# Patient Record
Sex: Female | Born: 1935 | Race: White | Hispanic: No | Smoking: Never smoker
Health system: Southern US, Community
[De-identification: ages and names within clinical notes are randomized; demographics above are authoritative.]

## PROBLEM LIST (undated history)

## (undated) DIAGNOSIS — I10 Essential (primary) hypertension: Secondary | ICD-10-CM

## (undated) DIAGNOSIS — M1711 Unilateral primary osteoarthritis, right knee: Secondary | ICD-10-CM

## (undated) DIAGNOSIS — M810 Age-related osteoporosis without current pathological fracture: Secondary | ICD-10-CM

## (undated) DIAGNOSIS — K219 Gastro-esophageal reflux disease without esophagitis: Secondary | ICD-10-CM

## (undated) DIAGNOSIS — M48061 Spinal stenosis, lumbar region without neurogenic claudication: Secondary | ICD-10-CM

## (undated) DIAGNOSIS — H409 Unspecified glaucoma: Secondary | ICD-10-CM

## (undated) DIAGNOSIS — F32A Depression, unspecified: Secondary | ICD-10-CM

## (undated) DIAGNOSIS — R42 Dizziness and giddiness: Secondary | ICD-10-CM

## (undated) DIAGNOSIS — F329 Major depressive disorder, single episode, unspecified: Secondary | ICD-10-CM

## (undated) HISTORY — PX: NECK SURGERY: SHX720

## (undated) HISTORY — PX: FRACTURE SURGERY: SHX138

## (undated) HISTORY — PX: EYE SURGERY: SHX253

## (undated) HISTORY — PX: HIP FRACTURE SURGERY: SHX118

## (undated) HISTORY — PX: ABDOMINAL HYSTERECTOMY: SHX81

---

## 1998-11-11 ENCOUNTER — Encounter: Payer: Self-pay | Admitting: Specialist

## 1998-11-14 ENCOUNTER — Ambulatory Visit (HOSPITAL_COMMUNITY): Admission: RE | Admit: 1998-11-14 | Discharge: 1998-11-14 | Payer: Self-pay | Admitting: Specialist

## 1999-05-15 ENCOUNTER — Other Ambulatory Visit: Admission: RE | Admit: 1999-05-15 | Discharge: 1999-05-15 | Payer: Self-pay | Admitting: Internal Medicine

## 2000-04-06 ENCOUNTER — Emergency Department (HOSPITAL_COMMUNITY): Admission: EM | Admit: 2000-04-06 | Discharge: 2000-04-06 | Payer: Self-pay | Admitting: Emergency Medicine

## 2000-04-06 ENCOUNTER — Encounter: Payer: Self-pay | Admitting: Emergency Medicine

## 2000-06-20 ENCOUNTER — Encounter: Admission: RE | Admit: 2000-06-20 | Discharge: 2000-09-18 | Payer: Self-pay | Admitting: Anesthesiology

## 2001-05-05 ENCOUNTER — Encounter: Payer: Self-pay | Admitting: Emergency Medicine

## 2001-05-05 ENCOUNTER — Encounter: Payer: Self-pay | Admitting: Internal Medicine

## 2001-05-05 ENCOUNTER — Inpatient Hospital Stay (HOSPITAL_COMMUNITY): Admission: EM | Admit: 2001-05-05 | Discharge: 2001-05-06 | Payer: Self-pay | Admitting: Emergency Medicine

## 2002-12-01 ENCOUNTER — Encounter: Payer: Self-pay | Admitting: Internal Medicine

## 2002-12-01 ENCOUNTER — Encounter: Admission: RE | Admit: 2002-12-01 | Discharge: 2002-12-01 | Payer: Self-pay | Admitting: Internal Medicine

## 2002-12-04 ENCOUNTER — Encounter: Payer: Self-pay | Admitting: Internal Medicine

## 2002-12-04 ENCOUNTER — Encounter: Admission: RE | Admit: 2002-12-04 | Discharge: 2002-12-04 | Payer: Self-pay | Admitting: Internal Medicine

## 2003-01-13 ENCOUNTER — Encounter: Payer: Self-pay | Admitting: General Surgery

## 2003-01-13 ENCOUNTER — Ambulatory Visit (HOSPITAL_COMMUNITY): Admission: RE | Admit: 2003-01-13 | Discharge: 2003-01-13 | Payer: Self-pay | Admitting: General Surgery

## 2003-01-27 ENCOUNTER — Encounter: Payer: Self-pay | Admitting: *Deleted

## 2003-01-27 ENCOUNTER — Encounter: Admission: RE | Admit: 2003-01-27 | Discharge: 2003-01-27 | Payer: Self-pay | Admitting: *Deleted

## 2003-09-04 ENCOUNTER — Inpatient Hospital Stay (HOSPITAL_COMMUNITY): Admission: EM | Admit: 2003-09-04 | Discharge: 2003-09-05 | Payer: Self-pay | Admitting: Emergency Medicine

## 2003-09-10 ENCOUNTER — Emergency Department (HOSPITAL_COMMUNITY): Admission: EM | Admit: 2003-09-10 | Discharge: 2003-09-11 | Payer: Self-pay | Admitting: *Deleted

## 2005-08-07 ENCOUNTER — Encounter: Admission: RE | Admit: 2005-08-07 | Discharge: 2005-08-07 | Payer: Self-pay | Admitting: Internal Medicine

## 2006-11-19 ENCOUNTER — Inpatient Hospital Stay (HOSPITAL_COMMUNITY): Admission: RE | Admit: 2006-11-19 | Discharge: 2006-11-23 | Payer: Self-pay | Admitting: Obstetrics and Gynecology

## 2006-11-19 ENCOUNTER — Encounter (INDEPENDENT_AMBULATORY_CARE_PROVIDER_SITE_OTHER): Payer: Self-pay | Admitting: Specialist

## 2006-11-25 ENCOUNTER — Observation Stay (HOSPITAL_COMMUNITY): Admission: AD | Admit: 2006-11-25 | Discharge: 2006-11-26 | Payer: Self-pay | Admitting: Obstetrics and Gynecology

## 2007-01-16 ENCOUNTER — Emergency Department (HOSPITAL_COMMUNITY): Admission: EM | Admit: 2007-01-16 | Discharge: 2007-01-16 | Payer: Self-pay | Admitting: Emergency Medicine

## 2008-01-27 ENCOUNTER — Ambulatory Visit: Payer: Self-pay | Admitting: Vascular Surgery

## 2008-03-31 ENCOUNTER — Ambulatory Visit: Payer: Self-pay | Admitting: Vascular Surgery

## 2008-09-29 ENCOUNTER — Ambulatory Visit: Payer: Self-pay | Admitting: Vascular Surgery

## 2009-01-06 ENCOUNTER — Encounter: Admission: RE | Admit: 2009-01-06 | Discharge: 2009-01-06 | Payer: Self-pay | Admitting: Internal Medicine

## 2009-02-01 ENCOUNTER — Ambulatory Visit (HOSPITAL_COMMUNITY): Admission: RE | Admit: 2009-02-01 | Discharge: 2009-02-01 | Payer: Self-pay | Admitting: Internal Medicine

## 2009-12-27 ENCOUNTER — Emergency Department (HOSPITAL_COMMUNITY)
Admission: EM | Admit: 2009-12-27 | Discharge: 2009-12-27 | Payer: Self-pay | Source: Home / Self Care | Admitting: Emergency Medicine

## 2010-07-12 ENCOUNTER — Inpatient Hospital Stay (HOSPITAL_COMMUNITY)
Admission: EM | Admit: 2010-07-12 | Discharge: 2010-07-17 | Payer: Self-pay | Source: Home / Self Care | Attending: Internal Medicine | Admitting: Internal Medicine

## 2010-07-12 LAB — BASIC METABOLIC PANEL
BUN: 13 mg/dL (ref 6–23)
CO2: 27 mEq/L (ref 19–32)
Calcium: 8.8 mg/dL (ref 8.4–10.5)
Chloride: 100 mEq/L (ref 96–112)
Creatinine, Ser: 0.81 mg/dL (ref 0.4–1.2)
GFR calc Af Amer: >60 mL/min (ref >60)
GFR calc non Af Amer: >60 mL/min (ref >60)
Glucose, Bld: 106 mg/dL — ABNORMAL HIGH (ref 70–99)
Potassium: 4.3 mEq/L (ref 3.5–5.1)
Sodium: 135 mEq/L (ref 135–145)

## 2010-07-12 LAB — CBC
HCT: 35 % — ABNORMAL LOW (ref 36.0–46.0)
Hemoglobin: 11.4 g/dL — ABNORMAL LOW (ref 12.0–15.0)
MCH: 30 pg (ref 26.0–34.0)
MCHC: 32.6 g/dL (ref 30.0–36.0)
MCV: 92.1 fL (ref 78.0–100.0)
Platelets: 273 10*3/uL (ref 150–400)
RBC: 3.8 MIL/uL — ABNORMAL LOW (ref 3.87–5.11)
RDW: 13.8 % (ref 11.5–15.5)
WBC: 6 10*3/uL (ref 4.0–10.5)

## 2010-07-12 LAB — URINALYSIS, ROUTINE W REFLEX MICROSCOPIC
Bilirubin Urine: NEGATIVE
Hemoglobin, Urine: NEGATIVE
Ketones, ur: NEGATIVE mg/dL
Nitrite: NEGATIVE
Protein, ur: NEGATIVE mg/dL
Specific Gravity, Urine: 1.007 (ref 1.005–1.030)
Urine Glucose, Fasting: NEGATIVE mg/dL
Urobilinogen, UA: 0.2 mg/dL (ref 0.0–1.0)
pH: 7.5 (ref 5.0–8.0)

## 2010-07-13 LAB — BASIC METABOLIC PANEL
BUN: 11 mg/dL (ref 6–23)
CO2: 26 mEq/L (ref 19–32)
Calcium: 8.8 mg/dL (ref 8.4–10.5)
Chloride: 104 mEq/L (ref 96–112)
Creatinine, Ser: 0.79 mg/dL (ref 0.4–1.2)
GFR calc Af Amer: >60 mL/min (ref >60)
GFR calc non Af Amer: >60 mL/min (ref >60)
Glucose, Bld: 101 mg/dL — ABNORMAL HIGH (ref 70–99)
Potassium: 4.1 mEq/L (ref 3.5–5.1)
Sodium: 137 mEq/L (ref 135–145)

## 2010-07-13 LAB — IRON AND TIBC
Iron: 30 ug/dL — ABNORMAL LOW (ref 42–135)
Saturation Ratios: 9 % — ABNORMAL LOW (ref 20–55)
TIBC: 333 ug/dL (ref 250–470)
UIBC: 303 ug/dL

## 2010-07-13 LAB — FOLATE: Folate: >20 ng/mL

## 2010-07-13 LAB — CBC
HCT: 34 % — ABNORMAL LOW (ref 36.0–46.0)
Hemoglobin: 10.8 g/dL — ABNORMAL LOW (ref 12.0–15.0)
MCH: 29.2 pg (ref 26.0–34.0)
MCHC: 31.8 g/dL (ref 30.0–36.0)
MCV: 91.9 fL (ref 78.0–100.0)
Platelets: 258 10*3/uL (ref 150–400)
RBC: 3.7 MIL/uL — ABNORMAL LOW (ref 3.87–5.11)
RDW: 13.6 % (ref 11.5–15.5)
WBC: 5.1 10*3/uL (ref 4.0–10.5)

## 2010-07-13 LAB — FERRITIN: Ferritin: 13 ng/mL (ref 10–291)

## 2010-07-13 LAB — VITAMIN B12: Vitamin B-12: 253 pg/mL (ref 211–911)

## 2010-07-30 ENCOUNTER — Encounter: Payer: Self-pay | Admitting: Internal Medicine

## 2010-09-24 LAB — CBC
HCT: 35.1 % — ABNORMAL LOW (ref 36.0–46.0)
Hemoglobin: 11.4 g/dL — ABNORMAL LOW (ref 12.0–15.0)
MCHC: 32.4 g/dL (ref 30.0–36.0)
MCV: 92.1 fL (ref 78.0–100.0)
RBC: 3.81 MIL/uL — ABNORMAL LOW (ref 3.87–5.11)
WBC: 7.4 10*3/uL (ref 4.0–10.5)

## 2010-09-24 LAB — DIFFERENTIAL
Eosinophils Absolute: 0.2 10*3/uL (ref 0.0–0.7)
Lymphs Abs: 1 10*3/uL (ref 0.7–4.0)
Monocytes Absolute: 0.6 10*3/uL (ref 0.1–1.0)
Monocytes Relative: 8 % (ref 3–12)
Neutrophils Relative %: 76 % (ref 43–77)

## 2010-09-24 LAB — BASIC METABOLIC PANEL
CO2: 28 mEq/L (ref 19–32)
Chloride: 104 mEq/L (ref 96–112)
GFR calc Af Amer: >60 mL/min (ref >60)
Potassium: 4.6 mEq/L (ref 3.5–5.1)
Sodium: 138 mEq/L (ref 135–145)

## 2010-11-21 NOTE — Assessment & Plan Note (Signed)
OFFICE VISIT   Yvette Carroll, Yvette Carroll  DOB:  1919/10/14                                       03/31/2008  ZOXWR#:[id number]   The patient is an 77 year old female sent by Dr. Leeanne Deed for evaluation  of lower extremity occlusive disease.  She states that recently her  right foot has been getting numb when she walks.  She also states that  her left calf hurts all the time.  This occurs whether it is at rest or  with ambulation.  She recently had her left heel injected by Dr. Leeanne Deed  for plantar fasciitis.  He has told her not to ambulate for a while as  this heals, and she can ride a bike instead.  Her atherosclerotic risk  factors include age, hypertension.  She denies any history of tobacco  abuse.   PAST SURGICAL HISTORY:  She had a hysterectomy, bladder tacking, colon  repair, and back surgeries.   She states she also has residual spinal stenosis.   MEDICATIONS:  Include Azopt, Lumigan, tramadol, Norvasc, Betimol,  Prilosec, Ultracet and Antivert.   She is allergic to penicillin which causes swelling.  Mycin causes  diarrhea and Betadine, which causes a rash.   Past medical history is as listed above.   FAMILY HISTORY:  Unremarkable.   SOCIAL HISTORY:  She is widowed has two children.  She is a nonsmoker  not consume alcohol.   REVIEW OF SYSTEMS:  She is 5 feet, 136 pounds.  CARDIAC:  She cannot climb 2 flights of stairs without becoming short of  breath.  PULMONARY:  She has some seasonal allergies.  GI:  She has irritable bowel syndrome and chronic abdominal pain.  GU:  Unremarkable.  VASCULAR:  She denies history stroke or TIA.  NEUROLOGIC:  She has occasional dizziness.  ORTHOPEDIC:  She has multiple joint arthritis.  Psychiatric, hematologic are negative.  ENT:  She has never had a glaucoma.   Of note, she is also [facility] for her  spinal stenosis.   PHYSICAL EXAM:  Blood pressure 139/77 in the left arm,  heart rate 76 and  regular.  HEENT:  Unremarkable.  She has 2+ carotid pulses without  bruit.  Chest:  Clear to auscultation.  Cardiac exam is regular rate  rhythm without murmur.  Abdomen is soft, nontender, nondistended with no  masses.  Extremities:  She has 2+ carotid, radial, femoral, popliteal  pulses bilaterally.  In the left leg, she has a 2+ left posterior tibial  pulse with absent dorsalis pedis pulse.  In the right foot, she has  absent dorsalis pedis and posterior tibial pulse.   On July 21, this showed an ABI on the right side of 0.93 and on the left  of 0.96.   In summary, the patient does have some mild evidence of arterial  occlusive disease of the right leg with absent pedal pulses.  However,  her ABIs are 0.9 on this side and this should be adequate perfusion to  not put her at any risk of limb loss.  She has no open wounds on the  foot currently.  She currently is limited more by her degenerative joint  disease than by her vascular occlusive disease as far as walking and  lifestyle is concerned.  I believe the best option for  her is continued  risk factor management and observation.  She will follow up for repeat  ABIs in 6 months' time.   Janetta Hora. Fields, MD  Electronically Signed   CEF/MEDQ  D:  04/01/2008  T:  04/01/2008  Job:  [id number]   cc:   Fanny Bien. Tuchman, D.P.M.

## 2010-11-21 NOTE — Op Note (Signed)
NAME:  Yvette Carroll, Yvette Carroll NO.:  [id number]   MEDICAL RECORD NO.:  [id number]          PATIENT TYPE:  INP   LOCATION:  9307                          FACILITY:  [facility]   PHYSICIAN:  Michelle L. Grewal, M.D.DATE OF BIRTH:  04/10/1920   DATE OF PROCEDURE:  11/19/2006  DATE OF DISCHARGE:  11/23/2006                               OPERATIVE REPORT   PREOPERATIVE DIAGNOSES:  1. Pelvic relaxation.  2. Stress urinary incontinence.   POSTOPERATIVE DIAGNOSES:  1. Pelvic relaxation.  2. Stress urinary incontinence.   PROCEDURES:  1. Total vaginal hysterectomy.  2. Anterior repair.  3. Replacement of graft.  4. Cystoscopy.  5. Posterior repair.  6. Sacrospinous ligament fixation of vaginal vault.   SURGEONS:  Dr. Marcelle Overlie and Dr. Alfredo Martinez.   ESTIMATED BLOOD LOSS:  300 mL.   PATHOLOGY:  Uterus and cervix.   COMPLICATIONS:  None.   PACKS AND DRAINS:  Foley and vaginal packing.   PROCEDURE:  The patient was taken to the operating room after informed  consent was obtained.  She was then prepped and draped in the usual  sterile fashion.  A Foley catheter was inserted.  Exam under anesthesia  revealed a grade III uterine prolapse with significant cystocele and  rectocele. Uterus was small.  A weighted speculum was placed in the  vagina.  A circumferential incision was made around the cervix and the  posterior cul-de-sac was entered sharply using Mayo scissors.  The  anterior cul-de-sac was entered using Metzenbaum scissors as well.  We  then placed curved Heaney clamps across the uterosacral cardinal  ligaments on either side.  Each pedicle was cut and suture ligated using  0 Vicryl suture.  Once we reached the level of the triple pedicle.  The  uterus was retroflexed and the broad ligament was clamped on either side  using curved Heaney clamps.  The specimen was removed.  The pedicles  were secured using a free tie of 0 Vicryl suture and a suture ligature  of 0 Vicryl suture.  There was no bleeding whatsoever.  I then placed a  McCall cul-de-sac stitch in the usual fashion using 0 Vicryl suture.  We  then closed the posterior two thirds of the cuff in a running stitch  using 2-0 Vicryl suture.  At this point, Dr. Sherron Monday became the  primary surgeon and I assisted him and he performed briefly an anterior  repair, replacement of the graft and cystoscopy.  This portion of the  surgery will be dictated by him.  At the end of that, he then closed  with the vaginal cuff completely.  At this point, we then switched  placed it became the primary surgeon for the posterior repair and  sacrospinous ligament fixation where a V-shaped incision was made in the  perineum, a midline incision was made all the way up the posterior wall  of the vagina and the rectocele was reduced using sharp and blunt  dissection.  The rectocele was reduced using 0 Vicryl interrupted.  I  also used the Capio device with Ethibond  and then placed a stitch  through the right sacrospinous ligament and fixated it to the vaginal  vault with excellent placement.  We then trimmed off the excessive  vaginal epithelium and then I closed the posterior vaginal wall.  After  closing the distal third, I then tied down the sacrospinous stitch with  excellent fixation of the vaginal vault and excellent suspension.  I  then closed the remainder of the vaginal wall using 0 Vicryl suture and  closed the perineum was well.  At the end of the procedures, packing  with Estrace cream was inserted into the vagina.  All sponge, lap and  instrument counts were correct x2.  The patient went to recovery room in  stable condition.   ANESTHESIA:  Spinal.      Michelle L. Vincente Poli, M.D.  Electronically Signed     MLG/MEDQ  D:  12/03/2006  T:  12/03/2006  Job:  [id number]   cc:   Martina Sinner, MD  Fax: [phone]

## 2010-11-21 NOTE — Op Note (Signed)
NAME:  Yvette Carroll, Yvette Carroll NO.:  [id number]   MEDICAL RECORD NO.:  [id number]          PATIENT TYPE:  AMB   LOCATION:  SDC                           FACILITY:  [facility]   PHYSICIAN:  Martina Sinner, MD DATE OF BIRTH:  1920/01/06   DATE OF PROCEDURE:  11/19/2006  DATE OF DISCHARGE:                               OPERATIVE REPORT   PREOPERATIVE DIAGNOSIS:  Ureteral vaginal prolapse; cystocele;  rectocele.   SURGERY:  Cystocele repair plus four-corner graft plus cystoscopy (Dr.  Lorin Picket MacDiarmid; assistant Dr. Vincente Poli); transvaginal hysterectomy plus  posterior repair plus sacrospinous vault suspension (Dr. Marcelle Overlie; assistant Dr. Sherron Monday).    [name] has vault prolapse to a mild to moderate degree with a  symptomatic cystocele and a mild posterior defect.   She was prepped and draped in the usual fashion under spinal anesthetic.  Preoperative laboratory work was within normal limits.   The patient was given preoperative antibiotics.   Transvaginal hysterectomy was performed by Dr. Marcelle Overlie, and this  will be dictated by her.  The following the hysterectomy, there was no  bleeding.  I placed two Allis clamps at the cuff just lateral to the  midline anteriorly.  I instilled approximately 18 mL of a lidocaine  epinephrine mixture.  I made a long midline incision between Allis  clamps and dissected the pubocervical fascia from the vaginal wall to  the white line bilaterally.  I was careful not to hypermobilize the dome  of the bladder.  I did a two-layer anterior repair with running 2-0  Vicryl on a SH needle.  I then cystoscoped the patient.  There was  efflux of indigo carmine dye from both ureteral orifices, and there was  no change in distortion of the anatomy.   I then did a four-corner repair by placing a 2-0 Vicryl on UR6 near the  urethrovesical angle and the pelvic sidewall.  I tried several times  with a Capio device to place it on  the ischial spine which was easily  identified with blunt and sharp dissection, but I was not able to make  it fire.  For this reason, I used UR6 on a 2-0 Vicryl and placed it into  the ileal coccygeus muscle approximately 1 cm below and 1 to 1.5 cm  caudal to the ischial spine.  I was happy with the placement of the 4  sutures.   I then cut a 10 x 6 dermal graft to approximately 8 x 8 cm x 4-cm  trapezoid.  This was sewn into the 4 sutures placed above.  It fit  nicely and supported the graft.  I did not perform a sling.  I trimmed a  few millimeters of vaginal mucosa bilaterally and closed the vaginal  wall anteriorly with running 2-0 Vicryl on a CT-1 needle.  Estimated  blood loss was less than 30 mL.   Dr. Marcelle Overlie then did a cul-de-plasty plus a sacrospinous  fixation and posterior repair.   Vaginal pack was inserted.  Estimated blood loss was less than 100  mL.  Leg position was good at the end of the case.   Catheter was draining blue urine at the end of the case.   At the of the case, Yvette Carroll vaginal length was good.  She did not  have a lot of narrowing.  She does have a narrow pubic bone.  Hopefully,  these procedures will reach her treatment goal, and overall, we were  both very pleased with the operation.           ______________________________  Martina Sinner, MD  Electronically Signed     SAM/MEDQ  D:  11/19/2006  T:  11/19/2006  Job:  [id number]

## 2010-11-21 NOTE — Procedures (Signed)
EEG NUMBER:  [id number]   HISTORY:  This is an 77 year old patient with history of dizziness,  blacking out episodes, being forgetful, balance issues, and vertigo.  The patient is being evaluated for the above.  This is a routine EEG.  No skull defects noted.   MEDICATIONS:  Plavix and eye drops of some sort.   EEG CLASSIFICATION:  Dysrhythmia grade 1, generalized.   DESCRIPTION OF RECORDING:  Background rhythm of this recording consists  of a somewhat poorly modulated low-amplitude 7-Hz background activity.  As the record progresses, significant degree of muscle artifact that  seems to overlie most of the recording.  Photic stimulation is performed  and results in a symmetric photic drive response.  Hyperventilation is  also performed resulting in a minimal buildup background activity  without significant slowing seen.  At no time during the recording,  there appeared to be evidence of spike, spike wave discharges, or  evidence of focal slowing.  EKG monitor shows no evidence of cardiac  rhythm abnormalities with a heart rate of 90.   IMPRESSION:  This is a minimally abnormal EEG recording due to mild  symmetric background slowing.  This is a nonspecific recording and can  be seen with any process of results in a mild toxic or metabolic  encephalopathy or any dementing-type illness.  No epileptiform  discharges were seen at any time.      Marlan Palau, M.D.  Electronically Signed     NFA:OZHY  D:  02/01/2009 14:17:44  T:  02/02/2009 03:28:41  Job #:  [id number]

## 2010-11-24 NOTE — Op Note (Signed)
[facility]  Patient:    Yvette Carroll, Yvette Carroll                           MRN: [id number] Proc. Date: 06/20/00 Attending:  Thyra Carroll, M.D.                           Operative Report  DATE OF BIRTH:  [birth year]  DIAGNOSIS:  Lumbar spinal stenosis with underlying lumbar spondylosis and degenerative disk disease with spinal stenosis and encroachment on the right L4 nerve root on MRI and left L5.  PROCEDURE:  Lumbar epidural steroid injection.  ANESTHESIOLOGIST:  Yvette Carroll, M.D.  HISTORY:  Yvette Carroll is a very pleasant 77 year old who was sent to [place] by Dr. Onalee Carroll for a series of lumbar epidural steroid injections.  The patient states that she was in her usual state of health which was characterized by recurrent back pain for several years but over the past two to three years she has had progressively increasing discomfort radiating out into the right lower extremity.  Approximately two to three weeks ago it became severe in intensity.  She came under Dr. Brunetta Carroll care after an injury to her left foot five weeks ago which required surgical intervention four weeks ago.  She is recovering nicely from this.  Since then, she has had sharp intermittent pain radiating from the lower back, at approximately the right L5-S1 facet joint region out into the lateral aspect of her right calf and thigh to her foot.  It is associated with numbness and tingling but no focal weakness and no bowel or bladder incontinence.  It is made worse by standing, bending and vacuuming and improved by walking.  Heat improves it also.  She has been treated with Mobic.  An MRI was obtained at [facility] on May 20, 2000 which showed lumbar spinal stenosis moderate at L4-5 with multilevel facet joint arthropathy, disk bulges and to a lesser extent a herniated disk at L5-S1.  She is sent for a series of epidural steroid injections at this time.  CURRENT MEDICATIONS:   Mobic, Norvasc, ______, Timoptic and Pepcid.  ALLERGIES:  BETADINE, PENICILLIN, and MYCINS.  FAMILY HISTORY:  Positive for cancer.  Graves disease.  Huntingtons chorea.  ACTIVE MEDICAL PROBLEMS:  Hypertension, glaucoma, osteoarthritis, recurrent vertigo with tinnitus, gastroesophageal reflux disease and allergic rhinitis.  PAST SURGICAL HISTORY:  Cervical disk surgery with fusion by Dr. Barnett Carroll and repair of foot.  SOCIAL HISTORY:  The patient is a nonsmoker, nondrinker.  She was employed in [employer] and retired four years ago.  REVIEW OF SYSTEMS:  General: Significant for cold intolerance, otherwise negative.  Head:  Negative.  Eyes significant for glaucoma and cataract surgery.  She currently is having some difficulties with her right eye.  She is to seen an eye physician next week.  ENT: Significant for dry mouth.  Ears significant for vertigo and ringing in the ears.  Pulmonary negative. Cardiovascular:  Significant for a heart murmur and chronic hypertension. Gastrointestinal:  Significant for gastroesophageal reflux disease when she takes the Pepcid or Zantac.  Genitourinary significant for frequency but no burning, fevers, kidney stones or hematuria.   Musculoskeletal/neurological: See HPI.  No history of seizures or strokes.  Cutaneous:  Significant for dry skin.  Hematologic negative.  Endocrine significant for previous history of taking thyroid replacement, currently with some cold intolerance.  Psychiatric significant for some  mild seasonal blues.  Allergies:  The patient used to take allergy shots for 15 years but has recently gone off those.  She has allergies to pollens.  PHYSICAL EXAMINATION:  VITAL SIGNS:  Blood pressure 143/65, heart rate 82, respirations 18, O2 saturation 98%.  Her pain level is 4/10.  GENERAL:  This is a very pleasant female who looks her stated age.  HEENT:  Normocephalic, atraumatic.  Eyes: Extraocular movements intact with conjunctivae  and sclerae clear.  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  Nose patent.  Ears patent.  Oropharynx demonstrated good dentition.  NECK:  The neck demonstrated fairly marked restriction in range of motion of her neck with carotids 2+ and symmetric without bruits.  There is no lymphadenopathy.  LUNGS:  Clear.  HEART:  Regular rate and rhythm with a grade 2/6 systolic/decrescendo murmur.  BREASTS/PELVIC/RECTAL/ABDOMEN:  Exams not performed.  BACK:  Exam revealed tenderness over the right S1 joint region with mildly positive straight leg raise sign on the right side.  EXTREMITIES:  The patient demonstrated a healing surgical scar of the left foot.  She had some bony enlargement of the DIPs, PIPs, and first carpal/metacarpal joints as well as the first MTPs of her feet.  Radial pulses and dorsalis pedis pulses are 1-2+ and symmetrical.  NEUROLOGICAL:  The patient was oriented x 4.  Cranial nerves II-XII were grossly intact.  Deep tendon reflexes were symmetric in the upper and lower extremity with downgoing toes.  There is no evidence of clonus.  Motor was 5/5 with symmetric bulk and tone.  Coordination was grossly intact.  Sensation to vibratory sense was attenuated bilaterally in the lower extremities.  Pin prick was globally decreased in a nondermatomal distribution in the right lower extremity.  IMPRESSION: 1. Low back pain which is intermittent and progressive, chronically on the    basis of her lumbar spondylosis with more recent symptom probably    reflective of her right L4 radiculopathy.  She has minimal    pseudoclaudication. 2. Other medical problems per Dr. Lendell Carroll which include hypertension,    osteoarthritis, vertigo, tinnitus, gastroesophageal reflux disease,    and allergic rhinitis. 3. Glaucoma/cataracts per eye physician.  DISPOSITION:  I discussed the potential risks, benefits and limitations of a lumbar epidural steroid injection in detail with the  patient.  I reviewed the side effects of corticosteroids with her in detail.  She wishes to proceed.   DESCRIPTION OF PROCEDURE:  After informed consent was obtained, the patient was placed in the sitting position and monitored.  Her back was prepped with Betadine x 3.  A skin wheal was raised at the L4-5 interspace with 1% lidocaine.  A 20 gauge Tuohy needle was introduced through the lumbar epidural space to a loss of resistance to preservative free normal saline. There was no CSF nor blood.  Then 40 mg of Medrol and 5 ml of preservative free normal saline was gently injected.  The needle was flushed with preservative free normal saline and removed intact.  Postprocedure condition:  Stable.  DISCHARGE INSTRUCTIONS: 1. Resume previous diet. 2. Limitation of activities per instruction sheet. 3. Continue on current medications. 4. Follow up with me next week for repeat injection. DD:  06/20/00 TD:  06/20/00 Job: [id number]

## 2010-11-24 NOTE — Consult Note (Signed)
NAME:  Yvette Carroll, Yvette Carroll                            ACCOUNT NO.:  [id number]   MEDICAL RECORD NO.:  [id number]                   PATIENT TYPE:  EMS   LOCATION:  ED                                   [facility]   PHYSICIAN:  Renato Battles, M.D.                  DATE OF BIRTH:  1920-04-20   DATE OF CONSULTATION:  09/11/2003  DATE OF DISCHARGE:  09/11/2003                                   CONSULTATION   HISTORY OF PRESENT ILLNESS:  The patient is an 77 year old white female with  generalized abdominal pain.  No nausea or vomiting.  The patient reports  having diarrhea for the last week or so.  She took Imodium two days ago and  had no bowel movement since.  The patient was admitted for similar  complaints, and had negative abdominal CT scan except for diverticulosis.  Discharged five days ago.   REVIEW OF SYSTEMS:  CONSTITUTIONAL:  No fever, chills, or night sweats.  Positive for vertigo.  CARDIAC:  No chest pain, shortness of breath, or  cough.  GASTROINTESTINAL:  Positive for constipation.  No nausea or  vomiting.  GENITOURINARY:  No hematuria, dysuria, or urinary retention.   PAST MEDICAL HISTORY:  1. Hypertension.  2. Glaucoma.  3. Mild gastroparesis.  4. __________.   FAMILY HISTORY:  Positive for breast cancer and diabetes.   SOCIAL HISTORY:  Negative.   ALLERGIES:  1. MYCINS.  2. PENICILLIN.   MEDICATIONS:  1. Amlodipine.  2. __________.  3. Antivert.   PHYSICAL EXAMINATION:  GENERAL:  Alert and oriented x3.  In no acute  distress.  VITAL SIGNS:  Temperature 98, heart rate 91, respiratory rate 18, blood  pressure 113/60.  HEENT:  Head is normocephalic, atraumatic.  Pupils equal, round, reactive to  light and accommodation.  NECK:  No lymphadenopathy, no thyromegaly, no JVD.  CHEST:  Clear to auscultation bilaterally.  No wheezes, rhonchi, or rales.  HEART:  Regular rate and rhythm.  No murmurs, rubs, or gallops.  ABDOMEN:  Soft, mild generalized tenderness.  No  distention, decreased bowel  sounds.  EXTREMITIES:  No cyanosis, clubbing, or edema.  NEUROLOGIC:  Negative.   LABORATORY DATA:  Glucose 125, otherwise electrolytes were normal.  Liver  function tests were normal.  CBC showed no abnormalities.  Abdominal acute  series was negative with increased stools.   ASSESSMENT AND PLAN:  This is most likely abdominal pain secondary to  constipation.  No impaction.  Plan to discharge the patient to home with a  few days of Colace.  Additionally, she was recommended to start on Metamucil  on a daily basis, otherwise, we are not going to change her medications.   PRIMARY CARE PHYSICIAN:  Janae Bridgeman. Lendell Caprice, M.D.  Renato Battles, M.D.    SA/MEDQ  D:  09/11/2003  T:  09/11/2003  Job:  [id number]

## 2010-11-24 NOTE — H&P (Signed)
NAME:  Yvette Carroll, Yvette Carroll                            ACCOUNT NO.:  [id number]   MEDICAL RECORD NO.:  [id number]                   PATIENT TYPE:  EMS   LOCATION:  ED                                   FACILITY:  [facility]   PHYSICIAN:  Luis A. Delaney Meigs, M.D.                DATE OF BIRTH:  01-May-1920   DATE OF ADMISSION:  09/04/2003  DATE OF DISCHARGE:                                HISTORY & PHYSICAL   CHIEF COMPLAINT:  Vomiting, abdominal cramps.   HISTORY OF PRESENT ILLNESS:  This is a delightful 77 year old white female  with a past medical history of __________ glaucoma and hypertension who  presents with about 24 hours of abdominal cramping, vomiting and more  recently diarrhea. The patient states that approximately one week prior to  admission, she developed symptoms of an upper respiratory tract infection  consisting of productive cough, sore throat. For this she was prescribed  levofloxacin by and outside physician. Symptoms of upper respiratory tract  infection have since resolved.   REVIEW OF SYMPTOMS:  As in HPI.  PULMONARY:  Some shortness of breath and  wheezing, upper respiratory tract infection otherwise negative.  CARDIAC:  No chest pain, no palpitations, no dyspnea on exertion.  ENDOCRINE:  No  polyuria, polydipsia, no heat or cold intolerance.  MUSCULOSKELETAL:  Complains to chronic back and neck pain.  GASTROINTESTINAL:  As in history  of present illness.  Denies hematochezia, melena or hematemesis.  PSYCHIATRIC:  Denies hallucinations, suicidal or homicidal ideation.  NEUROLOGIC:  Denies syncope or presyncope but admits to some chronic  vertigo.   PAST MEDICAL HISTORY:  As in history of present illness.  Osteoarthritis of  the back and neck.  Probable delayed gastric emptying based on gastric  emptying study of January 13, 2003.   FAMILY HISTORY:  Positive for breast cancer and diabetes mellitus.   ALLERGIES:  The patient states she is allergic to [facility] and PENICILLIN.   SOCIAL HISTORY:  The patient lives alone, denies tobacco, alcohol or drug  use.   CURRENT MEDICATIONS:  Three types of eye drops of which she is uncertain of  the names for glaucoma, __________ levofloxacin, amlodipine and Ultracet  p.r.n. for back pain.   PHYSICAL EXAMINATION:  GENERAL:  Reveals an elderly white female in no acute  distress.  VITAL SIGNS:  Blood pressure 116/66 lying, systolic blood pressure drops to  104 on sitting.  Heart rate is 70 beats per minute lying and increases to 95  upon sitting. Temperature is 97.  The patient is sating 100% room air.  HEENT:  Reveals no scleral icterus.  Oral examination is unremarkable.  Pharynx is without erythema or exudate. Oral mucosa appears dry.  Neck  reveals no JVD, no lymphadenopathy, no thyroid masses and no tracheal  deviation.  BREASTS:  Reveals no lymphadenopathy, no nipple discharge, no masses.  No  pau d'orange.  CARDIOVASCULAR:  Reveals a soft systolic ejection murmur best heard at the  right upper sternal border.  No gallops or rubs are auscultated.  LUNGS:  Clear to auscultation bilaterally.  No signs of consolidation noted.  ABDOMEN:  Soft with normal active bowel sounds.  RECTAL:  Reveals an external hemorrhoid but stool is guaiac negative.  EXTREMITIES:  Reveal no cyanosis, clubbing or edema.  NEUROLOGIC:  Without sensory or motor deficit.  Cranial nerves were grossly  intact.   DIAGNOSTIC STUDIES:  CT of the abdomen reveals diverticulosis without the  presence of diverticulitis.  CBC with DIF reveals a white count of 16.6 with  an H&H of 14 and 40, MCV of 90, platelets 360.  There is reported greater  than 20% bands.  Complete metabolic panel reveals a sodium of 136, potassium  3.8, chloride 106, bicarbonate of 26, glucose of 152, BUN 15, creatinine  0.8, calcium 8.7, total protein 6.8, albumin 4, AST 24, ALT 15.  Alkaline  phosphatase 102, total bilirubin 0.6.  Urinalysis is negative.   ASSESSMENT/PLAN:  1.  An 77 year old white female with above medical history and presenting     complaints. The patient's symptoms raise concern for clostridium     difficile given the history of recent antibiotic or antimicrobial     exposure and abdominal complaints, diarrhea and leukocytosis.  Will send     stool for C Dif and fecal leukocytes and treat appropriately if positive.     Will also obtain blood cultures although patient is not clinically     showing signs of infection.  2. Volume depletion.  The patient is mildly orthostatic. Will replete volume     at 100 mL an hour normal saline.  Will also use metoclopramide low doses     as an antiemetic.  3. Increase random glucose.  The patient probably has impaired glucose     tolerance if not incipient over diabetes given the patient's family     history and random glucose of 152 mg per deciliter. Will check hemoglobin     A1C, fasting lipid panel and check blood glucose a.c. and h.s.  4. Glaucoma.  I have spoken to the patient's daughter on the phone and have     requested for her to bring her glaucoma eye drops as she is unable to     recall which ones she is currently on.  She will continue home regimen of     these.  5. Hypertension.  The patient currently volume depleted and orthostatic.     Will hold amlodipine for the time being.  6. Health maintenance.  The patient admits to not being compliant with     routine screening mammogram given the patient's family history of breast     cancer.  Clinical exam appears negative but I have strongly encouraged     her to followup with her primary care physician for routine mammogram     screening.                                               Luis A. Delaney Meigs, M.D.    LAT/MEDQ  D:  09/04/2003  T:  09/04/2003  Job:  [id number]

## 2010-11-24 NOTE — H&P (Signed)
[facility]  Patient:    Yvette Carroll, Yvette Carroll Visit Number: [id number] MRN: [id number]          Service Type: MED Location: 3W [phone] Attending Physician:  Vashti Hey Dictated by:   Janae Bridgeman Eloise Harman., M.D. Admit Date:  05/05/2001 Discharge Date: 05/06/2001                           History and Physical  INCOMPLETE  CHIEF COMPLAINT:  "Im real dizzy."  HISTORY OF PRESENT ILLNESS:  This 77 year old white female presented to the emergency room with acute onset of vertigo with nausea.  She had no significant headache.  No head injury.  Because of her unsteady situation, her daughter had become quite alarmed and has insisted on hospitalization as she has no one at home to care for her.  The daughter works in the labor and delivery department at [employer].  PAST MEDICAL HISTORY:  The patient Dictated by:   Janae Bridgeman. Eloise Harman., M.D. Attending Physician:  Vashti Hey DD:  05/19/01 TD:  05/19/01 Job: [id number]

## 2010-11-24 NOTE — Procedures (Signed)
[facility]  Patient:    Yvette Carroll, Yvette Carroll                         MRN: [id number] Proc. Date: 06/28/00 Adm. Date:  [id number] Attending:  Thyra Breed CC:         Elisha Ponder, M.D.   Procedure Report  PROCEDURE:  Lumbar epidural steroid injection.  DIAGNOSES:  Lumbar spondylosis with underlying spondylosis and degenerative disk disease.  INTERVAL HISTORY:  The patient has noted pretty marked improvement.  She is interested in another injection today.   injection.  PHYSICAL EXAMINATION:  Blood pressure 147/638, heart rate 87, respiratory rate 17, O2 saturations 96%.  She shows good healing from previous injection site.  DESCRIPTION OF PROCEDURE:  After informed consent was obtained, the patient was placed in a sitting position and monitored.  Her back was initially started out to prep with  Betadine, but she quickly alerted [place] to the fact that she does have a contact reaction to Betadine, and this was quickly cleansed off with copious amounts of alcohol.  There was no evidence of irritation of the skin.  We went ahead and prepped her out with alcohol.  The L5-S1 interspace was anesthetized with 1% lidocaine.  A 20 gauge Tuohy needle was introduced to the lumbar epidural space to loss of resistance to preservative-free normal saline.  There was no CSF nor blood.  Medrol 80 mg in 8 mL of preservative-free normal saline was gently injected.  The needle was flushed with preservative-free normal saline and removed intact.  Her back was cleansed with soap and water, and a Band-Aid placed.  Postprocedure condition - stable.  DISCHARGE INSTRUCTIONS: 1. Resume previous diet. 2. Limitations on activities per instruction sheet. 3. Continue on current medications. 4. Follow up with me in one week for a repeat injection. DD:  06/28/00 TD:  06/30/00 Job: [id number]

## 2010-11-24 NOTE — Discharge Summary (Signed)
NAME:  Yvette Carroll, Yvette Carroll NO.:  [id number]   MEDICAL RECORD NO.:  [id number]          PATIENT TYPE:  INP   LOCATION:  9307                          FACILITY:  [facility]   PHYSICIAN:  Michelle L. Grewal, M.D.DATE OF BIRTH:  [birth year]   DATE OF ADMISSION:  11/19/2006  DATE OF DISCHARGE:  11/23/2006                               DISCHARGE SUMMARY   ADMISSION DIAGNOSIS:  Pelvic prolapse.   DISCHARGE DIAGNOSIS:  Pelvic prolapse.  Status post TVH, AP repair,  sacrospinous ligament fixation and placement of the graft by Dr.  Sherron Monday.   HOSPITAL COURSE:  The patient is an 77 year old female who underwent the  above procedure on the day of admission. She did well after surgery and  by postop day #1 she was doing well.  She was ambulating and she was  using sitz baths.  She had minimal pain. She did have some leakage of  urine after removal of Foley catheter but this was felt due to some  swelling of the bladder and bladder spasm.  She did have a significant  anterior repair performed by Dr. Sherron Monday. Because of her age and  because of limited mobility, we kept her until postop day #4. She was  discharged home in good condition at that time. She will follow up in  the office in 1 week. Her vital signs were stable.  She was given  ibuprofen to take as needed for pain.  Her pain was minimal.  She was  advised to call is she has any nausea, vomiting, temperature greater  than 100.5 or severe abdominal pain.  The patient will follow up in the  office in 1 week.  She is advised no driving for 2 weeks and no heavy  lifting for 4-6-weeks.      Michelle L. Vincente Poli, M.D.  Electronically Signed     MLG/MEDQ  D:  01/27/2007  T:  01/27/2007  Job:  [id number]

## 2010-11-24 NOTE — Op Note (Signed)
[facility]  Patient:    Yvette Carroll, Yvette Carroll                           MRN: [id number] Attending:  Elisha Ponder, M.D. CC:         Smitty Cords. Beverely Pace, M.D.  Lowella Fairy, M.D.   Operative Report  Yvette Carroll is a very pleasant 77 year old female who was referred kindly by Dr. Carren Rang in regards to an injury to her left foot.  This patient fell today while putting some objects up.  She sustained a gouging injury to her left foot and also landed on her bottom.  The patient denies any numbness or tingling in the lower extremities.  She denies any upper extremity pain.  She states her neck and back have been slightly sore but denies any focal abnormality.  The patient was seen by Dr. Lora Havens and referred secondary to the gouging injury to her left foot with exposed tendon.  I have discussed her care and past medical history with her at length.  She denies any loss of consciousness or abdominal pain or chest pain.  ALLERGIES:  Betadine and penicillin.  MEDICATIONS:  Norvasc, Ultram, glaucoma medicines and Mobic.  PAST SURGICAL HISTORY:  C-spine surgery two to three years ago.  PAST MEDICAL HISTORY:  Glaucoma.  Hypertension.  History of low back pain.  SOCIAL HISTORY:  She does not smoke.  She lives alone.  Her regular physician is Dr. Lyman Bishop.  PHYSICAL EXAMINATION:  Pleasant white female, alert and oriented in no acute distress.  Patient has normal cervical range of motion which is nontender and there is no palpable deformity over her C, T or L spine.  Her upper extremities are atraumatic and neurovascularly intact.  I should note she was given a tetanus shot in the left upper extremity.  Her pelvis is stable, abdomen is soft.  Her right lower extremity is atraumatic.  Left lower extremity has a gouge injury to the left foot.  She is noted to have normal quadriceps function.  There are no signs of hip or knee fracture.  The patients injury about  the left lateral foot does show exposed extensor tendon and a very jagged and stellate laceration.  X-rays show degenerative changes in her lower lumbar spine with rotatory scoliosis and no subluxation or dislocation.  Her pelvis is negative.  Her foot shows no acute findings and positive degenerative changes.  IMPRESSION:  Gouging injury to the left foot with exposed EDC tendon that has been lacerated with a dropped small foot posture.  PLAN:  I verbally consented her for I&D and repair of structure if necessary.  DESCRIPTION OF PROCEDURE:  Patient was given a field block in the form of lidocaine without epinephrine.  She then underwent a sterile scrub with Hibiclens.  Following this she was draped sterilely and underwent I&D of the wound.  This was an excisional debridement cleaning skin, subcutaneous and muscle.  Following this the extensor tendon ends were dissected under 4.7 Loupe magnification and repaired with 4-0 Mersilene.  This was a six stranded repair.  The patient had excellent apposition.  There was no bunching of the tendon or other abnormality.  Once the repair was accomplished the patient then underwent sterile placement of bandage after the wound was approximated with 4-0 nylon.  There was a stellate laceration but did close nicely and the skin edges did have good refill, I should  note.  I asked her not to  move her small toe.  I then placed her in a posterior plaster splint with the ankle in slight dorsiflexion and toes in extension.  She tolerated this well without difficulty.  She was given Robaxin, Vicodin, and Keflex for five days to be taken as directed.  She will return to see me in 10 days for a wound check and follow-up.  All questions have been encouraged and answered.  It was a pleasure to see her today. DD:  04/06/00 TD:  04/06/00 Job: [id number]

## 2010-11-24 NOTE — Procedures (Signed)
[facility]  Patient:    Yvette Carroll, Yvette Carroll                         MRN: [id number] Proc. Date: 07/04/00 Adm. Date:  [id number] Attending:  Thyra Breed CC:         Dominica Severin III, M.D.   Procedure Report  PROCEDURE:  Lumbar epidural steroid injection.  DIAGNOSIS:  Lumbar spondylosis with spinal stenosis.  INTERVAL HISTORY:  The patients noted mild improvement after her second injection. She did feel as though the first injection had a much better impact with regard to her pain control. Nevertheless, she wishes to proceed with this injection today.  PHYSICAL EXAMINATION:  Blood pressure 137/6, heart rate 78, respiratory rate 20, O2 saturations 98%, pain level 4/10, temperature is 97.2. Her back shows good healing from a previous injection site.  DESCRIPTION OF PROCEDURE:  After informed consent was obtained, the patient was placed in the sitting position and monitored. The patients back was prepped with alcohol x 3 which was allowed to dry. A skin wheal was raised at the L4-5 interspace with 1 percent lidocaine. A 20 gauge Tuohy needle was introduced to the lumbar epidural space to loss of resistance to preservative free normal saline. There was no cerebrospinal fluid nor blood. 80 mg of Medrol and 5 ml of preservative free normal saline was gently injected. The needle was flushed with preservative free normal saline and removed intact.  CONDITION POST PROCEDURE:  Stable.  DISCHARGE INSTRUCTIONS:  Resume previous diet. Limitations in activities per instruction sheet. Continue on current medications. The patient plans to follow-up with Dr. Amanda Pea. DD:  07/04/00 TD:  07/04/00 Job: [id number]

## 2010-11-24 NOTE — Discharge Summary (Signed)
[facility]  Patient:    Yvette Carroll, Yvette Carroll Visit Number: [id number] MRN: [id number]          Service Type: MED Location: 3W [phone] Attending Physician:  Vashti Hey Dictated by:   Janae Bridgeman Eloise Harman., M.D. Admit Date:  05/05/2001 Discharge Date: 05/06/2001                             Discharge Summary  DATE OF BIRTH:  [birth year].  FINAL DIAGNOSES: 1. Acute vertigo. 2. History of hypertension. 3. Glaucoma.  HISTORY OF PRESENT ILLNESS:  This 77 year old white female resides alone.  She presented with acute vertigo very early in the morning of admission and was also nauseated.  She has had some mild headaches recently.  Initially, she denied any insighting events such as respiratory illnesses, head injury, but on the morning of discharge she finally said that she thinks that driving around the mountains and all the curves may have triggered all of that and this, in fact, occurred the day prior to this presentation.  Because the patient lived alone and because of the daughters concern that she might fall and injure herself, it was felt that we had little choice except to admit her for observation and subsequent evaluation.  HOSPITAL COURSE:  The patient was admitted to the regular floor and monitored expectantly.  She did not require telemetry monitoring.  MRI of the brain, including cerebellar and brain stem, was ordered.  The was accomplished the afternoon of admission, but the report was not available at the time of this dictation the following morning.  She had a very uncomplicated hospital course and the morning of discharge, she was feeling perfectly fine without any further dizziness or nausea.  It was felt that she could be safely discharged home with followup in the office in a few weeks.  LABORATORY DATA:  Her urinalysis was unremarkable.  Her basic metabolic panel was normal with potassium of 3.8, sodium of 140.   Her CBC was normal with a white count of 5300, hemoglobin 12.7 grams, hematocrit 37%.  A CT scan of the head without contrast was unremarkable.  CONSULTATIONS:  None.  OPERATIONS:  None.  TRANSFUSIONS:  None.  RECOMMENDATIONS ON DISCHARGE:  The patient was advised to use Meclizine for any mild vertiginous symptoms.  She was to avoid any excess salt.  Remain on her same medications as prescribed prior to hospitalization, including:  1. Norvasc 10 mg daily. 2. Glaucoma eye drops, as prescribed by Dr. Sol Blazing.  She will see me in the office in three to four weeks; call with any further symptoms.  She will get a flu shot on return to the office.  CONDITION ON DISCHARGE:  Improved.  PROGNOSIS:  Good.   Dictated by:   Janae Bridgeman Eloise Harman., M.D. Attending Physician:  Vashti Hey DD:  05/06/01 TD:  05/06/01 Job: [id number]

## 2010-11-24 NOTE — Discharge Summary (Signed)
NAME:  Yvette Carroll, Yvette Carroll                            ACCOUNT NO.:  [id number]   MEDICAL RECORD NO.:  [id number]                   PATIENT TYPE:  INP   LOCATION:  0349                                 FACILITY:  [facility]   PHYSICIAN:  Luis A. Delaney Meigs, M.D.                DATE OF BIRTH:  05/14/1920   DATE OF ADMISSION:  09/04/2003  DATE OF DISCHARGE:  09/05/2003                                 DISCHARGE SUMMARY   ADMISSION DIAGNOSES:  1. Nausea, vomiting, mild diarrhea.  2. Volume depletion.  3. Hypertension.  4. Narrow angle glaucoma.   DISCHARGE DIAGNOSES:  1. Nausea, vomiting, diarrhea, resolved.  2. Volume depletion, resolved.  3. Hypertension.  4. Narrow angle glaucoma.   HISTORY OF PRESENT ILLNESS:  The patient is a delightful 77 year old white  female with the above-mentioned past medical history, who presented to the  emergency room on the morning of September 04, 2003, with complaints of  abdominal cramping, vomiting and a brief episode of diarrhea.  The patient  developed these symptoms some time after having been placed on levofloxacin  for a presumed upper respiratory infection by an outside physician.   HOSPITAL COURSE:  She was found to be on admission moderately volume  depleted with some demonstrable orthostatic changes.  Upon admission there  was some concern about the possibility of Clostridium difficile, as her  initial white count was 16 with neutrophilia of 92% and greater than 20%  bands.  A CT of the abdomen demonstrated diverticulosis without  diverticulitis.  There was no growth in the blood cultures, and the CBC  normalized completely with a white count of 6.2, and no bandemia on September 05, 2003.  The patient's symptoms of nausea and vomiting and diarrhea  abated.  She was tolerating p.o. well, and requesting to be discharged.  Upon admission also, a random blood sugar of 152 was noted, and because of a  family history of diabetes, glucose intolerance was  suspected; however, the  hemoglobin A1c was within normal limits at 5.7, and finger stick blood  glucoses were within normal limits during the patient's hospitalization.  A  fasting lipid panel was also within normal limits with a total cholesterol  of 154, triglycerides of 59, and HDL of 49.  The patient's antihypertensive  was held while she was being volume repleted, and the blood pressure  remained within normal limits.  Also an eye drop regimen for her narrow  angle glaucoma was continued, as per prescribed by an ophthalmologist.   DISPOSITION:  She was deemed to be in good condition to be discharged on the  morning of September 05, 2003.   CONDITION ON DISCHARGE:  Good, tolerating p.o. with stable vital signs.   DISCHARGE MEDICATIONS:  1. Eye drops as her home regimen (Lumigan, prednisolone and Timolol as per     her prescribing ophthalmologist).  2. Ultracet p.r.n. back  pain as per her home regimen.  3. Norvasc 10 mg p.o. daily.  4. Reglan 5 mg p.o. q.8h. p.r.n. nausea, as the patient has a history of a     borderline abnormal gastric emptying study.   FOLLOWUP:  The patient is encouraged to follow up in a timely fashion with  Dr. Marcy Salvo C. Lendell Caprice, her primary care physician, especially if symptoms  of diarrhea recur, as the patient was not able to provide a stool sample  during the hospitalization to definitively rule out C. difficile, although  it is less likely that this was the case, as the diarrhea abated without any  specific treatment, and the leukocytosis resolved as well.   PROBLEM:  1. Nausea, vomiting, diarrhea, resolved, likely secondary to adverse effect     from levofloxacin.  2. Mildly delayed gastric emptying.  3. Narrow angle glaucoma.  4. Hypertension.  5. A family history of breast cancer.                                               Luis A. Delaney Meigs, M.D.    LAT/MEDQ  D:  09/05/2003  T:  09/05/2003  Job:  [id number]

## 2010-11-24 NOTE — H&P (Signed)
[facility]  Patient:    Yvette Carroll, Yvette Carroll Visit Number: [id number] MRN: [id number]          Service Type: MED Location: 3W [phone] Attending Physician:  Vashti Hey Dictated by:   Janae Bridgeman Eloise Harman., M.D. Admit Date:  05/05/2001 Discharge Date: 05/06/2001                           History and Physical  DATE OF BIRTH:  1920/04/18.  CHIEF COMPLAINT:  "Im real dizzy."  HISTORY OF PRESENT ILLNESS:  This patient presents to the [facility] Emergency Room on May 05, 2001, complaining of vertigo when she got up to go to the bathroom on the morning of admission.  Symptoms were rather violent, and she was staggery and nauseated.  She presents for further assessment and evaluation.  She lives alone.  Her daughter works at the [employer] in the labor and delivery department and insists that the patient cannot go home in this state.  The patient has had no previous major events with vertigo, although she has had some minor events in the past.  The patient does have a history of hypertension and is on medication.  FAMILY HISTORY:  Father dead at 12 of old age.  Mother died at 20 of a stroke, and she did have high blood pressure.  The patient had three brothers and nine sisters.  One brother deceased of cancer of the pancreas, another of emphysema and heart disease.  The patients sisters had a variety of diseases, including Huntingtons chorea, ALS, uterine cancer, breast cancer, and two of them had goiters.  No one with any central nervous system disorders other than the degenerative processes as described.  A maternal aunt had Huntingtons chorea. Another maternal aunt was possibly diabetic.  She has a son and a daughter in good health.  ALLERGIES:  She is allergic to a host of products and has intolerances to as many.  These include PENICILLIN, swelling and a rash; ASPIRIN, palpitations; MYCINS, especially ACHROMYCIN,  diarrhea; CODEINE, increases her heart rate; FLEXERIL, makes her shaky; BENADRYL, makes her nervous; CECLOR, diarrhea; VOLTAREN, burning stomach; RELAFEN, GI upset and burning stomach; DARVOCET, nausea; DAYPRO, GI upset; VIOXX, reflux; CELEBREX, reflux; ARTHROTEC, GI symptoms; HYDROCODONE, nausea.  PAST MEDICAL/SURGICAL HISTORY:  She has been hospitalized with vertigo back in 1986.  She has had cataract surgery in 1979 and has been on glaucoma drops ever since.  REVIEW OF SYSTEMS:  HEENT:  Has had current episode of vertigo and then one previous one in 1986.  She has had no significant headache.  She does have glaucoma.  HEENT otherwise unremarkable.   CARDIORESPIRATORY:  She denies any chest pain or shortness of breath of any consequence.  No history of cardiac disorder.  She does have hypertension and is on Norvasc 10 mg daily.  No history of TB, no asthma.  GASTROINTESTINAL:  She does have all kinds of gastric upsets and intermittent colitis symptoms.  No hematemesis or melena. No history of gallbladder disease.  She has had episodes of diarrhea and cramping in the past and possibly an irritable bowel-type syndrome. GENITOURINARY:  Denies any recent problems with urinary frequency, urgency, or dysuria.  No history of kidney stones.  MUSCULOSKELETAL:  She has had some bursitis symptoms in her shoulders but no loss of function.  HEMATOLOGIC:  No bleeding or bruising tendencies.  ENDOCRINOLOGIC:  No diabetes or thyroid  disorders.  NEUROPSYCHIATRIC:  She is a somewhat anxious person and has not been on any antidepressant or mood-altering agents.  PHYSICAL EXAMINATION:  VITAL SIGNS:  On presentation in the emergency room, blood pressure recorded at 143/70, pulse of 75, respirations 24, temperature 97.2 degrees.  GENERAL:  This is a well-developed, well-nourished white female who appears in no acute distress.  SKIN:  Somewhat pale.  HEENT:  Pupils equal, round and reactive to light  and accommodation, _____ consensually.  Extraocular motions are intact.  No signs of scleral icterus. The oral cavity is unremarkable.  Tongue protrudes in the midline.  Membranes are pink and moist.  NECK:  Supple.  No thyromegaly or adenopathy.  There are no carotid bruits.  CHEST:  Lungs are clear.  CARDIAC:  Regular rhythm without murmurs, gallops, or rubs.  ABDOMEN:  Soft and nontender, no masses.  PELVIC, RECTAL:  Deferred.  EXTREMITIES:  No edema, cyanosis, or clubbing.  NEUROLOGIC:  No focal findings.  She is not fit for gait testing at this time. There do not appear to be any neurologic processes occurring.  LABORATORY DATA:  CT scan of the head ordered by the EDP is, of course, unremarkable.  IMPRESSION: 1. Acute vertigo. 2. History of glaucoma.  PLAN:  Due to factors beyond my control, the patient will be admitted for further assessment and evaluation.  She will be given some antiemetics and Antivert for symptom control.  We will do an MRI of her brainstem and cerebellar area to exclude any cerebrovascular insult in these areas.  If her symptoms subside to any degree in the next 24 hours, we will discharge her back home to the care of her family. Dictated by:   Janae Bridgeman Eloise Harman., M.D. Attending Physician:  Vashti Hey DD:  05/19/01 TD:  05/19/01 Job: [id number]

## 2011-04-20 ENCOUNTER — Emergency Department (HOSPITAL_COMMUNITY)
Admission: EM | Admit: 2011-04-20 | Discharge: 2011-04-20 | Disposition: A | Discharge status: Home / Self Care | Payer: Medicare Other | Source: Home / Self Care | Attending: Emergency Medicine | Admitting: Emergency Medicine

## 2011-04-20 ENCOUNTER — Emergency Department (HOSPITAL_COMMUNITY): Payer: Medicare Other

## 2011-04-20 DIAGNOSIS — W010XXA Fall on same level from slipping, tripping and stumbling without subsequent striking against object, initial encounter: Secondary | ICD-10-CM | POA: Insufficient documentation

## 2011-04-20 DIAGNOSIS — Y92009 Unspecified place in unspecified non-institutional (private) residence as the place of occurrence of the external cause: Secondary | ICD-10-CM | POA: Insufficient documentation

## 2011-04-20 DIAGNOSIS — M25559 Pain in unspecified hip: Secondary | ICD-10-CM | POA: Insufficient documentation

## 2011-04-20 DIAGNOSIS — I10 Essential (primary) hypertension: Secondary | ICD-10-CM | POA: Insufficient documentation

## 2011-04-20 DIAGNOSIS — H409 Unspecified glaucoma: Secondary | ICD-10-CM | POA: Insufficient documentation

## 2011-04-22 ENCOUNTER — Emergency Department (HOSPITAL_COMMUNITY): Payer: Medicare Other

## 2011-04-22 ENCOUNTER — Inpatient Hospital Stay (HOSPITAL_COMMUNITY)
Admission: EM | Admit: 2011-04-22 | Discharge: 2011-04-30 | DRG: 481 | Disposition: A | Discharge status: Skilled Nursing Facility | Payer: Medicare Other | Attending: Family Medicine | Admitting: Family Medicine

## 2011-04-22 DIAGNOSIS — I2510 Atherosclerotic heart disease of native coronary artery without angina pectoris: Secondary | ICD-10-CM | POA: Diagnosis present

## 2011-04-22 DIAGNOSIS — I10 Essential (primary) hypertension: Secondary | ICD-10-CM | POA: Diagnosis present

## 2011-04-22 DIAGNOSIS — K5900 Constipation, unspecified: Secondary | ICD-10-CM | POA: Diagnosis not present

## 2011-04-22 DIAGNOSIS — Z66 Do not resuscitate: Secondary | ICD-10-CM | POA: Diagnosis present

## 2011-04-22 DIAGNOSIS — M479 Spondylosis, unspecified: Secondary | ICD-10-CM | POA: Diagnosis present

## 2011-04-22 DIAGNOSIS — Z79899 Other long term (current) drug therapy: Secondary | ICD-10-CM

## 2011-04-22 DIAGNOSIS — S72143A Displaced intertrochanteric fracture of unspecified femur, initial encounter for closed fracture: Principal | ICD-10-CM | POA: Diagnosis present

## 2011-04-22 DIAGNOSIS — Z7982 Long term (current) use of aspirin: Secondary | ICD-10-CM

## 2011-04-22 DIAGNOSIS — G8929 Other chronic pain: Secondary | ICD-10-CM | POA: Diagnosis present

## 2011-04-22 DIAGNOSIS — D62 Acute posthemorrhagic anemia: Secondary | ICD-10-CM | POA: Diagnosis not present

## 2011-04-22 DIAGNOSIS — M4800 Spinal stenosis, site unspecified: Secondary | ICD-10-CM | POA: Diagnosis present

## 2011-04-22 DIAGNOSIS — E871 Hypo-osmolality and hyponatremia: Secondary | ICD-10-CM | POA: Diagnosis not present

## 2011-04-22 DIAGNOSIS — H409 Unspecified glaucoma: Secondary | ICD-10-CM | POA: Diagnosis present

## 2011-04-22 DIAGNOSIS — Y92009 Unspecified place in unspecified non-institutional (private) residence as the place of occurrence of the external cause: Secondary | ICD-10-CM

## 2011-04-22 DIAGNOSIS — Z880 Allergy status to penicillin: Secondary | ICD-10-CM

## 2011-04-22 DIAGNOSIS — Z7902 Long term (current) use of antithrombotics/antiplatelets: Secondary | ICD-10-CM

## 2011-04-22 DIAGNOSIS — R5381 Other malaise: Secondary | ICD-10-CM | POA: Diagnosis present

## 2011-04-22 DIAGNOSIS — W19XXXA Unspecified fall, initial encounter: Secondary | ICD-10-CM | POA: Diagnosis present

## 2011-04-22 DIAGNOSIS — Y998 Other external cause status: Secondary | ICD-10-CM

## 2011-04-23 ENCOUNTER — Inpatient Hospital Stay (HOSPITAL_COMMUNITY): Payer: Medicare Other

## 2011-04-23 LAB — CBC
HCT: 32.5 % — ABNORMAL LOW (ref 36.0–46.0)
Hemoglobin: 11 g/dL — ABNORMAL LOW (ref 12.0–15.0)
MCH: 31.2 pg (ref 26.0–34.0)
MCHC: 33.8 g/dL (ref 30.0–36.0)
RDW: 13.2 % (ref 11.5–15.5)

## 2011-04-23 LAB — BASIC METABOLIC PANEL
BUN: 10 mg/dL (ref 6–23)
Creatinine, Ser: 0.56 mg/dL (ref 0.50–1.10)
GFR calc non Af Amer: 81 mL/min — ABNORMAL LOW (ref >90)
Glucose, Bld: 107 mg/dL — ABNORMAL HIGH (ref 70–99)
Potassium: 3.8 mEq/L (ref 3.5–5.1)

## 2011-04-23 NOTE — H&P (Signed)
NAMEMarland Carroll  MONSERRATT, Yvette Carroll NO.:  [id number]  MEDICAL RECORD NO.:  [id number]  LOCATION:  1605                         FACILITY:  [facility]  PHYSICIAN:  Gery Pray, MD      DATE OF BIRTH:  March 11, 1920  DATE OF ADMISSION:  04/22/2011 DATE OF DISCHARGE:                             HISTORY & PHYSICAL   PRIMARY CARE PHYSICIAN:  Massie Maroon, MD  CODE STATUS:  DNR.  The patient goes to team 3.  CHIEF COMPLAINT:  Pain.  HISTORY OF PRESENT ILLNESS:  This is a rather pleasant, 77 year old female who states that on Friday, she fell.  She was seen at [facility], x- rays were done which showed she had no acute fracture.  She was discharged home.  She states she was at home on Friday and Saturday. She felt as her hip was giving away.  She states that she stayed in bed most of the time.  She got up yesterday and went to the Carroll, she could hardly get around secondary to pain and weakness and exertion in the left lower extremity and hip.  Today, she felt she had the same situation; however, she felt as though her pain and her instability were getting worse.  She walks with a rolling walker.  She lives alone.  In addition to thei new pain,  she usually has chronic back pain from stenosis and arthritis in the back. today she felt as though she was going to fall several times, therefore she came back to the ER.  She states she does not feel as though she is safe at home. She also wonders if she could have broken something.  She was recently admitted in January with a fracture. She states that she fell from early December and the fracture was not in evidence and did not reveal itself until January.  History obtained from the patient who appears reliable.  REVIEW OF SYSTEMS:  Ten point systems reviewed and negative except as noted in HPI.  PAST MEDICAL HISTORY: 1. Glaucoma. 2. Hypertension. 3. Coronary artery disease. 4. Chronic low back pain from stenosis.  PAST SURGICAL  HISTORY:  Includes oophorectomy, hysterectomy, bladder lift, and ORIF left leg.  MEDICATIONS: 1. Iron sulfate 325 mg twice daily. 2. Protonix daily. 3. Norvasc 10 mg daily. 4. Aspirin daily. 5. Lumigan eyedrops, both eyes, before sleep. 6. Toprol-XL 25 mg daily. 7. Multivitamin tablets daily. 8. Plavix daily. 9. Zocor 20 mg p.o. sleep. 10.Timolol eyedrops daily.  ALLERGIES:  Patient is allergic to, 1. IODINE, specifically BETADINE. 2. PENICILLIN. 3. CODEINE. 4. ERYTHROMYCIN.  SOCIAL HISTORY:  Negative tobacco, alcohol, or illicit drugs.  Patient lives alone.  She does not have home oxygen.  She does have help 3 times a week.  She uses a rolling walker.  FAMILY HISTORY:  Significant for coronary artery disease and hypertension.  PHYSICAL EXAMINATION:  VITAL SIGNS:  Blood pressure in the ER was 115/77, pulse 61, respirations 16, temperature 97.8, saturating 97% on room air. GENERAL:  Alert and oriented female.  Currently , does she does appear comfortable. EYES:  Pink conjunctivae.  PERRLA. ENT:  Moist oral mucosa.  Trachea midline.NECK:  Supple.  No thyromegaly. LUNGS:  Clear to auscultation bilaterally.  No wheeze.  No use of accessory muscles. CARDIOVASCULAR:  Regular rate and rhythm without murmurs, rigors, or gallops.  No JVD.  No carotid bruits. ABDOMEN:  Soft, positive bowel sounds, nontender, and nondistended.  No organomegaly. NEUROLOGIC:  Cranial nerves II through XII grossly intact.  Sensation intact. MUSCULOSKELETAL:  Strength is 5/5 in all extremities.  No edema appreciated. SKIN:  Patient has bruising on the left calf as well as bruising on the right thigh.  Patient does have some tenderness to palpation within the left groin region.  LABORATORY DATA:  CT of the left hip shows no evidence of left femoral neck fracture.  It did show evidence of a healing right inferior and superior pubic rami fracture.  No other labs.  ASSESSMENT AND PLAN: 1.  Intractable pain leading to the instability and debility.     The patient will be brought in for pain control.  Physical therapy     will also be consulted. 2. Hypertension. 3. Coronary artery disease. 4. Chronic low back pain.  Resume home medications.          ______________________________ Gery Pray, MD     DC/MEDQ  D:  04/23/2011  T:  04/23/2011  Job:  [id number]  Electronically Signed by Gery Pray MD on 04/23/2011 05:27:36 AM

## 2011-04-24 ENCOUNTER — Observation Stay (HOSPITAL_COMMUNITY): Payer: Medicare Other

## 2011-04-24 ENCOUNTER — Inpatient Hospital Stay (HOSPITAL_COMMUNITY): Payer: Medicare Other

## 2011-04-24 LAB — URINALYSIS, ROUTINE W REFLEX MICROSCOPIC
Glucose, UA: NEGATIVE
Hgb urine dipstick: NEGATIVE
Ketones, ur: NEGATIVE
Protein, ur: NEGATIVE

## 2011-04-24 LAB — DIFFERENTIAL
Basophils Relative: 0
Eosinophils Absolute: 0
Eosinophils Relative: 0
Lymphocytes Relative: 7 — ABNORMAL LOW
Neutrophils Relative %: 88 — ABNORMAL HIGH

## 2011-04-24 LAB — TYPE AND SCREEN: Antibody Screen: NEGATIVE

## 2011-04-24 LAB — COMPREHENSIVE METABOLIC PANEL
Albumin: 3.7
BUN: 11
Calcium: 9.1
Creatinine, Ser: 0.78
Total Protein: 6.8

## 2011-04-24 LAB — LIPASE, BLOOD: Lipase: 16

## 2011-04-24 LAB — MRSA PCR SCREENING: MRSA by PCR: NEGATIVE

## 2011-04-24 LAB — CBC
HCT: 38.1
MCV: 89.7
Platelets: ADEQUATE
RDW: 13.8

## 2011-04-25 LAB — BASIC METABOLIC PANEL
BUN: 15 mg/dL (ref 6–23)
Calcium: 8.3 mg/dL — ABNORMAL LOW (ref 8.4–10.5)
Creatinine, Ser: 0.66 mg/dL (ref 0.50–1.10)
GFR calc non Af Amer: 77 mL/min — ABNORMAL LOW (ref >90)
Glucose, Bld: 148 mg/dL — ABNORMAL HIGH (ref 70–99)
Sodium: 132 mEq/L — ABNORMAL LOW (ref 135–145)

## 2011-04-25 LAB — CBC
Hemoglobin: 9.7 g/dL — ABNORMAL LOW (ref 12.0–15.0)
MCH: 31.3 pg (ref 26.0–34.0)
MCHC: 34.2 g/dL (ref 30.0–36.0)

## 2011-04-26 LAB — CBC
MCHC: 33.3 g/dL (ref 30.0–36.0)
MCV: 91.6 fL (ref 78.0–100.0)
Platelets: 208 10*3/uL (ref 150–400)
Platelets: 195 10*3/uL (ref 150–400)
RDW: 13.2 % (ref 11.5–15.5)
RDW: 13.4 % (ref 11.5–15.5)
WBC: 5.7 10*3/uL (ref 4.0–10.5)

## 2011-04-26 LAB — BASIC METABOLIC PANEL
BUN: 17 mg/dL (ref 6–23)
Calcium: 8.3 mg/dL — ABNORMAL LOW (ref 8.4–10.5)
Creatinine, Ser: 0.73 mg/dL (ref 0.50–1.10)
GFR calc Af Amer: 86 mL/min — ABNORMAL LOW (ref >90)
GFR calc non Af Amer: 74 mL/min — ABNORMAL LOW (ref >90)
Potassium: 3.7 mEq/L (ref 3.5–5.1)

## 2011-04-27 LAB — BASIC METABOLIC PANEL
GFR calc Af Amer: 88 mL/min — ABNORMAL LOW (ref >90)
GFR calc non Af Amer: 76 mL/min — ABNORMAL LOW (ref >90)
Glucose, Bld: 108 mg/dL — ABNORMAL HIGH (ref 70–99)
Potassium: 3.7 mEq/L (ref 3.5–5.1)
Sodium: 135 mEq/L (ref 135–145)

## 2011-04-27 LAB — CBC
Hemoglobin: 7.4 g/dL — ABNORMAL LOW (ref 12.0–15.0)
MCHC: 33.8 g/dL (ref 30.0–36.0)
RDW: 13.6 % (ref 11.5–15.5)
WBC: 6 10*3/uL (ref 4.0–10.5)

## 2011-04-28 LAB — CBC
Hemoglobin: 7.5 g/dL — ABNORMAL LOW (ref 12.0–15.0)
RBC: 2.42 MIL/uL — ABNORMAL LOW (ref 3.87–5.11)

## 2011-04-30 NOTE — Op Note (Signed)
  NAMENORRIS, BODLEY NO.:  [id number]  MEDICAL RECORD NO.:  [id number]  LOCATION:  1605                         FACILITY:  [facility]  PHYSICIAN:  Madlyn Frankel. Charlann Boxer, M.D.  DATE OF BIRTH:  [birth year]  DATE OF PROCEDURE: DATE OF DISCHARGE:                              OPERATIVE REPORT   ADDENDUM:  Physician assistant, Lanney Gins, was present for the entirety of the case and involved preoperative positioning, perioperative retractor management, and general facilitation of the case.  He was involved with primary wound closure.     Madlyn Frankel Charlann Boxer, M.D.     MDO/MEDQ  D:  04/24/2011  T:  04/25/2011  Job:  [id number]  Electronically Signed by Durene Romans M.D. on 04/30/2011 12:40:29 PM

## 2011-04-30 NOTE — Op Note (Signed)
NAME:  Yvette Carroll, Yvette Carroll NO.:  [id number]  MEDICAL RECORD NO.:  [id number]  LOCATION:                               FACILITY:  [facility]  PHYSICIAN:  Madlyn Frankel. Charlann Boxer, M.D.  DATE OF BIRTH:  01-24-1920  DATE OF PROCEDURE:  04/24/2011 DATE OF DISCHARGE:                              OPERATIVE REPORT   PREOPERATIVE DIAGNOSIS:  Nondisplaced left intertrochanteric femur fracture.  POSTOPERATIVE DIAGNOSES:  Nondisplaced left intertrochanteric femur fracture.  Open reduction and internal fixation of the left intertrochanteric femur fracture utilizing the Biomet/DePuy TK2 lag screw with 130-degree/three-hole side plate.  SURGEON:  Madlyn Frankel. Charlann Boxer, M.D.  ASSISTANT:  Lanney Gins, PA  ANESTHESIA:  General.  SPECIMENS:  None.  COMPLICATION:  None.  DRAINS:  None.  BLOOD LOSS:  About 150 cc.  INDICATION FOR PROCEDURE:  Ms. Overall is an 77 year old female, who presented to the hospital after initially being seen in the ER on Friday.  She had a fall, had progressive pain, radiographs were inconclusive, so an MRI was ordered.  The MRI revealed edematous changes with an obvious crack within the inner trochanteric segment.  There was no significant displacement.  After revealing the reasoning behind proceeding with hip replacement her hip fixation, risks, and benefits were discussed with the family via the phone as well.  The patient consent was obtained for the above.  PROCEDURE IN DETAIL:  The patient was brought to operative theater. Once adequate anesthesia, preop antibiotics, Ancef administered, the patient was positioned supine on the fracture table.  Bony prominence was padded.  Fluoroscopy was then brought in the field to evaluate the fracture, remained stable.  At this point, the left hip was prepped and draped in sterile fashion with the shower curtain technique.  Time-out was performed identifying the patient, planned procedure, and extremity.  Landmarks  identified fluoroscopically and then incision made laterally along the side of the femur.  Sharp dissection was carried to iliotibial band, which was then incised.  The vastus lateralis was then elevated anteriorly with cauterization of the circumflex vessels.  Using the 130-degree guide, the guide pin was inserted into the lateral cortex and then positioned in the center of the head in AP and lateral planes.  With the guidewires appropriate depth, I  measured and chosen a 90 mm lag screw.  We then drilled the proximal femur, tapped and placed the 90 mm lag screw.  A 3-hole 130-degree plate was placed over top of this and taped down the lateral cortex, and confirmed both by direct visualization as well as fluoroscopy.  Three bicortical screws were then placed.  I then did place the screw into the lag screw, applied some compression to further compress and reduced the fracture.  At this point, the final radiographs were obtained in AP and lateral planes.  The wound was irrigated.  I reapproximated the iliotibial band, lying the vastus lateralis over top the lateral plate.  At this point, following closure of the iliotibial band using 1 Vicryl, the remainder of wound was closed with 2-0 Vicryl, running 4-0 Monocryl. The hip was cleaned, dried, and dressed sterilely using a Dermabond and Aquacel dressing drain.  The patient was then extubated and brought to the recovery room in stable condition, tolerating the procedure well.     Madlyn Frankel Charlann Boxer, M.D.     MDO/MEDQ  D:  04/24/2011  T:  04/25/2011  Job:  [id number]  Electronically Signed by Durene Romans M.D. on 04/30/2011 12:40:33 PM

## 2011-04-30 NOTE — Consult Note (Signed)
NAMEJACY, BROCKER NO.:  [id number]  MEDICAL RECORD NO.:  [id number]  LOCATION:  1605                         FACILITY:  [facility]  PHYSICIAN:  Madlyn Frankel. Charlann Boxer, M.D.  DATE OF BIRTH:  13-Aug-1919  DATE OF CONSULTATION:  04/22/2010 DATE OF DISCHARGE:                                CONSULTATION   REASON FOR CONSULTATION:  Left hip pain.  DIAGNOSIS:  Left intertrochanteric femur fracture. nondisplaced.  ADMITTING HISTORY:  Ms. Hanratty is an 77 year old female, who currently lives alone.  She had a fall on Friday and was seen at [facility] with and read plain radiographs were not diagnostic for hip fracture. She was sent home.  She had increasing pain in her hip and subsequently came to [facility] on Monday, the 15th.  She was admitted by the medical service on Sunday night for intractable pain and MRI was ordered to rule out an occult fracture of left hip based on her persistent pain and inability to bear weight.  Ms. Hilaire reports to [place] the same history rather sudden onset after this fall on Friday with inability to bear weight, difficult time with active movement of her left hip joint.  No fevers, chills, night sweats.  She had problems in the right hip in the past with documentation of inferior and superior rami fractures that had been healed on studies, but this was also reported from her history.  PAST MEDICAL HISTORY:  Includes: 1. Glaucoma. 2. Hypertension. 3. Coronary disease. 4. Chronic low back pain. 5. Spinal stenosis.  PAST SURGICAL HISTORY:  Includes: 1. Oophorectomy. 2. Hysterectomy. 3. Bladder tacking. 4. ORIF of her left leg in the past.  MEDICATIONS:  Iron, Protonix, Norvasc, aspirin, Lumigan, Toprol, multivitamins, Plavix, Zocor, and timolol.  DRUG ALLERGIES: 1. IODINE, specifically BETADINE. 2. PENICILLIN. 3. CODEINE. 4. ERYTHROMYCIN.  SOCIAL HISTORY:  She lives alone.  Denies any tobacco or alcohol use. She does not use home  O2.  She has help 3 times a week.  She has rolling walker.  PHYSICAL EXAMINATION: Ms. Cajas is seen and evaluated in her hospital bed, 1605 at [facility]. VITAL SIGNS:  She is afebrile and stable vital signs per the computer. GENERAL:  She is in no acute distress.  She is sitting at bedside. EXTREMITIES:  Active hip flexion is possible, but does produce pain in the groin.  Any hip passive internal or external rotation reproduces groin pain as well.  Her right hip range of motion is normal without pain.  She has no evidence any significant venous lymphatic changes distally with intact sensibility in lower extremities.  Her general medical exam at the time of admission was reviewed through the office notes, but not repeated for this consultation.  RADIOGRAPHS:  Review of her plain ordered on October 12 for the left hip were indeed nondiagnostic for a hip fracture even retrospectively.  This was later even supported by x-rays done on October 14 again which did not reveal obvious displaced femoral neck or intertrochanteric femur fracture.  Subsequently, an MRI was ordered on the 15th.  This reveals a nondisplaced fracture involving the intertrochanteric segment with active edematous change in visible  fracture.  ASSESSMENT:  Nondisplaced left intertrochanteric femur fracture.  PLAN:  After reviewing with Ms. Smestad, her current situation, I had a chance to speak to her granddaughter on her behalf.  I reviewed the fracture with her.  After reviewing this with her, it is my recommendation that she consider open reduction and internal fixation for pain control as well as for fracture healing as well as to prevent displacement as nonoperative management would require bedrest activity for 6 to 8 weeks.  Her granddaughter at this point, will review this with her parents and they are currently on vacation in [place], and will not be available until Wednesday night.  We will go ahead  and make her n.p.o. tonight and will plan surgical procedure for Tuesday.  I have made myself available for questions with family members and on an as-needed basis.  Consent will be obtained for an open reduction and internal fixation.  N.p.o. after midnight.     Madlyn Frankel Charlann Boxer, M.D.     MDO/MEDQ  D:  04/23/2011  T:  04/24/2011  Job:  [id number]  Electronically Signed by Durene Romans M.D. on 04/30/2011 12:40:23 PM

## 2011-05-04 NOTE — Discharge Summary (Signed)
NAME:  Yvette Carroll, Yvette Carroll NO.:  [id number]  MEDICAL RECORD NO.:  [id number]  LOCATION:  1605                         FACILITY:  [facility]  PHYSICIAN:  Brendia Sacks, MD    DATE OF BIRTH:  [birth year]  DATE OF ADMISSION:  04/22/2011 DATE OF DISCHARGE:  04/30/2011                        DISCHARGE SUMMARY - REFERRING   PRIMARY CARE PHYSICIAN:  Massie Maroon, MD.  PRIMARY ORTHOPEDIC SURGEON:  Madlyn Frankel. Charlann Boxer, MD  CONDITION ON DISCHARGE:  Improved.  DISPOSITION:  Transferred to skilled nursing facility for acute rehab.  DISCHARGE DIAGNOSES: 1. Left hip fracture, status post surgical repair. 2. Stable postop anemia. 3. Resolved hyponatremia. 4. Constipation, resolved.  HISTORY OF PRESENT ILLNESS:  This is an 77 year old woman who presented with hip pain.  She was found to have a hip fracture.  HOSPITAL COURSE: 1. Yvette Carroll was admitted to the medical floor.  She was seen in     consultation with Orthopedic Surgery.  She underwent surgery for     her left hip fracture.  She has been cleared by Orthopedics for     discharge at this time.  Weightbearing as tolerated.  She will     continue at skilled nursing facility for acute rehab needs. 2. Postoperative anemia.  This is stabilized without the need for     blood products.  She will continue iron in the outpatient setting. 3. Constipation.  This has resolved with inpatient treatment.  CONSULTATIONS:  Orthopedics as above.  Recommendations as above.  PROCEDURES:  Open reduction and internal fixation of the left intertrochanteric femur fracture, October 16th by Dr. Charlann Boxer.  IMAGING: 1. Left hip film, October 14th:  No acute bony abnormality.  Healing     right superior and inferior pubic rami fractures. 2. CT of the hip, October 14th:  No evidence of left femoral neck     fracture. 3. MRI of the hip, October 15th:  Acute incomplete intertrochanteric     fracture of the proximal left femur with secondary greater  trochanteric bursitis.  Possible subtle contusion to the left side     of the sacrum. 4. Chest x-ray, October 16th:  No acute disease. 5. Left hip film, October 16th:  Open reduction and internal fixation     of the left intertrochanteric hip fracture.  MICROBIOLOGY:  None.  LABORATORY STUDIES: 1. CBC notable for a hemoglobin of 11.0, which appears to be near the     patient's baseline.  On discharge, hemoglobin 7.5 and stable. 2. Basic metabolic panel, unremarkable. 3. Anemia panel notable for low iron. 4. Urinalysis was negative.  DISCHARGE INSTRUCTIONS:  The patient will be transferred to a skilled facility today for inpatient rehab.  ACTIVITY:  Weightbearing as tolerated, increase slowly, walk with assistance.  DIET:  Low-sodium heart healthy diet.  Follow up Dr. Pearson Grippe 2 weeks after release from rehab and follow up with Dr. Charlann Boxer within in 2 weeks.  DISCHARGE MEDICATIONS NEW: 1. Colace 100 mg p.o. b.i.d. 2. Hydrocodone/acetaminophen 10/325 mg 1 tablet every 4 hours by mouth     while awake, hold for sedation while sleeping, #20.  No refills. 3. Robaxin 500 mg p.o.  q.6 h. as needed for muscle spasm, #10, no     refills. 4. Nystatin 5 mL by mouth 4 times a day for thrush.  Continue 48     hours after thrush resolution. 5. Polyethylene glycol 17 g p.o. t.i.d.  Resume the following medications: 1. Amlodipine 10 mg p.o. daily. 2. Aspirin 81 mg p.o. daily. 3. Citalopram 20 mg p.o. daily. 4. Claritin 10 mg p.o. daily. 5. Iron 325 mg p.o. daily. 6. Lotemax 1 drop both eyes b.i.d. 7. Lumigan 0.03% ophthalmic 1 drop both eyes q.h.s. 8. Metoprolol 25 mg p.o. daily. 9. Plavix 75 mg p.o. daily. 10.Protonix 40 mg p.o. daily as needed for heartburn. 11.Menthol 30% and methyl salicylate 10% patch transdermally daily as     needed for back pain. 12.Senna 2 tablets p.o. daily as needed for constipation. 13.Simvastatin 20 mg p.o. q.h.s. 14.Timolol ophthalmic solution 0.5% 1  drop both eyes b.i.d.  Discontinue the following medications: 1. Ultracet.  Time coordinating discharge is 25 minutes.     Brendia Sacks, MD     DG/MEDQ  D:  04/30/2011  T:  04/30/2011  Job:  [id number]  cc:   Massie Maroon, MD Fax: [phone]  Madlyn Frankel. Charlann Boxer, M.D. Fax: [phone]  Electronically Signed by Brendia Sacks  on 05/04/2011 02:53:09 PM

## 2011-12-27 ENCOUNTER — Ambulatory Visit (HOSPITAL_COMMUNITY)
Admission: RE | Admit: 2011-12-27 | Discharge: 2011-12-27 | Disposition: A | Discharge status: Home / Self Care | Payer: Medicare Other | Source: Ambulatory Visit | Attending: Internal Medicine | Admitting: Internal Medicine

## 2011-12-27 DIAGNOSIS — R1312 Dysphagia, oropharyngeal phase: Secondary | ICD-10-CM | POA: Insufficient documentation

## 2011-12-27 DIAGNOSIS — K219 Gastro-esophageal reflux disease without esophagitis: Secondary | ICD-10-CM | POA: Insufficient documentation

## 2011-12-27 DIAGNOSIS — K117 Disturbances of salivary secretion: Secondary | ICD-10-CM | POA: Insufficient documentation

## 2011-12-27 NOTE — Procedures (Signed)
Objective Swallowing Evaluation: Modified Barium Swallowing Study  Patient Details  Name: Yvette Carroll MRN: [id number] Date of Birth: [birth year]  Today's Date: 12/27/2011 Time: 0865-7846 SLP Time Calculation (min): 30 min  Past Medical History: No past medical history on file. Past Surgical History: No past surgical history on file. HPI:  Pt is an 78 year old female arrivig for an outpatient MBS due to complaints of choking with liquids and solids, especially with mixed consistencies. The pt also complians of GERD, dry mouth, globus, expectoration of POs and foamy phlegm. She had a cervical fusion approximately 10 years ago that never affected her swallowing per the pt. She also reports a questionable remote CVA.      Assessment / Plan / Recommendation Clinical Impression  Dysphagia Diagnosis: Suspected primary esophageal dysphagia;Mild pharyngeal phase dysphagia Clinical impression: Ms. Goins presents with a mild pharyngeal dysphagia primarily due to anatomical curly shape of epiglottis resulting in incomplete epiglottic deflection during the swallow with consistent flash penetration of thin liquids. When pt challenged with mixed consistency one instance of flash penetration to the cords occurred. Aspiration risk due to pharyngeal dysphagia is minimal and pt may continue a regular diet with thin liquids. Aspiration risk would increase with any acute debilitating illness with loss of functional reserve. Also of concern are the pts complaints consistent with a significant esophageal dysphagia. Esophageal sweep only revealed slow transit of solids with pill becoming lodged at GE junction. SLP offered esophageal precautions to pt and caregiver.     Treatment Recommendation       Diet Recommendation Regular;Thin liquid   Liquid Administration via: Cup;Straw Medication Administration: Whole meds with liquid Supervision: Patient able to self feed Compensations: Slow rate;Small sips/bites;Follow  solids with liquid Postural Changes and/or Swallow Maneuvers: Seated upright 90 degrees;Upright 30-60 min after meal    Other  Recommendations Recommended Consults: Consider GI evaluation   Follow Up Recommendations  None    Frequency and Duration        Pertinent Vitals/Pain NA    SLP Swallow Goals     General HPI: Pt is an 78 year old female arrivig for an outpatient MBS due to complaints of choking with liquids and solids, especially with mixed consistencies. The pt also complians of GERD, dry mouth, globus, expectoration of POs and foamy phlegm. She had a cervical fusion approximately 10 years ago that never affected her swallowing per the pt. She also reports a questionable remote CVA.  Type of Study: Modified Barium Swallowing Study Reason for Referral: Objectively evaluate swallowing function Diet Prior to this Study: Regular;Thin liquids Respiratory Status: Room air History of Recent Intubation: No Behavior/Cognition: Alert;Cooperative;Pleasant mood Oral Cavity - Dentition: Dentures, bottom;Adequate natural dentition Oral Motor / Sensory Function: Within functional limits Self-Feeding Abilities: Able to feed self Patient Positioning: Upright in chair Baseline Vocal Quality: Clear Volitional Cough: Strong Volitional Swallow: Able to elicit Anatomy: Within functional limits    Reason for Referral Objectively evaluate swallowing function   Oral Phase     Pharyngeal Phase Pharyngeal Phase: Impaired   Cervical Esophageal Phase Cervical Esophageal Phase: [facility]    Huntleigh Doolen, Riley Nearing 12/27/2011, 12:19 PM

## 2012-03-26 ENCOUNTER — Other Ambulatory Visit: Payer: Self-pay | Admitting: Orthopaedic Surgery

## 2012-03-26 DIAGNOSIS — M25559 Pain in unspecified hip: Secondary | ICD-10-CM

## 2012-04-08 ENCOUNTER — Ambulatory Visit
Admission: RE | Admit: 2012-04-08 | Discharge: 2012-04-08 | Disposition: A | Discharge status: Home / Self Care | Payer: Medicare Other | Source: Ambulatory Visit | Attending: Orthopaedic Surgery | Admitting: Orthopaedic Surgery

## 2012-04-08 DIAGNOSIS — M25559 Pain in unspecified hip: Secondary | ICD-10-CM

## 2012-05-13 ENCOUNTER — Other Ambulatory Visit (HOSPITAL_COMMUNITY): Payer: Self-pay | Admitting: Orthopaedic Surgery

## 2012-05-13 DIAGNOSIS — IMO0002 Reserved for concepts with insufficient information to code with codable children: Secondary | ICD-10-CM

## 2012-05-20 ENCOUNTER — Ambulatory Visit (HOSPITAL_COMMUNITY)
Admission: RE | Admit: 2012-05-20 | Discharge: 2012-05-20 | Disposition: A | Discharge status: Home / Self Care | Payer: Medicare Other | Source: Ambulatory Visit | Attending: Orthopaedic Surgery | Admitting: Orthopaedic Surgery

## 2012-05-20 DIAGNOSIS — IMO0002 Reserved for concepts with insufficient information to code with codable children: Secondary | ICD-10-CM

## 2012-08-04 ENCOUNTER — Emergency Department (HOSPITAL_COMMUNITY)
Admission: EM | Admit: 2012-08-04 | Discharge: 2012-08-04 | Disposition: A | Discharge status: Home / Self Care | Payer: Medicare Other | Attending: Emergency Medicine | Admitting: Emergency Medicine

## 2012-08-04 ENCOUNTER — Emergency Department (HOSPITAL_COMMUNITY): Payer: Medicare Other

## 2012-08-04 ENCOUNTER — Encounter (HOSPITAL_COMMUNITY): Payer: Self-pay | Admitting: Emergency Medicine

## 2012-08-04 DIAGNOSIS — I10 Essential (primary) hypertension: Secondary | ICD-10-CM | POA: Insufficient documentation

## 2012-08-04 DIAGNOSIS — Z7982 Long term (current) use of aspirin: Secondary | ICD-10-CM | POA: Insufficient documentation

## 2012-08-04 DIAGNOSIS — Z79899 Other long term (current) drug therapy: Secondary | ICD-10-CM | POA: Insufficient documentation

## 2012-08-04 DIAGNOSIS — F329 Major depressive disorder, single episode, unspecified: Secondary | ICD-10-CM | POA: Insufficient documentation

## 2012-08-04 DIAGNOSIS — F3289 Other specified depressive episodes: Secondary | ICD-10-CM | POA: Insufficient documentation

## 2012-08-04 DIAGNOSIS — H409 Unspecified glaucoma: Secondary | ICD-10-CM | POA: Insufficient documentation

## 2012-08-04 DIAGNOSIS — IMO0002 Reserved for concepts with insufficient information to code with codable children: Secondary | ICD-10-CM | POA: Insufficient documentation

## 2012-08-04 DIAGNOSIS — K219 Gastro-esophageal reflux disease without esophagitis: Secondary | ICD-10-CM | POA: Insufficient documentation

## 2012-08-04 DIAGNOSIS — M48061 Spinal stenosis, lumbar region without neurogenic claudication: Secondary | ICD-10-CM | POA: Insufficient documentation

## 2012-08-04 DIAGNOSIS — Z8781 Personal history of (healed) traumatic fracture: Secondary | ICD-10-CM | POA: Insufficient documentation

## 2012-08-04 DIAGNOSIS — M1710 Unilateral primary osteoarthritis, unspecified knee: Secondary | ICD-10-CM | POA: Insufficient documentation

## 2012-08-04 DIAGNOSIS — M25559 Pain in unspecified hip: Secondary | ICD-10-CM | POA: Insufficient documentation

## 2012-08-04 DIAGNOSIS — M810 Age-related osteoporosis without current pathological fracture: Secondary | ICD-10-CM | POA: Insufficient documentation

## 2012-08-04 HISTORY — DX: Major depressive disorder, single episode, unspecified: F32.9

## 2012-08-04 HISTORY — DX: Spinal stenosis, lumbar region without neurogenic claudication: M48.061

## 2012-08-04 HISTORY — DX: Unspecified glaucoma: H40.9

## 2012-08-04 HISTORY — DX: Gastro-esophageal reflux disease without esophagitis: K21.9

## 2012-08-04 HISTORY — DX: Unilateral primary osteoarthritis, right knee: M17.11

## 2012-08-04 HISTORY — DX: Essential (primary) hypertension: I10

## 2012-08-04 HISTORY — DX: Depression, unspecified: F32.A

## 2012-08-04 HISTORY — DX: Age-related osteoporosis without current pathological fracture: M81.0

## 2012-08-04 MED ORDER — OXYCODONE-ACETAMINOPHEN 5-325 MG PO TABS: 1.0000 | ORAL_TABLET | Freq: Four times a day (QID) | ORAL | Status: DC | PRN

## 2012-08-04 MED ORDER — OXYCODONE-ACETAMINOPHEN 5-325 MG PO TABS
1.0000 | ORAL_TABLET | Freq: Once | ORAL | Status: AC
  Administered 2012-08-04: 1 via ORAL
  Filled 2012-08-04: qty 1

## 2012-08-04 NOTE — ED Notes (Signed)
Pt came [facility]. Per EMS pt fell three weeks ago on right side and didn't get seen by MD, the past 3days pain has gradual gotten worse in right hip.  Degenerative joint dx, osteoporosis, left hip fracture repair, spinal stenosis, right DJD knee.

## 2012-08-04 NOTE — ED Notes (Signed)
OZH:[id number]<HQ> Expected date:08/04/12<BR> Expected time:12:55 PM<BR> Means of arrival:Ambulance<BR> Comments:<BR> Leg pain

## 2012-08-04 NOTE — ED Provider Notes (Signed)
History     CSN: [id number]  Arrival date & time 08/04/12  1308   First MD Initiated Contact with Patient 08/04/12 1332      Chief Complaint  Patient presents with  . Hip Pain    right    (Consider location/radiation/quality/duration/timing/severity/associated sxs/prior treatment) HPI Comments: Patient with history of osteoporosis, left hip and pelvic fractures in the past.  Presents today from assisted living for evaluation of pain in the right hip.  She fell about 4-5 weeks ago and her knees were evaluated by Dr. Magnus Ivan, but was not having pain in the right hip until about a week ago.  Per the patient and the daughter, there have been no falls or trauma in the interim.  She is able to ambulate with a walker but with discomfort.  Patient is a 79 y.o. female presenting with hip pain. The history is provided by the patient.  Hip Pain This is a new problem. Episode onset: one week ago. The problem occurs constantly. The problem has been gradually worsening. The symptoms are aggravated by walking. Nothing relieves the symptoms. She has tried nothing for the symptoms.    Past Medical History  Diagnosis Date  . GERD (gastroesophageal reflux disease)   . Hypertension   . Glaucoma   . Depression   . Lumbar spinal stenosis   . Right knee DJD   . Osteoporosis     Past Surgical History  Procedure Date  . Fracture surgery   . Neck surgery   . Eye surgery   . Abdominal hysterectomy     No family history on file.  History  Substance Use Topics  . Smoking status: Never Smoker   . Smokeless tobacco: Never Used  . Alcohol Use: No    OB History    Grav Para Term Preterm Abortions TAB SAB Ect Mult Living                  Review of Systems  All other systems reviewed and are negative.    Allergies  Penicillins and Betadine  Home Medications  No current outpatient prescriptions on file.  BP 136/65  Pulse 81  Temp 98.4 F (36.9 C)  Resp 18  SpO2 97%  Physical  Exam  Nursing note and vitals reviewed. Constitutional: She is oriented to person, place, and time. She appears well-developed and well-nourished. No distress.  HENT:  Head: Normocephalic and atraumatic.  Mouth/Throat: Oropharynx is clear and moist.  Neck: Normal range of motion. Neck supple.  Cardiovascular: Normal rate and regular rhythm.   Pulmonary/Chest: Effort normal and breath sounds normal.  Abdominal: Soft. Bowel sounds are normal. She exhibits no distension. There is no tenderness.  Musculoskeletal: Normal range of motion. She exhibits no edema.       The right hip appears grossly normal.  There is no deformity and no shortening or external rotation.  There is no pain with range of motion.  Pulse, motor, sensory are intact distally.  Neurological: She is alert and oriented to person, place, and time.  Skin: Skin is warm. She is not diaphoretic.    ED Course  Procedures (including critical care time)  Labs Reviewed - No data to display No results found.   No diagnosis found.    MDM  The xrays do not reveal an acute fracture.  She has good mobility and range of motion.  I will discharge her to the ecf.  I have prescribed a pain medication, okayed her to start  physical therapy.          Geoffery Lyons, MD 08/04/12 1450

## 2012-08-04 NOTE — ED Notes (Signed)
Patient transported to X-ray 

## 2013-04-23 ENCOUNTER — Encounter (HOSPITAL_COMMUNITY): Payer: Self-pay | Admitting: Emergency Medicine

## 2013-04-23 ENCOUNTER — Emergency Department (HOSPITAL_COMMUNITY): Payer: Medicare Other

## 2013-04-23 ENCOUNTER — Inpatient Hospital Stay (HOSPITAL_COMMUNITY)
Admission: EM | Admit: 2013-04-23 | Discharge: 2013-04-28 | DRG: 481 | Disposition: A | Discharge status: Skilled Nursing Facility | Payer: Medicare Other | Attending: Internal Medicine | Admitting: Internal Medicine

## 2013-04-23 DIAGNOSIS — W19XXXA Unspecified fall, initial encounter: Secondary | ICD-10-CM | POA: Diagnosis present

## 2013-04-23 DIAGNOSIS — M79609 Pain in unspecified limb: Secondary | ICD-10-CM

## 2013-04-23 DIAGNOSIS — I10 Essential (primary) hypertension: Secondary | ICD-10-CM | POA: Diagnosis present

## 2013-04-23 DIAGNOSIS — D62 Acute posthemorrhagic anemia: Secondary | ICD-10-CM | POA: Diagnosis present

## 2013-04-23 DIAGNOSIS — F3289 Other specified depressive episodes: Secondary | ICD-10-CM | POA: Diagnosis present

## 2013-04-23 DIAGNOSIS — S72009A Fracture of unspecified part of neck of unspecified femur, initial encounter for closed fracture: Secondary | ICD-10-CM

## 2013-04-23 DIAGNOSIS — S7291XA Unspecified fracture of right femur, initial encounter for closed fracture: Secondary | ICD-10-CM

## 2013-04-23 DIAGNOSIS — D649 Anemia, unspecified: Secondary | ICD-10-CM | POA: Diagnosis present

## 2013-04-23 DIAGNOSIS — Z79899 Other long term (current) drug therapy: Secondary | ICD-10-CM

## 2013-04-23 DIAGNOSIS — Y9210 Unspecified residential institution as the place of occurrence of the external cause: Secondary | ICD-10-CM | POA: Diagnosis present

## 2013-04-23 DIAGNOSIS — E871 Hypo-osmolality and hyponatremia: Secondary | ICD-10-CM | POA: Diagnosis present

## 2013-04-23 DIAGNOSIS — K219 Gastro-esophageal reflux disease without esophagitis: Secondary | ICD-10-CM | POA: Diagnosis present

## 2013-04-23 DIAGNOSIS — M810 Age-related osteoporosis without current pathological fracture: Secondary | ICD-10-CM | POA: Diagnosis present

## 2013-04-23 DIAGNOSIS — Z7982 Long term (current) use of aspirin: Secondary | ICD-10-CM

## 2013-04-23 DIAGNOSIS — S72143A Displaced intertrochanteric fracture of unspecified femur, initial encounter for closed fracture: Principal | ICD-10-CM | POA: Diagnosis present

## 2013-04-23 DIAGNOSIS — S72001A Fracture of unspecified part of neck of right femur, initial encounter for closed fracture: Secondary | ICD-10-CM

## 2013-04-23 DIAGNOSIS — F329 Major depressive disorder, single episode, unspecified: Secondary | ICD-10-CM | POA: Diagnosis present

## 2013-04-23 HISTORY — DX: Dizziness and giddiness: R42

## 2013-04-23 LAB — BASIC METABOLIC PANEL
CO2: 26 mEq/L (ref 19–32)
Glucose, Bld: 105 mg/dL — ABNORMAL HIGH (ref 70–99)
Potassium: 4.3 mEq/L (ref 3.5–5.1)
Sodium: 133 mEq/L — ABNORMAL LOW (ref 135–145)

## 2013-04-23 LAB — TYPE AND SCREEN

## 2013-04-23 LAB — CBC WITH DIFFERENTIAL/PLATELET
Eosinophils Absolute: 0.1 10*3/uL (ref 0.0–0.7)
Eosinophils Relative: 2 % (ref 0–5)
Hemoglobin: 11.7 g/dL — ABNORMAL LOW (ref 12.0–15.0)
Lymphs Abs: 1.1 10*3/uL (ref 0.7–4.0)
MCH: 31.1 pg (ref 26.0–34.0)
MCV: 93.4 fL (ref 78.0–100.0)
Monocytes Relative: 9 % (ref 3–12)
Neutrophils Relative %: 65 % (ref 43–77)
RBC: 3.76 MIL/uL — ABNORMAL LOW (ref 3.87–5.11)

## 2013-04-23 LAB — ABO/RH: ABO/RH(D): O POS

## 2013-04-23 LAB — PROTIME-INR: INR: 1.06 (ref 0.00–1.49)

## 2013-04-23 MED ORDER — MORPHINE SULFATE 4 MG/ML IJ SOLN
4.0000 mg | Freq: Once | INTRAMUSCULAR | Status: AC
  Administered 2013-04-23: 4 mg via INTRAVENOUS
  Filled 2013-04-23: qty 1

## 2013-04-23 MED ORDER — ONDANSETRON HCL 4 MG/2ML IJ SOLN
4.0000 mg | Freq: Once | INTRAMUSCULAR | Status: AC
  Administered 2013-04-23: 4 mg via INTRAVENOUS
  Filled 2013-04-23: qty 2

## 2013-04-23 NOTE — ED Notes (Signed)
Pt was trying to fix her blinds and fell and is now having right hip pain.  Was given of fentanyl prior to arrival

## 2013-04-23 NOTE — ED Notes (Signed)
Patient transported to XR. 

## 2013-04-23 NOTE — ED Notes (Signed)
Patient requests pain medication for pain of 10/10.

## 2013-04-23 NOTE — ED Provider Notes (Signed)
CSN: [id number]     Arrival date & time 04/23/13  1958 History   First MD Initiated Contact with Patient 04/23/13 2000     Chief Complaint  Patient presents with  . Fall   (Consider location/radiation/quality/duration/timing/severity/associated sxs/prior Treatment) HPI  This is a 79 year old female who presents following a fall. Patient states that she was trying to fix her blinds when she got dizzy and fell. She denies loss of consciousness. She denies hitting her head. She is only taking an aspirin. Patient reports right hip pain. She's not been angulatory since the fall. She denies any chest pain or shortness of breath. She denies any other injury. Patient does have a history of peripheral vertigo. Patient states that this episode is not uncommon for her. She looks up for too long she gets room spinning dizziness.  Past Medical History  Diagnosis Date  . GERD (gastroesophageal reflux disease)   . Hypertension   . Glaucoma   . Depression   . Lumbar spinal stenosis   . Right knee DJD   . Osteoporosis   . Vertigo    Past Surgical History  Procedure Laterality Date  . Fracture surgery    . Neck surgery    . Eye surgery    . Abdominal hysterectomy    . Hip fracture surgery Left    No family history on file. History  Substance Use Topics  . Smoking status: Never Smoker   . Smokeless tobacco: Never Used  . Alcohol Use: No   OB History   Grav Para Term Preterm Abortions TAB SAB Ect Mult Living                 Review of Systems  Constitutional: Negative for fever.  Respiratory: Negative for chest tightness and shortness of breath.   Cardiovascular: Negative for chest pain.  Gastrointestinal: Negative for nausea, vomiting and abdominal pain.  Genitourinary: Negative for dysuria.  Musculoskeletal: Negative for back pain and neck pain.       Right hip pain  Skin: Negative for wound.  Neurological: Negative for headaches.  Psychiatric/Behavioral: Negative for confusion.   All other systems reviewed and are negative.    Allergies  Macrolides and ketolides; Penicillins; and Betadine  Home Medications   Current Outpatient Rx  Name  Route  Sig  Dispense  Refill  . Alpha-Lipoic Acid 600 MG CAPS   Oral   Take 600 mg by mouth 2 (two) times daily. 8am, 5pm         . amLODipine (NORVASC) 5 MG tablet   Oral   Take 5 mg by mouth daily.         Marland Kitchen aspirin 81 MG chewable tablet   Oral   Chew 81 mg by mouth daily.         . bimatoprost (LUMIGAN) 0.01 % SOLN   Both Eyes   Place 1 drop into both eyes at bedtime.         . Cholecalciferol (VITAMIN D) 2000 UNITS CAPS   Oral   Take 2,000 Units by mouth daily.         . DULoxetine (CYMBALTA) 60 MG capsule   Oral   Take 60 mg by mouth daily.         . ferrous sulfate 325 (65 FE) MG tablet   Oral   Take 325 mg by mouth daily with breakfast.         . loratadine (CLARITIN) 10 MG tablet   Oral  Take 10 mg by mouth daily.         Marland Kitchen loteprednol (LOTEMAX) 0.5 % ophthalmic suspension   Both Eyes   Place 1 drop into both eyes 2 (two) times daily. 8am, 8pm         . metoprolol succinate (TOPROL-XL) 25 MG 24 hr tablet   Oral   Take 25 mg by mouth daily.         . Multiple Vitamins-Minerals (PRESERVISION AREDS 2) CAPS   Oral   Take 1 capsule by mouth 2 (two) times daily. 8am, 5pm         . omeprazole (PRILOSEC) 20 MG capsule   Oral   Take 20 mg by mouth 2 (two) times daily. 8am, 8pm         . OxyCODONE (OXYCONTIN) 10 mg T12A 12 hr tablet   Oral   Take 10 mg by mouth every 12 (twelve) hours. 8am, 8pm         . Polyethyl Glycol-Propyl Glycol (SYSTANE) 0.4-0.3 % SOLN   Both Eyes   Place 1 drop into both eyes 4 (four) times daily. 8am, 12pm, 5pm, 8pm         . polyethylene glycol (MIRALAX / GLYCOLAX) packet   Oral   Take 17 g by mouth daily. Mix with 8 oz liquid and drink         . QC MINERAL OIL HEAVY PO   Both Ears   Place 2 drops into both ears once a week. Every  Friday (for ear wax)         . senna (SENOKOT) 8.6 MG TABS tablet   Oral   Take 2 tablets by mouth 2 (two) times daily. 8am, 4pm         . timolol (BETIMOL) 0.5 % ophthalmic solution   Both Eyes   Place 1 drop into both eyes 2 (two) times daily. 8am, 5pm         . traMADol-acetaminophen (ULTRACET) 37.5-325 MG per tablet   Oral   Take 1 tablet by mouth 4 (four) times daily as needed for pain.          BP 140/118  Pulse 67  Temp(Src) 98.1 F (36.7 C) (Oral)  Resp 20  SpO2 93% Physical Exam  Nursing note and vitals reviewed. Constitutional: She is oriented to person, place, and time. She appears well-developed and well-nourished.  Elderly  HENT:  Head: Normocephalic and atraumatic.  Mouth/Throat: Oropharynx is clear and moist.  Eyes: EOM are normal. Pupils are equal, round, and reactive to light.  Neck: Normal range of motion. Neck supple.  No C-spine tenderness  Cardiovascular: Normal rate, regular rhythm and normal heart sounds.   No murmur heard. Pulmonary/Chest: Effort normal and breath sounds normal. No respiratory distress.  Abdominal: Soft. She exhibits no distension. There is no tenderness.  Musculoskeletal:  Tenderness to palpation over the right lateral hip. No foreshortening of the right extremity.   No contusion or ecchymosis noted. Neurovascularly intact distally.  Neurological: She is alert and oriented to person, place, and time.  Skin: Skin is warm and dry.  Psychiatric: She has a normal mood and affect.    ED Course  Procedures (including critical care time) Labs Review Labs Reviewed  BASIC METABOLIC PANEL - Abnormal; Notable for the following:    Sodium 133 (*)    Glucose, Bld 105 (*)    GFR calc non Af Amer 71 (*)    GFR calc Af Amer 83 (*)  All other components within normal limits  CBC WITH DIFFERENTIAL - Abnormal; Notable for the following:    RBC 3.76 (*)    Hemoglobin 11.7 (*)    HCT 35.1 (*)    All other components within normal  limits  PROTIME-INR  URINALYSIS, ROUTINE W REFLEX MICROSCOPIC  TYPE AND SCREEN  ABO/RH   Imaging Review Dg Hip Complete Right  04/23/2013   CLINICAL DATA:  Fall. Right hip pain.  EXAM: RIGHT HIP - COMPLETE 2+ VIEW  COMPARISON:  08/04/2012  FINDINGS: There is a fracture of the proximal right femur. The primary fractures across the base of the femoral neck with a secondary fracture across the greater trochanter. The major fracture fragments are displaced by 1 cm and there is varus angulation. There is an old healed fracture of the inferior right pubic ramus. An old left proximal femur fracture has been reduced with a compression screw and fixation plate. The orthopedic hardware is well-seated and aligned and the fracture well healed.  The bones are extensively demineralized.  IMPRESSION: Right proximal femur fracture with the fracture across the base of the femoral neck with a secondary fracture of the greater trochanter with mild displacement and varus angulation.   Electronically Signed   By: Amie Portland M.D.   On: 04/23/2013 21:48   Dg Femur Right  04/23/2013   CLINICAL DATA:  Fall. Right hip pain.  EXAM: RIGHT FEMUR - 2 VIEW  COMPARISON:  08/04/2012  FINDINGS: There is a fracture of the proximal right femur. This appears to the across the base of the femoral neck. It may involve the greater trochanter which would make this an intertrochanteric fracture. The major fracture fragments are displaced by 1 cm and there is varus angulation.  No other evidence of a fracture. The knee and hip joints are normally aligned. The bones are extensively demineralized. Dense vascular calcification is noted along the superficial femoral artery.  IMPRESSION: Right proximal femur fracture. This is either across the base of the femoral neck or across the base the femoral neck to include the greater trochanter. Is mildly displaced with varus angulation.   Electronically Signed   By: Amie Portland M.D.   On: 04/23/2013  21:47    EKG Interpretation   None       MDM   1. Femur fracture, right, closed, initial encounter     This is a 79 year old female who presents with right hip pain following a fall. Patient reports vertiginous symptoms prior to falling. She has a history of vertigo and states that the symptoms are classic for her. She is nontoxic-appearing on exam and her vital signs are reassuring. She has no evidence of head or neck trauma and her C-spine was cleared by Nexus criteria. Plain films of the right femur are notable for a femoral neck fracture and a fracture across the greater trochanter. Orthopedics was consulted. She will be admitted to the hospitalist for further management.   Shon Baton, MD 04/23/13 [phone]

## 2013-04-24 ENCOUNTER — Encounter (HOSPITAL_COMMUNITY): Payer: Medicare Other | Admitting: Anesthesiology

## 2013-04-24 ENCOUNTER — Inpatient Hospital Stay (HOSPITAL_COMMUNITY): Payer: Medicare Other | Admitting: Anesthesiology

## 2013-04-24 ENCOUNTER — Encounter (HOSPITAL_COMMUNITY)
Admission: EM | Disposition: A | Discharge status: Skilled Nursing Facility | Payer: Self-pay | Source: Home / Self Care | Attending: Internal Medicine

## 2013-04-24 ENCOUNTER — Inpatient Hospital Stay (HOSPITAL_COMMUNITY): Payer: Medicare Other

## 2013-04-24 ENCOUNTER — Encounter (HOSPITAL_COMMUNITY): Payer: Self-pay | Admitting: Internal Medicine

## 2013-04-24 DIAGNOSIS — S72009A Fracture of unspecified part of neck of unspecified femur, initial encounter for closed fracture: Secondary | ICD-10-CM

## 2013-04-24 DIAGNOSIS — D649 Anemia, unspecified: Secondary | ICD-10-CM | POA: Diagnosis present

## 2013-04-24 DIAGNOSIS — S7290XA Unspecified fracture of unspecified femur, initial encounter for closed fracture: Secondary | ICD-10-CM

## 2013-04-24 DIAGNOSIS — I10 Essential (primary) hypertension: Secondary | ICD-10-CM | POA: Diagnosis present

## 2013-04-24 HISTORY — PX: INTRAMEDULLARY (IM) NAIL INTERTROCHANTERIC: SHX5875

## 2013-04-24 LAB — CBC WITH DIFFERENTIAL/PLATELET
Basophils Relative: 0 % (ref 0–1)
Eosinophils Absolute: 0 10*3/uL (ref 0.0–0.7)
Eosinophils Relative: 0 % (ref 0–5)
HCT: 36.9 % (ref 36.0–46.0)
Lymphocytes Relative: 10 % — ABNORMAL LOW (ref 12–46)
Lymphs Abs: 0.9 10*3/uL (ref 0.7–4.0)
MCH: 31.3 pg (ref 26.0–34.0)
MCV: 92.3 fL (ref 78.0–100.0)
Monocytes Absolute: 0.6 10*3/uL (ref 0.1–1.0)
Neutro Abs: 8.2 10*3/uL — ABNORMAL HIGH (ref 1.7–7.7)
Platelets: 239 10*3/uL (ref 150–400)
RBC: 4 MIL/uL (ref 3.87–5.11)
WBC: 9.8 10*3/uL (ref 4.0–10.5)

## 2013-04-24 LAB — COMPREHENSIVE METABOLIC PANEL
AST: 22 U/L (ref 0–37)
Albumin: 3.5 g/dL (ref 3.5–5.2)
Alkaline Phosphatase: 145 U/L — ABNORMAL HIGH (ref 39–117)
BUN: 13 mg/dL (ref 6–23)
Calcium: 8.7 mg/dL (ref 8.4–10.5)
Chloride: 93 mEq/L — ABNORMAL LOW (ref 96–112)
Creatinine, Ser: 0.55 mg/dL (ref 0.50–1.10)
GFR calc Af Amer: >90 mL/min (ref >90)
GFR calc non Af Amer: 80 mL/min — ABNORMAL LOW (ref >90)
Glucose, Bld: 147 mg/dL — ABNORMAL HIGH (ref 70–99)
Potassium: 4.1 mEq/L (ref 3.5–5.1)
Total Bilirubin: 0.3 mg/dL (ref 0.3–1.2)
Total Protein: 6.8 g/dL (ref 6.0–8.3)

## 2013-04-24 LAB — URINALYSIS, ROUTINE W REFLEX MICROSCOPIC
Bilirubin Urine: NEGATIVE
Glucose, UA: NEGATIVE mg/dL
Hgb urine dipstick: NEGATIVE
Ketones, ur: NEGATIVE mg/dL
Nitrite: NEGATIVE
Protein, ur: NEGATIVE mg/dL
Specific Gravity, Urine: 1.014 (ref 1.005–1.030)
Urobilinogen, UA: 0.2 mg/dL (ref 0.0–1.0)
pH: 7 (ref 5.0–8.0)

## 2013-04-24 LAB — URINE MICROSCOPIC-ADD ON

## 2013-04-24 LAB — TSH: TSH: 2.679 u[IU]/mL (ref 0.350–4.500)

## 2013-04-24 LAB — GLUCOSE, CAPILLARY
Glucose-Capillary: 149 mg/dL — ABNORMAL HIGH (ref 70–99)
Glucose-Capillary: 125 mg/dL — ABNORMAL HIGH (ref 70–99)
Glucose-Capillary: 128 mg/dL — ABNORMAL HIGH (ref 70–99)

## 2013-04-24 LAB — SURGICAL PCR SCREEN: MRSA, PCR: POSITIVE — AB

## 2013-04-24 SURGERY — FIXATION, FRACTURE, INTERTROCHANTERIC, WITH INTRAMEDULLARY ROD
Anesthesia: General | Site: Hip | Laterality: Right | Wound class: Clean

## 2013-04-24 MED ORDER — SENNA 8.6 MG PO TABS
2.0000 | ORAL_TABLET | Freq: Two times a day (BID) | ORAL | Status: DC
  Administered 2013-04-25 – 2013-04-28 (×4): 17.2 mg via ORAL
  Filled 2013-04-24 (×11): qty 2

## 2013-04-24 MED ORDER — TRAMADOL HCL 50 MG PO TABS: 50.0000 mg | ORAL_TABLET | Freq: Four times a day (QID) | ORAL | Status: DC | PRN

## 2013-04-24 MED ORDER — METOCLOPRAMIDE HCL 5 MG PO TABS: 5.0000 mg | ORAL_TABLET | Freq: Three times a day (TID) | ORAL | Status: DC | PRN

## 2013-04-24 MED ORDER — LACTATED RINGERS IV SOLN
INTRAVENOUS | Status: DC | PRN
  Administered 2013-04-24 (×2): via INTRAVENOUS

## 2013-04-24 MED ORDER — PHENYLEPHRINE HCL 10 MG/ML IJ SOLN
INTRAMUSCULAR | Status: DC | PRN
  Administered 2013-04-24: 80 ug via INTRAVENOUS
  Administered 2013-04-24 (×2): 120 ug via INTRAVENOUS

## 2013-04-24 MED ORDER — FENTANYL CITRATE 0.05 MG/ML IJ SOLN
INTRAMUSCULAR | Status: DC | PRN
  Administered 2013-04-24 (×2): 50 ug via INTRAVENOUS

## 2013-04-24 MED ORDER — LORATADINE 10 MG PO TABS
10.0000 mg | ORAL_TABLET | Freq: Every day | ORAL | Status: DC
  Administered 2013-04-24 – 2013-04-28 (×5): 10 mg via ORAL
  Filled 2013-04-24 (×5): qty 1

## 2013-04-24 MED ORDER — CLINDAMYCIN PHOSPHATE 600 MG/50ML IV SOLN
600.0000 mg | Freq: Four times a day (QID) | INTRAVENOUS | Status: AC
  Administered 2013-04-24 – 2013-04-25 (×2): 600 mg via INTRAVENOUS
  Filled 2013-04-24 (×2): qty 50

## 2013-04-24 MED ORDER — ONDANSETRON HCL 4 MG/2ML IJ SOLN: 4.0000 mg | Freq: Once | INTRAMUSCULAR | Status: DC | PRN

## 2013-04-24 MED ORDER — PANTOPRAZOLE SODIUM 40 MG PO TBEC
40.0000 mg | DELAYED_RELEASE_TABLET | Freq: Every day | ORAL | Status: DC
  Administered 2013-04-24 – 2013-04-28 (×5): 40 mg via ORAL
  Filled 2013-04-24 (×4): qty 1

## 2013-04-24 MED ORDER — HYDROCODONE-ACETAMINOPHEN 5-325 MG PO TABS
1.0000 | ORAL_TABLET | Freq: Four times a day (QID) | ORAL | Status: DC | PRN
  Administered 2013-04-24 (×2): 2 via ORAL
  Filled 2013-04-24 (×2): qty 2

## 2013-04-24 MED ORDER — HYDROMORPHONE HCL PF 1 MG/ML IJ SOLN
0.2500 mg | INTRAMUSCULAR | Status: DC | PRN
  Administered 2013-04-24: 0.5 mg via INTRAVENOUS

## 2013-04-24 MED ORDER — DULOXETINE HCL 60 MG PO CPEP
60.0000 mg | ORAL_CAPSULE | Freq: Every day | ORAL | Status: DC
  Administered 2013-04-24 – 2013-04-28 (×5): 60 mg via ORAL
  Filled 2013-04-24 (×5): qty 1

## 2013-04-24 MED ORDER — POLYETHYLENE GLYCOL 3350 17 G PO PACK
17.0000 g | PACK | Freq: Every day | ORAL | Status: DC
  Administered 2013-04-25 – 2013-04-28 (×2): 17 g via ORAL
  Filled 2013-04-24 (×5): qty 1

## 2013-04-24 MED ORDER — FERROUS SULFATE 325 (65 FE) MG PO TABS
325.0000 mg | ORAL_TABLET | Freq: Every day | ORAL | Status: DC
  Administered 2013-04-24 – 2013-04-28 (×5): 325 mg via ORAL
  Filled 2013-04-24 (×6): qty 1

## 2013-04-24 MED ORDER — HYDRALAZINE HCL 20 MG/ML IJ SOLN: 10.0000 mg | INTRAMUSCULAR | Status: DC | PRN

## 2013-04-24 MED ORDER — ASPIRIN EC 81 MG PO TBEC: 81.0000 mg | DELAYED_RELEASE_TABLET | Freq: Every day | ORAL | Status: DC

## 2013-04-24 MED ORDER — MUPIROCIN 2 % EX OINT
1.0000 "application " | TOPICAL_OINTMENT | Freq: Two times a day (BID) | CUTANEOUS | Status: DC
  Administered 2013-04-24 – 2013-04-28 (×9): 1 via NASAL
  Filled 2013-04-24: qty 22

## 2013-04-24 MED ORDER — CLINDAMYCIN PHOSPHATE 900 MG/50ML IV SOLN
900.0000 mg | INTRAVENOUS | Status: AC
  Administered 2013-04-24: 900 mg via INTRAVENOUS
  Filled 2013-04-24: qty 50

## 2013-04-24 MED ORDER — 0.9 % SODIUM CHLORIDE (POUR BTL) OPTIME
TOPICAL | Status: DC | PRN
  Administered 2013-04-24: 1000 mL

## 2013-04-24 MED ORDER — ACETAMINOPHEN 325 MG PO TABS: 650.0000 mg | ORAL_TABLET | Freq: Four times a day (QID) | ORAL | Status: DC | PRN

## 2013-04-24 MED ORDER — METOPROLOL SUCCINATE ER 25 MG PO TB24
25.0000 mg | ORAL_TABLET | Freq: Every day | ORAL | Status: DC
  Administered 2013-04-24 – 2013-04-26 (×3): 25 mg via ORAL
  Filled 2013-04-24 (×4): qty 1

## 2013-04-24 MED ORDER — SODIUM CHLORIDE 0.9 % IV SOLN
INTRAVENOUS | Status: DC
  Administered 2013-04-24: 02:00:00 via INTRAVENOUS

## 2013-04-24 MED ORDER — HYDROMORPHONE HCL PF 1 MG/ML IJ SOLN
0.1000 mg | INTRAMUSCULAR | Status: DC | PRN
  Administered 2013-04-24 – 2013-04-25 (×2): 0.1 mg via INTRAVENOUS
  Filled 2013-04-24 (×2): qty 1

## 2013-04-24 MED ORDER — LATANOPROST 0.005 % OP SOLN
1.0000 [drp] | Freq: Every day | OPHTHALMIC | Status: DC
  Administered 2013-04-24 – 2013-04-28 (×5): 1 [drp] via OPHTHALMIC
  Filled 2013-04-24: qty 2.5

## 2013-04-24 MED ORDER — HYDROCODONE-ACETAMINOPHEN 5-325 MG PO TABS
1.0000 | ORAL_TABLET | Freq: Four times a day (QID) | ORAL | Status: DC | PRN
  Administered 2013-04-25 (×2): 1 via ORAL
  Administered 2013-04-25: 2 via ORAL
  Administered 2013-04-26 (×2): 1 via ORAL
  Administered 2013-04-26: 2 via ORAL
  Administered 2013-04-26 – 2013-04-27 (×3): 1 via ORAL
  Administered 2013-04-27 – 2013-04-28 (×3): 2 via ORAL
  Administered 2013-04-28: 1 via ORAL
  Administered 2013-04-28: 2 via ORAL
  Filled 2013-04-24 (×2): qty 1
  Filled 2013-04-24 (×2): qty 2
  Filled 2013-04-24 (×2): qty 1
  Filled 2013-04-24: qty 2
  Filled 2013-04-24: qty 1
  Filled 2013-04-24 (×4): qty 2
  Filled 2013-04-24: qty 1
  Filled 2013-04-24: qty 2

## 2013-04-24 MED ORDER — ASPIRIN EC 325 MG PO TBEC
325.0000 mg | DELAYED_RELEASE_TABLET | Freq: Every day | ORAL | Status: DC
  Administered 2013-04-25 – 2013-04-28 (×4): 325 mg via ORAL
  Filled 2013-04-24 (×5): qty 1

## 2013-04-24 MED ORDER — PHENOL 1.4 % MT LIQD: 1.0000 | OROMUCOSAL | Status: DC | PRN

## 2013-04-24 MED ORDER — HYDROMORPHONE HCL PF 1 MG/ML IJ SOLN
INTRAMUSCULAR | Status: AC
  Filled 2013-04-24: qty 1

## 2013-04-24 MED ORDER — ONDANSETRON HCL 4 MG PO TABS: 4.0000 mg | ORAL_TABLET | Freq: Four times a day (QID) | ORAL | Status: DC | PRN

## 2013-04-24 MED ORDER — TIMOLOL HEMIHYDRATE 0.5 % OP SOLN: 1.0000 [drp] | Freq: Two times a day (BID) | OPHTHALMIC | Status: DC

## 2013-04-24 MED ORDER — LIDOCAINE HCL (CARDIAC) 20 MG/ML IV SOLN
INTRAVENOUS | Status: DC | PRN
  Administered 2013-04-24: 100 mg via INTRAVENOUS

## 2013-04-24 MED ORDER — PROPOFOL 10 MG/ML IV BOLUS
INTRAVENOUS | Status: DC | PRN
  Administered 2013-04-24: 100 mg via INTRAVENOUS

## 2013-04-24 MED ORDER — MORPHINE SULFATE 2 MG/ML IJ SOLN
0.5000 mg | INTRAMUSCULAR | Status: DC | PRN
  Administered 2013-04-24 – 2013-04-25 (×7): 0.5 mg via INTRAVENOUS
  Filled 2013-04-24 (×7): qty 1

## 2013-04-24 MED ORDER — METOCLOPRAMIDE HCL 5 MG/ML IJ SOLN: 5.0000 mg | Freq: Three times a day (TID) | INTRAMUSCULAR | Status: DC | PRN

## 2013-04-24 MED ORDER — TIMOLOL MALEATE 0.5 % OP SOLN
1.0000 [drp] | Freq: Two times a day (BID) | OPHTHALMIC | Status: DC
  Administered 2013-04-24 – 2013-04-28 (×9): 1 [drp] via OPHTHALMIC
  Filled 2013-04-24: qty 5

## 2013-04-24 MED ORDER — LACTATED RINGERS IV SOLN: INTRAVENOUS | Status: DC

## 2013-04-24 MED ORDER — AMLODIPINE BESYLATE 5 MG PO TABS
5.0000 mg | ORAL_TABLET | Freq: Every day | ORAL | Status: DC
  Administered 2013-04-24 – 2013-04-28 (×4): 5 mg via ORAL
  Filled 2013-04-24 (×5): qty 1

## 2013-04-24 MED ORDER — ACETAMINOPHEN 650 MG RE SUPP: 650.0000 mg | Freq: Four times a day (QID) | RECTAL | Status: DC | PRN

## 2013-04-24 MED ORDER — CHLORHEXIDINE GLUCONATE CLOTH 2 % EX PADS
6.0000 | MEDICATED_PAD | Freq: Every day | CUTANEOUS | Status: DC
  Administered 2013-04-24 – 2013-04-25 (×2): 6 via TOPICAL

## 2013-04-24 MED ORDER — MENTHOL 3 MG MT LOZG
1.0000 | LOZENGE | OROMUCOSAL | Status: DC | PRN
  Administered 2013-04-25: 3 mg via ORAL
  Filled 2013-04-24 (×2): qty 9

## 2013-04-24 MED ORDER — OCUVITE-LUTEIN PO CAPS
1.0000 | ORAL_CAPSULE | Freq: Every day | ORAL | Status: DC
  Administered 2013-04-24 – 2013-04-28 (×5): 1 via ORAL
  Filled 2013-04-24 (×5): qty 1

## 2013-04-24 MED ORDER — ONDANSETRON HCL 4 MG/2ML IJ SOLN: 4.0000 mg | Freq: Four times a day (QID) | INTRAMUSCULAR | Status: DC | PRN

## 2013-04-24 MED ORDER — LOTEPREDNOL ETABONATE 0.5 % OP SUSP
1.0000 [drp] | Freq: Two times a day (BID) | OPHTHALMIC | Status: DC
  Administered 2013-04-24 – 2013-04-28 (×9): 1 [drp] via OPHTHALMIC
  Filled 2013-04-24: qty 5

## 2013-04-24 MED ORDER — SODIUM CHLORIDE 0.9 % IV SOLN
INTRAVENOUS | Status: DC
  Administered 2013-04-24: 20 mL/h via INTRAVENOUS

## 2013-04-24 MED ORDER — PRESERVISION AREDS 2 PO CAPS: 1.0000 | ORAL_CAPSULE | Freq: Two times a day (BID) | ORAL | Status: DC

## 2013-04-24 MED ORDER — ALPHA-LIPOIC ACID 600 MG PO CAPS: 600.0000 mg | ORAL_CAPSULE | Freq: Two times a day (BID) | ORAL | Status: DC

## 2013-04-24 SURGICAL SUPPLY — 37 items
BIT DRILL CANN LG 4.3MM (BIT) IMPLANT
BLADE SURG 15 STRL LF DISP TIS (BLADE) ×1 IMPLANT
BLADE SURG 15 STRL SS (BLADE) ×2
CLOTH BEACON ORANGE TIMEOUT ST (SAFETY) ×2 IMPLANT
COVER SURGICAL LIGHT HANDLE (MISCELLANEOUS) ×2 IMPLANT
COVER TABLE BACK 60X90 (DRAPES) ×2 IMPLANT
DRAPE C-ARM 42X72 X-RAY (DRAPES) ×2 IMPLANT
DRAPE STERI IOBAN 125X83 (DRAPES) ×2 IMPLANT
DRILL BIT CANN LG 4.3MM (BIT) ×2
DRSG ADAPTIC 3X8 NADH LF (GAUZE/BANDAGES/DRESSINGS) ×2 IMPLANT
DRSG MEPILEX BORDER 4X4 (GAUZE/BANDAGES/DRESSINGS) ×2 IMPLANT
DRSG MEPILEX BORDER 4X8 (GAUZE/BANDAGES/DRESSINGS) ×2 IMPLANT
DRSG PAD ABDOMINAL 8X10 ST (GAUZE/BANDAGES/DRESSINGS) ×1 IMPLANT
ELECT REM PT RETURN 9FT ADLT (ELECTROSURGICAL) ×2
ELECTRODE REM PT RTRN 9FT ADLT (ELECTROSURGICAL) ×1 IMPLANT
EVACUATOR 1/8 PVC DRAIN (DRAIN) IMPLANT
GLOVE BIOGEL PI IND STRL 9 (GLOVE) ×1 IMPLANT
GLOVE BIOGEL PI INDICATOR 9 (GLOVE) ×1
GLOVE SURG ORTHO 9.0 STRL STRW (GLOVE) ×2 IMPLANT
GOWN PREVENTION PLUS XLARGE (GOWN DISPOSABLE) ×2 IMPLANT
GOWN SRG XL XLNG 56XLVL 4 (GOWN DISPOSABLE) ×2 IMPLANT
GOWN STRL NON-REIN XL XLG LVL4 (GOWN DISPOSABLE) ×4
GUIDEPIN 3.2X17.5 THRD DISP (PIN) ×1 IMPLANT
HIP FRAC NAIL LAG SCR 10.5X100 (Orthopedic Implant) ×1 IMPLANT
KIT BASIN OR (CUSTOM PROCEDURE TRAY) ×2 IMPLANT
KIT ROOM TURNOVER OR (KITS) ×2 IMPLANT
MANIFOLD NEPTUNE II (INSTRUMENTS) ×2 IMPLANT
NAIL HIP FRACT 130D 11X180 (Screw) ×1 IMPLANT
NS IRRIG 1000ML POUR BTL (IV SOLUTION) ×2 IMPLANT
PACK GENERAL/GYN (CUSTOM PROCEDURE TRAY) ×2 IMPLANT
PAD ARMBOARD 7.5X6 YLW CONV (MISCELLANEOUS) ×4 IMPLANT
SCREW BONE CORTICAL 5.0X3 (Screw) ×1 IMPLANT
SCREW CANN THRD AFF 10.5X100 (Orthopedic Implant) IMPLANT
STAPLER VISISTAT 35W (STAPLE) IMPLANT
SUT VIC AB 2-0 CTB1 (SUTURE) IMPLANT
TAPE CLOTH SURG 6X10 WHT LF (GAUZE/BANDAGES/DRESSINGS) ×1 IMPLANT
WATER STERILE IRR 1000ML POUR (IV SOLUTION) ×4 IMPLANT

## 2013-04-24 NOTE — Progress Notes (Signed)
INITIAL NUTRITION ASSESSMENT  DOCUMENTATION CODES Per approved criteria  -Not Applicable   INTERVENTION: -Add Ensure Complete po BID, each supplement provides 350 kcal and 13 grams of protein once diet advanced.  NUTRITION DIAGNOSIS: Inadequate oral intake related to inability to eat as evidenced by NPO.   Goal: Pt to meet >/= 90% of their estimated nutrition needs   Monitor:  Weight trends, po intake, labs  Reason for Assessment: Consult- Hip fx protocol  79 y.o. female  Admitting Dx: Hip fracture  ASSESSMENT: Pt admitted after experiencing at fall at nursing home that resulted in a right hip fracture. Pt currently NPO for surgery later today.   Per pt and pt's daughter, pt eats well. She reports consuming ensure and yogurt daily. Pt reports that she would like to receive Ensure Plus after her surgery. She reports no recent weight loss.   Height: Ht Readings from Last 1 Encounters:  04/24/13 5' (1.524 m)    Weight: Wt Readings from Last 1 Encounters:  04/24/13 131 lb (59.421 kg)    Ideal Body Weight: 45.5 kg  % Ideal Body Weight: 131%  Wt Readings from Last 10 Encounters:  04/24/13 131 lb (59.421 kg)  04/24/13 131 lb (59.421 kg)    Usual Body Weight: 126-130 lbs  % Usual Body Weight: 101%  BMI:  Body mass index is 25.58 kg/(m^2).  Estimated Nutritional Needs: Kcal: 1500-1700 Protein: 70-85 g Fluid: >1.5 L  Skin: WNL  Diet Order: NPO  EDUCATION NEEDS: -No education needs identified at this time   Intake/Output Summary (Last 24 hours) at 04/24/13 1424 Last data filed at 04/24/13 1320  Gross per 24 hour  Intake      9 ml  Output   1320 ml  Net  -1311 ml    Last BM: PTA   Labs:   Recent Labs Lab 04/23/13 2016 04/24/13 0550  NA 133* 131*  K 4.3 4.1  CL 97 93*  CO2 26 25  BUN 17 13  CREATININE 0.78 0.55  CALCIUM 8.4 8.7  GLUCOSE 105* 147*    CBG (last 3)   Recent Labs  04/24/13 0255 04/24/13 0843 04/24/13 1159  GLUCAP  149* 125* 128*    Scheduled Meds: . [MAR HOLD] amLODipine  5 mg Oral Daily  . [MAR HOLD] Chlorhexidine Gluconate Cloth  6 each Topical Q0600  . clindamycin (CLEOCIN) IV  900 mg Intravenous On Call to OR  . [facility] HOLD] DULoxetine  60 mg Oral Daily  . [facility] HOLD] ferrous sulfate  325 mg Oral Q breakfast  . [MAR HOLD] latanoprost  1 drop Both Eyes QHS  . [facility] HOLD] loratadine  10 mg Oral Daily  . [MAR HOLD] loteprednol  1 drop Both Eyes BID  . [facility] HOLD] metoprolol succinate  25 mg Oral Daily  . [MAR HOLD] multivitamin-lutein  1 capsule Oral Daily  . [facility] HOLD] mupirocin ointment  1 application Nasal BID  . [MAR HOLD] pantoprazole  40 mg Oral Daily  . [MAR HOLD] polyethylene glycol  17 g Oral Daily  . [MAR HOLD] senna  2 tablet Oral BID  . [facility] HOLD] timolol  1 drop Both Eyes BID    Continuous Infusions: . sodium chloride 10 mL/hr at 04/24/13 0206  . lactated ringers      Past Medical History  Diagnosis Date  . GERD (gastroesophageal reflux disease)   . Hypertension   . Glaucoma   . Depression   . Lumbar spinal stenosis   . Right  knee DJD   . Osteoporosis   . Vertigo     Past Surgical History  Procedure Laterality Date  . Fracture surgery    . Neck surgery    . Eye surgery    . Abdominal hysterectomy    . Hip fracture surgery Left     Ebbie Latus RD, LDN

## 2013-04-24 NOTE — Progress Notes (Signed)
Patient admitted early this AM by Dr. Toniann Fail.  Please see H&P  Right hip fracture - from medical stand point of view patient looks medically stable for surgery.plan for surgery tooday -pain relief medications.   Hypertension - continue present medications. Patient will be also on when necessary IV hydralazine for systolic blood pressure more than 160.   Anemia - follow CBC closely  Marlin Canary DO

## 2013-04-24 NOTE — ED Notes (Signed)
Ilanna Deihl patient's son: [phone]

## 2013-04-24 NOTE — Anesthesia Postprocedure Evaluation (Signed)
  Anesthesia Post-op Note  Patient: Yvette Carroll  Procedure(s) Performed: Procedure(s): INTRAMEDULLARY (IM) NAIL INTERTROCHANTRIC (Right)  Patient Location: PACU  Anesthesia Type:General  Level of Consciousness: awake, sedated and patient cooperative  Airway and Oxygen Therapy: Patient Spontanous Breathing  Post-op Pain: mild  Post-op Assessment: Post-op Vital signs reviewed, Patient's Cardiovascular Status Stable, Respiratory Function Stable, Patent Airway, No signs of Nausea or vomiting and Pain level controlled  Post-op Vital Signs: stable  Complications: No apparent anesthesia complications

## 2013-04-24 NOTE — Anesthesia Procedure Notes (Signed)
Procedure Name: Intubation Date/Time: 04/24/2013 3:39 PM Performed by: Coralee Rud Pre-anesthesia Checklist: Patient identified, Emergency Drugs available, Suction available and Patient being monitored Patient Re-evaluated:Patient Re-evaluated prior to inductionOxygen Delivery Method: Circle system utilized Preoxygenation: Pre-oxygenation with 100% oxygen Intubation Type: IV induction Ventilation: Mask ventilation without difficulty Laryngoscope Size: Miller and 3 Grade View: Grade II Tube type: Oral Tube size: 7.5 mm Number of attempts: 1 Airway Equipment and Method: Stylet

## 2013-04-24 NOTE — H&P (Signed)
[facility] History and Physical  Yvette Carroll EAV:[id number] DOB: [birth year] DOA: 04/23/2013  Referring physician: ER physician. PCP: Pearson Grippe, MD   Chief Complaint: Fall.  HPI: Yvette Carroll is a 79 y.o. female was brought from the nursing home after patient had a fall. Patient states she was raising her head when she fell very dizzy and fell. She did not lose consciousness or did not have any focal deficits. Patient had pain in the right hip and was brought to the ER and x-rays reveal right hip fracture. Patient has been admitted for further management. On-call orthopedic surgeon Dr. Lajoyce Corners was consulted. Patient denies any chest pain or shortness of breath. Patient has chronic vertigo and chronic pain.   Review of Systems: As presented in the history of presenting illness, rest negative.  Past Medical History  Diagnosis Date  . GERD (gastroesophageal reflux disease)   . Hypertension   . Glaucoma   . Depression   . Lumbar spinal stenosis   . Right knee DJD   . Osteoporosis   . Vertigo    Past Surgical History  Procedure Laterality Date  . Fracture surgery    . Neck surgery    . Eye surgery    . Abdominal hysterectomy    . Hip fracture surgery Left    Social History:  reports that she has never smoked. She has never used smokeless tobacco. She reports that she does not drink alcohol or use illicit drugs. Where does patient live nursing home. Can patient participate in ADLs? Yes.  Allergies  Allergen Reactions  . Macrolides And Ketolides Other (See Comments)    Per MAR  . Penicillins Swelling  . Betadine [Povidone Iodine] Itching and Rash    Family History: History reviewed. No pertinent family history.    Prior to Admission medications   Medication Sig Start Date End Date Taking? Authorizing Provider  Alpha-Lipoic Acid 600 MG CAPS Take 600 mg by mouth 2 (two) times daily. 8am, 5pm   Yes Historical Provider, MD  amLODipine (NORVASC) 5 MG tablet Take 5 mg by  mouth daily.   Yes Historical Provider, MD  aspirin 81 MG chewable tablet Chew 81 mg by mouth daily.   Yes Historical Provider, MD  bimatoprost (LUMIGAN) 0.01 % SOLN Place 1 drop into both eyes at bedtime.   Yes Historical Provider, MD  Cholecalciferol (VITAMIN D) 2000 UNITS CAPS Take 2,000 Units by mouth daily.   Yes Historical Provider, MD  DULoxetine (CYMBALTA) 60 MG capsule Take 60 mg by mouth daily.   Yes Historical Provider, MD  ferrous sulfate 325 (65 FE) MG tablet Take 325 mg by mouth daily with breakfast.   Yes Historical Provider, MD  loratadine (CLARITIN) 10 MG tablet Take 10 mg by mouth daily.   Yes Historical Provider, MD  loteprednol (LOTEMAX) 0.5 % ophthalmic suspension Place 1 drop into both eyes 2 (two) times daily. 8am, 8pm   Yes Historical Provider, MD  metoprolol succinate (TOPROL-XL) 25 MG 24 hr tablet Take 25 mg by mouth daily.   Yes Historical Provider, MD  Multiple Vitamins-Minerals (PRESERVISION AREDS 2) CAPS Take 1 capsule by mouth 2 (two) times daily. 8am, 5pm   Yes Historical Provider, MD  omeprazole (PRILOSEC) 20 MG capsule Take 20 mg by mouth 2 (two) times daily. 8am, 8pm   Yes Historical Provider, MD  OxyCODONE (OXYCONTIN) 10 mg T12A 12 hr tablet Take 10 mg by mouth every 12 (twelve) hours. 8am, 8pm   Yes Historical Provider,  MD  Polyethyl Glycol-Propyl Glycol (SYSTANE) 0.4-0.3 % SOLN Place 1 drop into both eyes 4 (four) times daily. 8am, 12pm, 5pm, 8pm   Yes Historical Provider, MD  polyethylene glycol (MIRALAX / GLYCOLAX) packet Take 17 g by mouth daily. Mix with 8 oz liquid and drink   Yes Historical Provider, MD  QC MINERAL OIL HEAVY PO Place 2 drops into both ears once a week. Every Friday (for ear wax)   Yes Historical Provider, MD  senna (SENOKOT) 8.6 MG TABS tablet Take 2 tablets by mouth 2 (two) times daily. 8am, 4pm   Yes Historical Provider, MD  timolol (BETIMOL) 0.5 % ophthalmic solution Place 1 drop into both eyes 2 (two) times daily. 8am, 5pm   Yes  Historical Provider, MD  traMADol-acetaminophen (ULTRACET) 37.5-325 MG per tablet Take 1 tablet by mouth 4 (four) times daily as needed for pain.   Yes Historical Provider, MD    Physical Exam: Filed Vitals:   04/23/13 2045 04/23/13 2100 04/23/13 2345 04/24/13 0000  BP: 151/53 140/118 170/72 182/75  Pulse: 64 67 76 64  Temp:      TempSrc:      Resp: 17 20 19 17   SpO2: 95% 93% 96% 92%     General:  Well-developed and nourished.  Eyes: Anicteric no pallor.  ENT: No discharge from ears eyes nose mouth.  Neck: No mass felt.  Cardiovascular: S1-S2 heard.  Respiratory: No rhonchi or crepitations.  Abdomen: Soft nontender bowel sounds present.  Skin: No rash.  Musculoskeletal: Pain on moving her right hip.  Psychiatric: Appears normal.  Neurologic: Alert and oriented to time place and person. Moves all extremities.  Labs on Admission:  Basic Metabolic Panel:  Recent Labs Lab 04/23/13 2016  NA 133*  K 4.3  CL 97  CO2 26  GLUCOSE 105*  BUN 17  CREATININE 0.78  CALCIUM 8.4   Liver Function Tests: No results found for this basename: AST, ALT, ALKPHOS, BILITOT, PROT, ALBUMIN,  in the last 168 hours No results found for this basename: LIPASE, AMYLASE,  in the last 168 hours No results found for this basename: AMMONIA,  in the last 168 hours CBC:  Recent Labs Lab 04/23/13 2016  WBC 4.9  NEUTROABS 3.2  HGB 11.7*  HCT 35.1*  MCV 93.4  PLT 240   Cardiac Enzymes: No results found for this basename: CKTOTAL, CKMB, CKMBINDEX, TROPONINI,  in the last 168 hours  BNP (last 3 results) No results found for this basename: PROBNP,  in the last 8760 hours CBG: No results found for this basename: GLUCAP,  in the last 168 hours  Radiological Exams on Admission: Dg Hip Complete Right  04/23/2013   CLINICAL DATA:  Fall. Right hip pain.  EXAM: RIGHT HIP - COMPLETE 2+ VIEW  COMPARISON:  08/04/2012  FINDINGS: There is a fracture of the proximal right femur. The primary  fractures across the base of the femoral neck with a secondary fracture across the greater trochanter. The major fracture fragments are displaced by 1 cm and there is varus angulation. There is an old healed fracture of the inferior right pubic ramus. An old left proximal femur fracture has been reduced with a compression screw and fixation plate. The orthopedic hardware is well-seated and aligned and the fracture well healed.  The bones are extensively demineralized.  IMPRESSION: Right proximal femur fracture with the fracture across the base of the femoral neck with a secondary fracture of the greater trochanter with mild displacement and varus  angulation.   Electronically Signed   By: Amie Portland M.D.   On: 04/23/2013 21:48   Dg Femur Right  04/23/2013   CLINICAL DATA:  Fall. Right hip pain.  EXAM: RIGHT FEMUR - 2 VIEW  COMPARISON:  08/04/2012  FINDINGS: There is a fracture of the proximal right femur. This appears to the across the base of the femoral neck. It may involve the greater trochanter which would make this an intertrochanteric fracture. The major fracture fragments are displaced by 1 cm and there is varus angulation.  No other evidence of a fracture. The knee and hip joints are normally aligned. The bones are extensively demineralized. Dense vascular calcification is noted along the superficial femoral artery.  IMPRESSION: Right proximal femur fracture. This is either across the base of the femoral neck or across the base the femoral neck to include the greater trochanter. Is mildly displaced with varus angulation.   Electronically Signed   By: Amie Portland M.D.   On: 04/23/2013 21:47     Assessment/Plan Principal Problem:   Hip fracture Active Problems:   HTN (hypertension)   Anemia   1. Right hip fracture - from medical stand point of view patient looks medically stable for surgery. Further recommendations orthopedic surgery. Patient will be placed on pain relief  medications. 2. Hypertension - continue present medications. Patient will be also on when necessary IV hydralazine for systolic blood pressure more than 160. 3. Anemia - follow CBC closely.    Code Status: Full code.  Family Communication: Patient's daughter at the bedside.  Disposition Plan: Admit to inpatient.    KAKRAKANDY,ARSHAD N. [facility] Pager [phone].  If 7PM-7AM, please contact night-coverage [email or web address] Password [email or web address] 04/24/2013, 12:26 AM

## 2013-04-24 NOTE — Care Management Note (Signed)
  Page 1 of 1   04/24/2013     9:56:45 AM   CARE MANAGEMENT NOTE 04/24/2013  Patient:  Yvette Carroll, Yvette Carroll   Account Number:  [id number]  Date Initiated:  04/24/2013  Documentation initiated by:  Ronny Flurry  Subjective/Objective Assessment:     Action/Plan:   04-24-13 intramedullary nail fixation  From [facility]   Anticipated DC Date:     Anticipated DC Plan:    In-house referral  Clinical Social Worker         Choice offered to / List presented to:             Status of service:   Medicare Important Message given?   (If response is "NO", the following Medicare IM given date fields will be blank) Date Medicare IM given:   Date Additional Medicare IM given:    Discharge Disposition:    Per UR Regulation:    If discussed at Long Length of Stay Meetings, dates discussed:    Comments:  Gaile Allmon patient's son: [phone]

## 2013-04-24 NOTE — Consult Note (Signed)
Reason for Consult: Right hip intertrochanteric fracture Referring Physician: Nikkie Liming is an 79 y.o. female.  HPI: Patient is a 79 year old woman who states she fell sustaining mechanical fall no loss of consciousness landing on the right hip.  Past Medical History  Diagnosis Date  . GERD (gastroesophageal reflux disease)   . Hypertension   . Glaucoma   . Depression   . Lumbar spinal stenosis   . Right knee DJD   . Osteoporosis   . Vertigo     Past Surgical History  Procedure Laterality Date  . Fracture surgery    . Neck surgery    . Eye surgery    . Abdominal hysterectomy    . Hip fracture surgery Left     History reviewed. No pertinent family history.  Social History:  reports that she has never smoked. She has never used smokeless tobacco. She reports that she does not drink alcohol or use illicit drugs.  Allergies:  Allergies  Allergen Reactions  . Macrolides And Ketolides Other (See Comments)    Per MAR  . Penicillins Swelling  . Betadine [Povidone Iodine] Itching and Rash    Medications: I have reviewed the patient's current medications.  Results for orders placed during the hospital encounter of 04/23/13 (from the past 48 hour(s))  BASIC METABOLIC PANEL     Status: Abnormal   Collection Time    04/23/13  8:16 PM      Result Value Range   Sodium 133 (*) 135 - 145 mEq/L   Potassium 4.3  3.5 - 5.1 mEq/L   Chloride 97  96 - 112 mEq/L   CO2 26  19 - 32 mEq/L   Glucose, Bld 105 (*) 70 - 99 mg/dL   BUN 17  6 - 23 mg/dL   Creatinine, Ser 1.61  0.50 - 1.10 mg/dL   Calcium 8.4  8.4 - 09.6 mg/dL   GFR calc non Af Amer 71 (*) >90 mL/min   GFR calc Af Amer 83 (*) >90 mL/min   Comment: (NOTE)     The eGFR has been calculated using the CKD EPI equation.     This calculation has not been validated in all clinical situations.     eGFR's persistently <90 mL/min signify possible Chronic Kidney     Disease.  CBC WITH DIFFERENTIAL     Status: Abnormal   Collection Time    04/23/13  8:16 PM      Result Value Range   WBC 4.9  4.0 - 10.5 K/uL   RBC 3.76 (*) 3.87 - 5.11 MIL/uL   Hemoglobin 11.7 (*) 12.0 - 15.0 g/dL   HCT 04.5 (*) 40.9 - 81.1 %   MCV 93.4  78.0 - 100.0 fL   MCH 31.1  26.0 - 34.0 pg   MCHC 33.3  30.0 - 36.0 g/dL   RDW 91.4  78.2 - 95.6 %   Platelets 240  150 - 400 K/uL   Neutrophils Relative % 65  43 - 77 %   Neutro Abs 3.2  1.7 - 7.7 K/uL   Lymphocytes Relative 23  12 - 46 %   Lymphs Abs 1.1  0.7 - 4.0 K/uL   Monocytes Relative 9  3 - 12 %   Monocytes Absolute 0.5  0.1 - 1.0 K/uL   Eosinophils Relative 2  0 - 5 %   Eosinophils Absolute 0.1  0.0 - 0.7 K/uL   Basophils Relative 0  0 - 1 %  Basophils Absolute 0.0  0.0 - 0.1 K/uL  PROTIME-INR     Status: None   Collection Time    04/23/13  8:16 PM      Result Value Range   Prothrombin Time 13.6  11.6 - 15.2 seconds   INR 1.06  0.00 - 1.49  TYPE AND SCREEN     Status: None   Collection Time    04/23/13 10:05 PM      Result Value Range   ABO/RH(D) O POS     Antibody Screen NEG     Sample Expiration 04/26/2013    ABO/RH     Status: None   Collection Time    04/23/13 10:05 PM      Result Value Range   ABO/RH(D) O POS    URINALYSIS, ROUTINE W REFLEX MICROSCOPIC     Status: Abnormal   Collection Time    04/23/13 11:58 PM      Result Value Range   Color, Urine YELLOW  YELLOW   APPearance CLEAR  CLEAR   Specific Gravity, Urine 1.014  1.005 - 1.030   pH 7.0  5.0 - 8.0   Glucose, UA NEGATIVE  NEGATIVE mg/dL   Hgb urine dipstick NEGATIVE  NEGATIVE   Bilirubin Urine NEGATIVE  NEGATIVE   Ketones, ur NEGATIVE  NEGATIVE mg/dL   Protein, ur NEGATIVE  NEGATIVE mg/dL   Urobilinogen, UA 0.2  0.0 - 1.0 mg/dL   Nitrite NEGATIVE  NEGATIVE   Leukocytes, UA TRACE (*) NEGATIVE  URINE MICROSCOPIC-ADD ON     Status: Abnormal   Collection Time    04/23/13 11:58 PM      Result Value Range   Squamous Epithelial / LPF RARE  RARE   WBC, UA 0-2  <3 WBC/hpf   Bacteria, UA RARE   RARE   Casts HYALINE CASTS (*) NEGATIVE  SURGICAL PCR SCREEN     Status: Abnormal   Collection Time    04/24/13  1:55 AM      Result Value Range   MRSA, PCR POSITIVE (*) NEGATIVE   Comment: RESULT CALLED TO, READ BACK BY AND VERIFIED WITH:     Jose Persia RN [id number] [phone] EBANKS COLCLOUGH, S   Staphylococcus aureus POSITIVE (*) NEGATIVE   Comment:            The Xpert SA Assay (FDA     approved for NASAL specimens     in patients over 79 years of age),     is one component of     a comprehensive surveillance     program.  Test performance has     been validated by [employer] for patients greater     than or equal to 63 year old.     It is not intended     to diagnose infection nor to     guide or monitor treatment.     RESULT CALLED TO, READ BACK BY AND VERIFIED WITH:     Jose Persia RN [phone] EBANKS COLCLOUGH, S  GLUCOSE, CAPILLARY     Status: Abnormal   Collection Time    04/24/13  2:55 AM      Result Value Range   Glucose-Capillary 149 (*) 70 - 99 mg/dL  CBC WITH DIFFERENTIAL     Status: Abnormal   Collection Time    04/24/13  5:50 AM      Result Value Range   WBC 9.8  4.0 - 10.5 K/uL  RBC 4.00  3.87 - 5.11 MIL/uL   Hemoglobin 12.5  12.0 - 15.0 g/dL   HCT 96.0  45.4 - 09.8 %   MCV 92.3  78.0 - 100.0 fL   MCH 31.3  26.0 - 34.0 pg   MCHC 33.9  30.0 - 36.0 g/dL   RDW 11.9  14.7 - 82.9 %   Platelets 239  150 - 400 K/uL   Neutrophils Relative % 84 (*) 43 - 77 %   Neutro Abs 8.2 (*) 1.7 - 7.7 K/uL   Lymphocytes Relative 10 (*) 12 - 46 %   Lymphs Abs 0.9  0.7 - 4.0 K/uL   Monocytes Relative 6  3 - 12 %   Monocytes Absolute 0.6  0.1 - 1.0 K/uL   Eosinophils Relative 0  0 - 5 %   Eosinophils Absolute 0.0  0.0 - 0.7 K/uL   Basophils Relative 0  0 - 1 %   Basophils Absolute 0.0  0.0 - 0.1 K/uL    Dg Hip Complete Right  04/23/2013   CLINICAL DATA:  Fall. Right hip pain.  EXAM: RIGHT HIP - COMPLETE 2+ VIEW  COMPARISON:  08/04/2012  FINDINGS: There is a fracture  of the proximal right femur. The primary fractures across the base of the femoral neck with a secondary fracture across the greater trochanter. The major fracture fragments are displaced by 1 cm and there is varus angulation. There is an old healed fracture of the inferior right pubic ramus. An old left proximal femur fracture has been reduced with a compression screw and fixation plate. The orthopedic hardware is well-seated and aligned and the fracture well healed.  The bones are extensively demineralized.  IMPRESSION: Right proximal femur fracture with the fracture across the base of the femoral neck with a secondary fracture of the greater trochanter with mild displacement and varus angulation.   Electronically Signed   By: Amie Portland M.D.   On: 04/23/2013 21:48   Dg Femur Right  04/23/2013   CLINICAL DATA:  Fall. Right hip pain.  EXAM: RIGHT FEMUR - 2 VIEW  COMPARISON:  08/04/2012  FINDINGS: There is a fracture of the proximal right femur. This appears to the across the base of the femoral neck. It may involve the greater trochanter which would make this an intertrochanteric fracture. The major fracture fragments are displaced by 1 cm and there is varus angulation.  No other evidence of a fracture. The knee and hip joints are normally aligned. The bones are extensively demineralized. Dense vascular calcification is noted along the superficial femoral artery.  IMPRESSION: Right proximal femur fracture. This is either across the base of the femoral neck or across the base the femoral neck to include the greater trochanter. Is mildly displaced with varus angulation.   Electronically Signed   By: Amie Portland M.D.   On: 04/23/2013 21:47    Review of Systems  All other systems reviewed and are negative.   Blood pressure 146/62, pulse 79, temperature 97.8 F (36.6 C), temperature source Oral, resp. rate 19, height 5' (1.524 m), weight 59.421 kg (131 lb), SpO2 95.00%. Physical Exam On examination  patient's right lower extremity is shortened and externally rotated. There is pain with range of motion. She is a good palpable dorsalis pedis pulse. Radiographs shows a right intertrochanteric hip fracture. Assessment/Plan: Assessment: Right intertrochanteric hip fracture.  Plan: Will plan for an intramedullary nail fixation. Risks and benefits were discussed including infection neurovascular injury persistent pain DVT pulmonary  embolus need for additional surgery. Patient states she understands and wished to proceed at this time.  DUDA,MARCUS V 04/24/2013, 6:57 AM

## 2013-04-24 NOTE — Progress Notes (Signed)
Patients  circulation, skin color and sensation checked.  Pt was able to wiggle toes, skin color was pink and warm and less and 3sec return of blood in nail.  Check every 4 hours in right fx leg.  Yvette Carroll

## 2013-04-24 NOTE — Progress Notes (Signed)
PHARMACIST - PHYSICIAN ORDER COMMUNICATION  CONCERNING: P&T Medication Policy on Herbal Medications  DESCRIPTION:  This patient's order for:  Alpha Lipoic Acid  has been noted.  This product(s) is classified as an "herbal" or natural product. Due to a lack of definitive safety studies or FDA approval, nonstandard manufacturing practices, plus the potential risk of unknown drug-drug interactions while on inpatient medications, the Pharmacy and Therapeutics Committee does not permit the use of "herbal" or natural products of this type within [place].   ACTION TAKEN: The pharmacy department is unable to verify this order at this time and your patient has been informed of this safety policy. Please reevaluate patient's clinical condition at discharge and address if the herbal or natural product(s) should be resumed at that time.   

## 2013-04-24 NOTE — Anesthesia Preprocedure Evaluation (Addendum)
Anesthesia Evaluation  Patient identified by MRN, date of birth, ID band Patient awake and Patient confused    Reviewed: Allergy & Precautions, H&P , NPO status , Patient's Chart, lab work & pertinent test results, reviewed documented beta blocker date and time   History of Anesthesia Complications (+) history of anesthetic complications  Airway Mallampati: II TM Distance: >3 FB Neck ROM: Full    Dental  (+) Teeth Intact, Poor Dentition and Dental Advisory Given   Pulmonary  Seasonal allergies breath sounds clear to auscultation        Cardiovascular hypertension, Pt. on medications and Pt. on home beta blockers Rhythm:Regular Rate:Normal     Neuro/Psych Depression negative neurological ROS     GI/Hepatic Neg liver ROS, GERD-  Medicated and Controlled,  Endo/Other  negative endocrine ROS  Renal/GU negative Renal ROS     Musculoskeletal   Abdominal   Peds  Hematology negative hematology ROS (+)   Anesthesia Other Findings Spinal stenosis  Reproductive/Obstetrics negative OB ROS                          Anesthesia Physical Anesthesia Plan  ASA: II  Anesthesia Plan: General   Post-op Pain Management:    Induction: Intravenous  Airway Management Planned: Oral ETT  Additional Equipment:   Intra-op Plan:   Post-operative Plan: Extubation in OR  Informed Consent: I have reviewed the patients History and Physical, chart, labs and discussed the procedure including the risks, benefits and alternatives for the proposed anesthesia with the patient or authorized representative who has indicated his/her understanding and acceptance.     Plan Discussed with: CRNA, Anesthesiologist and Surgeon  Anesthesia Plan Comments:         Anesthesia Quick Evaluation

## 2013-04-24 NOTE — Transfer of Care (Signed)
Immediate Anesthesia Transfer of Care Note  Patient: Yvette Carroll  Procedure(s) Performed: Procedure(s): INTRAMEDULLARY (IM) NAIL INTERTROCHANTRIC (Right)  Patient Location: PACU  Anesthesia Type:General  Level of Consciousness: awake, alert  and patient cooperative  Airway & Oxygen Therapy: Patient Spontanous Breathing and Patient connected to face mask oxygen  Post-op Assessment: Report given to PACU RN, Post -op Vital signs reviewed and stable, Patient moving all extremities and Patient moving all extremities X 4  Post vital signs: Reviewed and stable  Complications: No apparent anesthesia complications

## 2013-04-24 NOTE — Preoperative (Signed)
Beta Blockers   Reason not to administer Beta Blockers:Metoprolol hs.

## 2013-04-24 NOTE — Progress Notes (Signed)
Orthopedic Tech Progress Note Patient Details:  [name] [birth year] [id number]  Patient ID: Yvette Carroll, female   DOB: 1919/10/16, 79 y.o.   MRN: [id number] Trapeze bar patient helper  Nikki Dom 04/24/2013, 7:04 PM

## 2013-04-24 NOTE — Op Note (Signed)
OPERATIVE REPORT  DATE OF SURGERY: 04/24/2013  PATIENT:  Yvette Carroll,  79 y.o. female  PRE-OPERATIVE DIAGNOSIS:  right troch fracture  POST-OPERATIVE DIAGNOSIS:  right troch fracture  PROCEDURE:  Procedure(s): INTRAMEDULLARY (IM) NAIL INTERTROCHANTRIC Biomet nail. 11 x 180 mm 135 nail  SURGEON:  Surgeon(s): Nadara Mustard, MD  ANESTHESIA:   general  EBL:  Minimal ML  SPECIMEN:  No Specimen  TOURNIQUET:  * No tourniquets in log *  PROCEDURE DETAILS: Patient is a 79 year old woman who had a mechanical fall sustained a intertrochanteric hip fracture on the right. Patient was evaluated medically felt to be safe for surgical intervention. Patient is status post a intertrochanteric fracture on the left previously as well. Risks and benefits were discussed with the patient and the family including infection neurovascular injury nonhealing of the wound nonhealing of the bone potential for her not to ambulate again. Patient family state understand and wish to proceed at this time. Description of procedure patient brought to the operating room and underwent a general anesthetic. After adequate levels and anesthesia obtained patient's right lower extremity was prepped using DuraPrep draped in a sterile field with the patient on the [facility] fracture table with the traction and the right dorsal lithotomy on the left. ChloraPrep was used to prepped out the hip any clear impervious drape was applied and then the Ioban shower curtain was applied. This was done to keep the Ioban off her skin. Incision was made proximal greater trochanter guidewire was inserted down the shaft this was overdrilled and the nail was placed. Use of C-arm fluoroscopy guidewire was used to insert into the center center aspect of the femoral head this measured 100 mm. 100 mm nail was inserted followed by a locking distal 34 mm screw. C-arm fluoroscopy verified reduction in both AP and lateral planes. The wounds were irrigated  with normal saline subcutaneous is closed using 2-0 Vicryl the skin was closed using staples. The wound was covered with a Mepilex dressing. Patient was extubated taken to the PACU in stable condition.  PLAN OF CARE: Admit to inpatient   PATIENT DISPOSITION:  PACU - hemodynamically stable.   Nadara Mustard, MD 04/24/2013 4:11 PM

## 2013-04-25 DIAGNOSIS — M79609 Pain in unspecified limb: Secondary | ICD-10-CM

## 2013-04-25 DIAGNOSIS — E871 Hypo-osmolality and hyponatremia: Secondary | ICD-10-CM

## 2013-04-25 LAB — CBC
HCT: 26.4 % — ABNORMAL LOW (ref 36.0–46.0)
Hemoglobin: 9 g/dL — ABNORMAL LOW (ref 12.0–15.0)
MCH: 31.3 pg (ref 26.0–34.0)
MCHC: 34.1 g/dL (ref 30.0–36.0)
MCV: 91.7 fL (ref 78.0–100.0)
RBC: 2.88 MIL/uL — ABNORMAL LOW (ref 3.87–5.11)

## 2013-04-25 LAB — GLUCOSE, CAPILLARY
Glucose-Capillary: 106 mg/dL — ABNORMAL HIGH (ref 70–99)
Glucose-Capillary: 125 mg/dL — ABNORMAL HIGH (ref 70–99)
Glucose-Capillary: 147 mg/dL — ABNORMAL HIGH (ref 70–99)

## 2013-04-25 LAB — BASIC METABOLIC PANEL
BUN: 13 mg/dL (ref 6–23)
Calcium: 7.8 mg/dL — ABNORMAL LOW (ref 8.4–10.5)
Creatinine, Ser: 0.66 mg/dL (ref 0.50–1.10)
GFR calc non Af Amer: 75 mL/min — ABNORMAL LOW (ref >90)
Glucose, Bld: 127 mg/dL — ABNORMAL HIGH (ref 70–99)

## 2013-04-25 MED ORDER — HYDROMORPHONE HCL PF 1 MG/ML IJ SOLN
0.5000 mg | INTRAMUSCULAR | Status: DC | PRN
  Administered 2013-04-25 – 2013-04-27 (×5): 0.5 mg via INTRAVENOUS
  Filled 2013-04-25 (×6): qty 1

## 2013-04-25 NOTE — Progress Notes (Signed)
Subjective: 1 Day Post-Op Procedure(s) (LRB): INTRAMEDULLARY (IM) NAIL INTERTROCHANTRIC (Right) Patient reports pain as mild.    Objective: Vital signs in last 24 hours: Temp:  [97.1 F (36.2 C)-98.3 F (36.8 C)] 98.3 F (36.8 C) (10/18 0651) Pulse Rate:  [71-88] 88 (10/18 0952) Resp:  [12-25] 18 (10/18 0651) BP: (132-156)/(48-72) 132/61 mmHg (10/18 0952) SpO2:  [93 %-100 %] 100 % (10/18 0651) Weight:  [59.24 kg (130 lb 9.6 oz)] 59.24 kg (130 lb 9.6 oz) (10/18 0651)  Intake/Output from previous day: 10/17 0701 - 10/18 0700 In: 800 [I.V.:800] Out: 1375 [Urine:1375] Intake/Output this shift:     Recent Labs  04/23/13 2016 04/24/13 0550 04/25/13 0518  HGB 11.7* 12.5 9.0*    Recent Labs  04/24/13 0550 04/25/13 0518  WBC 9.8 8.1  RBC 4.00 2.88*  HCT 36.9 26.4*  PLT 239 182    Recent Labs  04/24/13 0550 04/25/13 0518  NA 131* 131*  K 4.1 3.9  CL 93* 95*  CO2 25 26  BUN 13 13  CREATININE 0.55 0.66  GLUCOSE 147* 127*  CALCIUM 8.7 7.8*    Recent Labs  04/23/13 2016  INR 1.06    Neurologically intact leg length equal  Assessment/Plan: 1 Day Post-Op Procedure(s) (LRB): INTRAMEDULLARY (IM) NAIL INTERTROCHANTRIC (Right) Up with therapy D/c foley  Yvette Carroll C 04/25/2013, 11:21 AM

## 2013-04-25 NOTE — Progress Notes (Addendum)
[facility] PROGRESS NOTE  REATHEL TURI ZOX:[id number] DOB: [birth year] DOA: 04/23/2013 PCP: Pearson Grippe, MD  Assessment/Plan: Right hip fracture - s/p IM -pain relief medications- increase  Hypertension - continue present medications. Patient will be also on when necessary IV hydralazine for systolic blood pressure more than 160.   Anemia - follow CBC closely- prob ABLA  Hyponatremia -monitor   Code Status: full Family Communication: patient Disposition Plan: rehab- await PT/OT   Consultants:  ortho  Procedures:  IM  Antibiotics:    HPI/Subjective: Pain in hip not controlled  Objective: Filed Vitals:   04/25/13 0952  BP: 132/61  Pulse: 88  Temp:   Resp:     Intake/Output Summary (Last 24 hours) at 04/25/13 1011 Last data filed at 04/25/13 0652  Gross per 24 hour  Intake    800 ml  Output   1375 ml  Net   -575 ml   Filed Weights   04/24/13 0300 04/25/13 0651  Weight: 59.421 kg (131 lb) 59.24 kg (130 lb 9.6 oz)    Exam:   General:  A+Ox3, NAD  Cardiovascular: rrr  Respiratory: clear anterior  Abdomen: +BS, soft  Musculoskeletal: wiggles toes, no focal weakness   Data Reviewed: Basic Metabolic Panel:  Recent Labs Lab 04/23/13 2016 04/24/13 0550 04/25/13 0518  NA 133* 131* 131*  K 4.3 4.1 3.9  CL 97 93* 95*  CO2 26 25 26   GLUCOSE 105* 147* 127*  BUN 17 13 13   CREATININE 0.78 0.55 0.66  CALCIUM 8.4 8.7 7.8*   Liver Function Tests:  Recent Labs Lab 04/24/13 0550  AST 22  ALT 20  ALKPHOS 145*  BILITOT 0.3  PROT 6.8  ALBUMIN 3.5   No results found for this basename: LIPASE, AMYLASE,  in the last 168 hours No results found for this basename: AMMONIA,  in the last 168 hours CBC:  Recent Labs Lab 04/23/13 2016 04/24/13 0550 04/25/13 0518  WBC 4.9 9.8 8.1  NEUTROABS 3.2 8.2*  --   HGB 11.7* 12.5 9.0*  HCT 35.1* 36.9 26.4*  MCV 93.4 92.3 91.7  PLT 240 239 182   Cardiac Enzymes: No results found for this  basename: CKTOTAL, CKMB, CKMBINDEX, TROPONINI,  in the last 168 hours BNP (last 3 results) No results found for this basename: PROBNP,  in the last 8760 hours CBG:  Recent Labs Lab 04/24/13 1738 04/24/13 2014 04/25/13 0002 04/25/13 0402 04/25/13 0839  GLUCAP 119* 141* 145* 127* 118*    Recent Results (from the past 240 hour(s))  SURGICAL PCR SCREEN     Status: Abnormal   Collection Time    04/24/13  1:55 AM      Result Value Range Status   MRSA, PCR POSITIVE (*) NEGATIVE Final   Comment: RESULT CALLED TO, READ BACK BY AND VERIFIED WITH:     Jose Persia RN [id number] [phone] EBANKS COLCLOUGH, S   Staphylococcus aureus POSITIVE (*) NEGATIVE Final   Comment:            The Xpert SA Assay (FDA     approved for NASAL specimens     in patients over 55 years of age),     is one component of     a comprehensive surveillance     program.  Test performance has     been validated by [employer] for patients greater     than or equal to 68 year old.     It is  not intended     to diagnose infection nor to     guide or monitor treatment.     RESULT CALLED TO, READ BACK BY AND VERIFIED WITH:     Jose Persia RN [id number] [phone] EBANKS COLCLOUGH, S     Studies: Dg Hip Complete Right  04/23/2013   CLINICAL DATA:  Fall. Right hip pain.  EXAM: RIGHT HIP - COMPLETE 2+ VIEW  COMPARISON:  08/04/2012  FINDINGS: There is a fracture of the proximal right femur. The primary fractures across the base of the femoral neck with a secondary fracture across the greater trochanter. The major fracture fragments are displaced by 1 cm and there is varus angulation. There is an old healed fracture of the inferior right pubic ramus. An old left proximal femur fracture has been reduced with a compression screw and fixation plate. The orthopedic hardware is well-seated and aligned and the fracture well healed.  The bones are extensively demineralized.  IMPRESSION: Right proximal femur fracture with the fracture across  the base of the femoral neck with a secondary fracture of the greater trochanter with mild displacement and varus angulation.   Electronically Signed   By: Amie Portland M.D.   On: 04/23/2013 21:48   Dg Hip Operative Right  04/24/2013   *RADIOLOGY REPORT*  Clinical Data: IM nail of the right hip, subsequent encounter.  DG OPERATIVE RIGHT HIP  Comparison: Right hip radiographs - 04/23/2013  Findings:  Two spot fluoroscopic images of the right hip are provided for review.  Images demonstrate the sequela of intramedullary rod fixation of the proximal femur which is traversed distally with a cancellous screw.  There is dynamic screw fixation of the right femoral neck. Alignment now appears near anatomic.  No radiopaque foreign body.  IMPRESSION: Post ORIF of the right femoral neck fracture without evidence of complication.   Original Report Authenticated By: Tacey Ruiz, MD   Dg Femur Right  04/23/2013   CLINICAL DATA:  Fall. Right hip pain.  EXAM: RIGHT FEMUR - 2 VIEW  COMPARISON:  08/04/2012  FINDINGS: There is a fracture of the proximal right femur. This appears to the across the base of the femoral neck. It may involve the greater trochanter which would make this an intertrochanteric fracture. The major fracture fragments are displaced by 1 cm and there is varus angulation.  No other evidence of a fracture. The knee and hip joints are normally aligned. The bones are extensively demineralized. Dense vascular calcification is noted along the superficial femoral artery.  IMPRESSION: Right proximal femur fracture. This is either across the base of the femoral neck or across the base the femoral neck to include the greater trochanter. Is mildly displaced with varus angulation.   Electronically Signed   By: Amie Portland M.D.   On: 04/23/2013 21:47    Scheduled Meds: . amLODipine  5 mg Oral Daily  . aspirin EC  325 mg Oral Q breakfast  . Chlorhexidine Gluconate Cloth  6 each Topical Q0600  . DULoxetine  60  mg Oral Daily  . ferrous sulfate  325 mg Oral Q breakfast  . latanoprost  1 drop Both Eyes QHS  . loratadine  10 mg Oral Daily  . loteprednol  1 drop Both Eyes BID  . metoprolol succinate  25 mg Oral Daily  . multivitamin-lutein  1 capsule Oral Daily  . mupirocin ointment  1 application Nasal BID  . pantoprazole  40 mg Oral Daily  . polyethylene glycol  17 g Oral Daily  . senna  2 tablet Oral BID  . timolol  1 drop Both Eyes BID   Continuous Infusions: . sodium chloride 10 mL/hr at 04/24/13 0206  . sodium chloride 20 mL/hr (04/24/13 2217)  . lactated ringers      Principal Problem:   Hip fracture Active Problems:   HTN (hypertension)   Anemia    Time spent: 35    [facility], JESSICA  [facility] Pager [phone]. If 7PM-7AM, please contact night-coverage at [email or web address], password [facility] 04/25/2013, 10:11 AM  LOS: 2 days

## 2013-04-26 ENCOUNTER — Inpatient Hospital Stay (HOSPITAL_COMMUNITY): Payer: Medicare Other

## 2013-04-26 LAB — BASIC METABOLIC PANEL
BUN: 13 mg/dL (ref 6–23)
CO2: 28 mEq/L (ref 19–32)
Calcium: 8.1 mg/dL — ABNORMAL LOW (ref 8.4–10.5)
Chloride: 96 mEq/L (ref 96–112)
Potassium: 3.9 mEq/L (ref 3.5–5.1)
Sodium: 132 mEq/L — ABNORMAL LOW (ref 135–145)

## 2013-04-26 LAB — GLUCOSE, CAPILLARY
Glucose-Capillary: 120 mg/dL — ABNORMAL HIGH (ref 70–99)
Glucose-Capillary: 133 mg/dL — ABNORMAL HIGH (ref 70–99)

## 2013-04-26 LAB — CBC
Platelets: 160 10*3/uL (ref 150–400)
RBC: 2.46 MIL/uL — ABNORMAL LOW (ref 3.87–5.11)
WBC: 6.4 10*3/uL (ref 4.0–10.5)

## 2013-04-26 MED ORDER — GUAIFENESIN 100 MG/5ML PO SYRP
200.0000 mg | ORAL_SOLUTION | ORAL | Status: DC | PRN
  Administered 2013-04-26: 200 mg via ORAL
  Filled 2013-04-26 (×2): qty 10

## 2013-04-26 NOTE — Progress Notes (Addendum)
Physical Therapy Evaluation Late note (eval completed 04/25/13 at 13:08)  04/25/13 1308  PT Visit Information  Last PT Received On 04/25/13  Assistance Needed +2  Precautions  Precautions Fall  Precaution Comments pt reports multiple recent falls; pt with vertigo  Restrictions  Weight Bearing Restrictions Yes  RLE Weight Bearing PWB  RLE Partial Weight Bearing Percentage or Pounds 50  Home Living  Family/patient expects to be discharged to: Skilled nursing facility  Prior Function  Level of Independence Needs assistance  Gait / Transfers Assistance Needed pt reports she was independent with rollator going to/from bathroom and dining area   ADL's / Homemaking Assistance Needed pt reports she required "some help" with taking a bath   Communication  Communication No difficulties  Cognition  Arousal/Alertness Awake/alert  Behavior During Therapy WFL for tasks assessed/performed  Overall Cognitive Status Within Functional Limits for tasks assessed  Upper Extremity Assessment  Upper Extremity Assessment Defer to OT evaluation  Lower Extremity Assessment  Lower Extremity Assessment RLE deficits/detail  RLE Unable to fully assess due to pain  RLE Sensation (WFL to light touch)  Cervical / Trunk Assessment  Cervical / Trunk Assessment Kyphotic  Bed Mobility  Bed Mobility Supine to Sit;Sitting - Scoot to Edge of Bed  Supine to Sit 1: +2 Total assist;HOB elevated;With rails  Supine to Sit: Patient Percentage 40%  Sitting - Scoot to Edge of Bed 2: Max assist  Details for Bed Mobility Assistance pt able to initiate advancement of bil LEs towards EOB and demo good ability to engage core and bring trunk to EOB with 2+ (A) and use of draw pad; required incr time due to pain; cues for hand placement and sequencing   Transfers  Transfers Sit to Stand;Stand to Sit;Stand Pivot Transfers  Sit to Stand 1: +2 Total assist;From bed;From chair/3-in-1;With armrests;From elevated surface;With upper  extremity assist  Sit to Stand: Patient Percentage 50%  Stand to Sit 1: +2 Total assist;To chair/3-in-1;With armrests;With upper extremity assist  Stand to Sit: Patient Percentage 50%  Stand Pivot Transfers 1: +2 Total assist;From elevated surface;With armrests  Stand Pivot Transfers: Patient Percentage 30%  Details for Transfer Assistance pt required max cues and 2+ (A) to perform SPT bed  to 3 in 1 to chair; pt with incr difficulty powering up with PWB status on Rt LE; required max cues to maintain PWB but pt seemed to be fully WB throughout transfer    Ambulation/Gait  Ambulation/Gait Assistance Not tested (comment)  Stairs No  Wheelchair Mobility  Wheelchair Mobility No  Balance  Balance Assessed Yes  Static Sitting Balance  Static Sitting - Balance Support Bilateral upper extremity supported;Feet unsupported  Static Sitting - Level of Assistance 5: Stand by assistance  Static Sitting - Comment/# of Minutes pt with lateral lean to Lt due to pain; cues to find midline and pt was able to eventually come to upright midline position at EOB; tolerated sitting EOB ~4 min  Static Standing Balance  Static Standing - Balance Support Bilateral upper extremity supported;During functional activity  Static Standing - Level of Assistance 1: +2 Total assist  Static Standing - Comment/# of Minutes pt required 2+ (A) to be cleaned on [facility] after having BM  Exercises  Exercises General Lower Extremity  General Exercises - Lower Extremity  Ankle Circles/Pumps AROM;10 reps;Supine;Both  PT - End of Session  Equipment Utilized During Treatment Gait belt;Oxygen  Activity Tolerance Patient tolerated treatment well  Patient left in chair;with call bell/phone within reach  Nurse Communication Mobility status  PT Assessment  PT Recommendation/Assessment Patient needs continued PT services  PT Problem List Decreased strength;Decreased range of motion;Decreased activity tolerance;Decreased balance;Decreased  mobility;Decreased knowledge of use of DME;Decreased safety awareness;Pain  PT Therapy Diagnosis  Difficulty walking;Acute pain  PT Plan  PT Frequency Min 3X/week  PT Treatment/Interventions DME instruction;Gait training;Functional mobility training;Therapeutic activities;Therapeutic exercise;Balance training;Neuromuscular re-education;Patient/family education  PT Recommendation  Follow Up Recommendations SNF;Supervision/Assistance - 24 hour  PT equipment Other (comment) (TBD)  Individuals Consulted  Consulted and Agree with Results and Recommendations Patient  Acute Rehab PT Goals  Patient Stated Goal to get up and eat lunch  PT Goal Formulation With patient  Time For Goal Achievement 05/02/13  Potential to Achieve Goals Good  PT Time Calculation  PT Start Time 1252  PT Stop Time 1319  PT Time Calculation (min) 27 min  PT General Charges  $$ ACUTE PT VISIT 1 Procedure  PT Evaluation  $Initial PT Evaluation Tier I 1 Procedure  PT Treatments  $Therapeutic Activity 23-37 mins  Durenda Hurt. Earlene Plater PT, [facility] Acute Rehab Services Pager [phone] [place], [place]

## 2013-04-26 NOTE — Progress Notes (Signed)
Subjective: 2 Days Post-Op Procedure(s) (LRB): INTRAMEDULLARY (IM) NAIL INTERTROCHANTRIC (Right) Patient reports pain as moderate.    Objective: Vital signs in last 24 hours: Temp:  [97.6 F (36.4 C)-97.7 F (36.5 C)] 97.7 F (36.5 C) (10/18 2100) Pulse Rate:  [78-90] 90 (10/18 2100) Resp:  [18] 18 (10/18 2100) BP: (94-126)/(49-69) 94/69 mmHg (10/18 2100) SpO2:  [99 %-100 %] 100 % (10/18 2100)  Intake/Output from previous day:   Intake/Output this shift:     Recent Labs  04/23/13 2016 04/24/13 0550 04/25/13 0518 04/26/13 0600  HGB 11.7* 12.5 9.0* 7.6*    Recent Labs  04/25/13 0518 04/26/13 0600  WBC 8.1 6.4  RBC 2.88* 2.46*  HCT 26.4* 22.7*  PLT 182 160    Recent Labs  04/25/13 0518 04/26/13 0600  NA 131* 132*  K 3.9 3.9  CL 95* 96  CO2 26 28  BUN 13 13  CREATININE 0.66 0.69  GLUCOSE 127* 114*  CALCIUM 7.8* 8.1*    Recent Labs  04/23/13 2016  INR 1.06    Neurologically intact  Assessment/Plan: 2 Days Post-Op Procedure(s) (LRB): INTRAMEDULLARY (IM) NAIL INTERTROCHANTRIC (Right) Up with therapy  OOB to recliner  YATES,MARK C 04/26/2013, 12:03 PM

## 2013-04-26 NOTE — Progress Notes (Signed)
[facility] PROGRESS NOTE  BERTINA GUTHRIDGE AVW:[id number] DOB: 1920/01/11 DOA: 04/23/2013 PCP: Pearson Grippe, MD  Assessment/Plan: Right hip fracture - s/p IM -pain better controlled  Hypertension - continue present medications. Patient will be also on when necessary IV hydralazine for systolic blood pressure more than 160.   Anemia - follow CBC closely- prob ABLA- transfuse if < 7  Hyponatremia -monitor   Code Status: full Family Communication: patient Disposition Plan: rehab- await PT/OT   Consultants:  ortho  Procedures:  IM  Antibiotics:    HPI/Subjective: Eating breakfast, feeling better  Objective: Filed Vitals:   04/25/13 2100  BP: 94/69  Pulse: 90  Temp: 97.7 F (36.5 C)  Resp: 18    Intake/Output Summary (Last 24 hours) at 04/26/13 0810 Last data filed at 04/25/13 1328  Gross per 24 hour  Intake      0 ml  Output      0 ml  Net      0 ml   Filed Weights   04/24/13 0300 04/25/13 0651  Weight: 59.421 kg (131 lb) 59.24 kg (130 lb 9.6 oz)    Exam:   General:  A+Ox3, NAD  Cardiovascular: rrr  Respiratory: clear anterior  Abdomen: +BS, soft  Musculoskeletal: wiggles toes, no focal weakness   Data Reviewed: Basic Metabolic Panel:  Recent Labs Lab 04/23/13 2016 04/24/13 0550 04/25/13 0518 04/26/13 0600  NA 133* 131* 131* 132*  K 4.3 4.1 3.9 3.9  CL 97 93* 95* 96  CO2 26 25 26 28   GLUCOSE 105* 147* 127* 114*  BUN 17 13 13 13   CREATININE 0.78 0.55 0.66 0.69  CALCIUM 8.4 8.7 7.8* 8.1*   Liver Function Tests:  Recent Labs Lab 04/24/13 0550  AST 22  ALT 20  ALKPHOS 145*  BILITOT 0.3  PROT 6.8  ALBUMIN 3.5   No results found for this basename: LIPASE, AMYLASE,  in the last 168 hours No results found for this basename: AMMONIA,  in the last 168 hours CBC:  Recent Labs Lab 04/23/13 2016 04/24/13 0550 04/25/13 0518 04/26/13 0600  WBC 4.9 9.8 8.1 6.4  NEUTROABS 3.2 8.2*  --   --   HGB 11.7* 12.5 9.0* 7.6*  HCT  35.1* 36.9 26.4* 22.7*  MCV 93.4 92.3 91.7 92.3  PLT 240 239 182 160   Cardiac Enzymes: No results found for this basename: CKTOTAL, CKMB, CKMBINDEX, TROPONINI,  in the last 168 hours BNP (last 3 results) No results found for this basename: PROBNP,  in the last 8760 hours CBG:  Recent Labs Lab 04/25/13 1223 04/25/13 1632 04/25/13 2009 04/26/13 0003 04/26/13 0352  GLUCAP 106* 125* 147* 120* 133*    Recent Results (from the past 240 hour(s))  SURGICAL PCR SCREEN     Status: Abnormal   Collection Time    04/24/13  1:55 AM      Result Value Range Status   MRSA, PCR POSITIVE (*) NEGATIVE Final   Comment: RESULT CALLED TO, READ BACK BY AND VERIFIED WITH:     Jose Persia RN [id number] [phone] EBANKS COLCLOUGH, S   Staphylococcus aureus POSITIVE (*) NEGATIVE Final   Comment:            The Xpert SA Assay (FDA     approved for NASAL specimens     in patients over 77 years of age),     is one component of     a comprehensive surveillance     program.  Test performance has  been validated by [facility] for patients greater     than or equal to 21 year old.     It is not intended     to diagnose infection nor to     guide or monitor treatment.     RESULT CALLED TO, READ BACK BY AND VERIFIED WITH:     Jose Persia RN [id number] [phone] EBANKS COLCLOUGH, S     Studies: Dg Hip Operative Right  05/16/13   *RADIOLOGY REPORT*  Clinical Data: IM nail of the right hip, subsequent encounter.  DG OPERATIVE RIGHT HIP  Comparison: Right hip radiographs - 04/23/2013  Findings:  Two spot fluoroscopic images of the right hip are provided for review.  Images demonstrate the sequela of intramedullary rod fixation of the proximal femur which is traversed distally with a cancellous screw.  There is dynamic screw fixation of the right femoral neck. Alignment now appears near anatomic.  No radiopaque foreign body.  IMPRESSION: Post ORIF of the right femoral neck fracture without evidence of complication.    Original Report Authenticated By: Tacey Ruiz, MD    Scheduled Meds: . amLODipine  5 mg Oral Daily  . aspirin EC  325 mg Oral Q breakfast  . Chlorhexidine Gluconate Cloth  6 each Topical Q0600  . DULoxetine  60 mg Oral Daily  . ferrous sulfate  325 mg Oral Q breakfast  . latanoprost  1 drop Both Eyes QHS  . loratadine  10 mg Oral Daily  . loteprednol  1 drop Both Eyes BID  . metoprolol succinate  25 mg Oral Daily  . multivitamin-lutein  1 capsule Oral Daily  . mupirocin ointment  1 application Nasal BID  . pantoprazole  40 mg Oral Daily  . polyethylene glycol  17 g Oral Daily  . senna  2 tablet Oral BID  . timolol  1 drop Both Eyes BID   Continuous Infusions: . sodium chloride 10 mL/hr at 16-May-2013 0206  . sodium chloride 20 mL/hr (May 16, 2013 2217)  . lactated ringers      Principal Problem:   Hip fracture Active Problems:   HTN (hypertension)   Anemia    Time spent: 25    [facility], Jahkeem Kurka  [facility] Pager [phone]. If 7PM-7AM, please contact night-coverage at [email or web address], password [facility] 04/26/2013, 8:10 AM  LOS: 3 days

## 2013-04-27 ENCOUNTER — Encounter (HOSPITAL_COMMUNITY): Payer: Self-pay | Admitting: Orthopedic Surgery

## 2013-04-27 LAB — BASIC METABOLIC PANEL
BUN: 13 mg/dL (ref 6–23)
CO2: 27 mEq/L (ref 19–32)
Calcium: 8.1 mg/dL — ABNORMAL LOW (ref 8.4–10.5)
Creatinine, Ser: 0.68 mg/dL (ref 0.50–1.10)
GFR calc Af Amer: 87 mL/min — ABNORMAL LOW (ref >90)
Glucose, Bld: 105 mg/dL — ABNORMAL HIGH (ref 70–99)
Potassium: 4 mEq/L (ref 3.5–5.1)

## 2013-04-27 LAB — CBC
HCT: 22.6 % — ABNORMAL LOW (ref 36.0–46.0)
Hemoglobin: 7.6 g/dL — ABNORMAL LOW (ref 12.0–15.0)
MCH: 31 pg (ref 26.0–34.0)
MCV: 92.2 fL (ref 78.0–100.0)
RBC: 2.45 MIL/uL — ABNORMAL LOW (ref 3.87–5.11)

## 2013-04-27 MED ORDER — TROLAMINE SALICYLATE 10 % EX CREA
TOPICAL_CREAM | CUTANEOUS | Status: DC | PRN
  Filled 2013-04-27 (×2): qty 85

## 2013-04-27 MED ORDER — MUSCLE RUB 10-15 % EX CREA
TOPICAL_CREAM | CUTANEOUS | Status: DC | PRN
  Filled 2013-04-27: qty 85

## 2013-04-27 MED ORDER — METOPROLOL SUCCINATE ER 25 MG PO TB24
25.0000 mg | ORAL_TABLET | Freq: Every day | ORAL | Status: DC
  Administered 2013-04-28: 25 mg via ORAL
  Filled 2013-04-27: qty 1

## 2013-04-27 NOTE — Progress Notes (Signed)
Physical Therapy Treatment Patient Details Name: Yvette Carroll MRN: [id number] DOB: 1919/09/08 Today's Date: 04/27/2013 Time: 4098-1191 PT Time Calculation (min): 31 min  PT Assessment / Plan / Recommendation  History of Present Illness pt presents post fall resulting in R Intertrochanteric fx s/p IM nail.     PT Comments   Pt with mild confusion today and spoke with daughter on phone, who notes this is not baseline level of cognition.  Pt unable to maintain PWBing on R LE.  Will continue to follow.    Follow Up Recommendations  SNF     Does the patient have the potential to tolerate intense rehabilitation     Barriers to Discharge        Equipment Recommendations  Rolling walker with 5" wheels    Recommendations for Other Services    Frequency Min 3X/week   Progress towards PT Goals Progress towards PT goals: Progressing toward goals  Plan Current plan remains appropriate    Precautions / Restrictions Precautions Precautions: Fall Precaution Comments: pt reports multiple recent falls; pt with vertigo Restrictions Weight Bearing Restrictions: Yes RLE Weight Bearing: Partial weight bearing RLE Partial Weight Bearing Percentage or Pounds: 50   Pertinent Vitals/Pain Indicates R hip pain, though does not rate.  Premedicated prior to PT.      Mobility  Bed Mobility Bed Mobility: Rolling Left;Left Sidelying to Sit;Sitting - Scoot to Edge of Bed Rolling Left: 4: Min assist Left Sidelying to Sit: 3: Mod assist Sitting - Scoot to Edge of Bed: 2: Max assist Details for Bed Mobility Assistance: pt moves slowly, but is able to participate with bed mobility.   Transfers Transfers: Sit to Stand;Stand to Sit;Stand Pivot Transfers Sit to Stand: 1: +2 Total assist;With upper extremity assist;From bed;From chair/3-in-1 Sit to Stand: Patient Percentage: 50% Stand to Sit: 1: +2 Total assist;With upper extremity assist;To chair/3-in-1 Stand to Sit: Patient Percentage: 50% Stand Pivot  Transfers: 1: +2 Total assist Stand Pivot Transfers: Patient Percentage: 50% Details for Transfer Assistance: Max cueing for safe technique and to maintain PWBing, however pt unable to decrease WBing on R LE.   Ambulation/Gait Ambulation/Gait Assistance: Not tested (comment) Stairs: No Wheelchair Mobility Wheelchair Mobility: No    Exercises     PT Diagnosis:    PT Problem List:   PT Treatment Interventions:     PT Goals (current goals can now be found in the care plan section) Acute Rehab PT Goals Time For Goal Achievement: 05/02/13 Potential to Achieve Goals: Good  Visit Information  Last PT Received On: 04/27/13 Assistance Needed: +2 History of Present Illness: pt presents post fall resulting in R Intertrochanteric fx s/p IM nail.      Subjective Data      Cognition  Cognition Arousal/Alertness: Awake/alert Behavior During Therapy: WFL for tasks assessed/performed Overall Cognitive Status: Impaired/Different from baseline Area of Impairment: Attention;Memory;Safety/judgement;Problem solving Current Attention Level: Selective Memory: Decreased short-term memory;Decreased recall of precautions Safety/Judgement: Decreased awareness of safety;Decreased awareness of deficits Problem Solving: Slow processing;Difficulty sequencing    Balance  Balance Balance Assessed: Yes Static Standing Balance Static Standing - Balance Support: Bilateral upper extremity supported;During functional activity Static Standing - Level of Assistance: 3: Mod assist Static Standing - Comment/# of Minutes: pt able to stand with RW during peri hyiene after having BM.  pt again not following directions for PWBing.    End of Session PT - End of Session Equipment Utilized During Treatment: Gait belt Activity Tolerance: Patient tolerated treatment well Patient left:  in chair;with call bell/phone within reach;with chair alarm set Nurse Communication: Mobility status   GP     RitenourAlison Murray,  [place] [phone] 04/27/2013, 10:36 AM

## 2013-04-27 NOTE — Progress Notes (Signed)
[facility] PROGRESS NOTE  Yvette Carroll JXB:[id number] DOB: 1919-10-21 DOA: 04/23/2013 PCP: Pearson Grippe, MD  Assessment/Plan: Right hip fracture - s/p IM -pain better controlled  Hypertension - continue present medications. With holding paramters.   Anemia - follow CBC closely- prob ABLA- will transfuse 1 unit of PRBC's as patient's BP is low and she feels fatigued  Hyponatremia -monitor   Code Status: full Family Communication: patient Disposition Plan: SNF   Consultants:  ortho  Procedures:  IM  Antibiotics:    HPI/Subjective: Up on side of bed fatigued  Objective: Filed Vitals:   04/27/13 0402  BP: 96/47  Pulse: 85  Temp: 98.6 F (37 C)  Resp: 18    Intake/Output Summary (Last 24 hours) at 04/27/13 0834 Last data filed at 04/26/13 1300  Gross per 24 hour  Intake    240 ml  Output      0 ml  Net    240 ml   Filed Weights   04/24/13 0300 04/25/13 0651  Weight: 59.421 kg (131 lb) 59.24 kg (130 lb 9.6 oz)    Exam:   General:  A+Ox3, NAD  Cardiovascular: rrr  Respiratory: clear anterior  Abdomen: +BS, soft  Musculoskeletal: wiggles toes, no focal weakness   Data Reviewed: Basic Metabolic Panel:  Recent Labs Lab 04/23/13 2016 04/24/13 0550 04/25/13 0518 04/26/13 0600 04/27/13 0600  NA 133* 131* 131* 132* 133*  K 4.3 4.1 3.9 3.9 4.0  CL 97 93* 95* 96 98  CO2 26 25 26 28 27   GLUCOSE 105* 147* 127* 114* 105*  BUN 17 13 13 13 13   CREATININE 0.78 0.55 0.66 0.69 0.68  CALCIUM 8.4 8.7 7.8* 8.1* 8.1*   Liver Function Tests:  Recent Labs Lab 04/24/13 0550  AST 22  ALT 20  ALKPHOS 145*  BILITOT 0.3  PROT 6.8  ALBUMIN 3.5   No results found for this basename: LIPASE, AMYLASE,  in the last 168 hours No results found for this basename: AMMONIA,  in the last 168 hours CBC:  Recent Labs Lab 04/23/13 2016 04/24/13 0550 04/25/13 0518 04/26/13 0600 04/27/13 0600  WBC 4.9 9.8 8.1 6.4 6.3  NEUTROABS 3.2 8.2*  --   --    --   HGB 11.7* 12.5 9.0* 7.6* 7.6*  HCT 35.1* 36.9 26.4* 22.7* 22.6*  MCV 93.4 92.3 91.7 92.3 92.2  PLT 240 239 182 160 181   Cardiac Enzymes: No results found for this basename: CKTOTAL, CKMB, CKMBINDEX, TROPONINI,  in the last 168 hours BNP (last 3 results) No results found for this basename: PROBNP,  in the last 8760 hours CBG:  Recent Labs Lab 04/25/13 2009 04/26/13 0003 04/26/13 0352 04/26/13 0827 04/26/13 1239  GLUCAP 147* 120* 133* 135* 108*    Recent Results (from the past 240 hour(s))  SURGICAL PCR SCREEN     Status: Abnormal   Collection Time    04/24/13  1:55 AM      Result Value Range Status   MRSA, PCR POSITIVE (*) NEGATIVE Final   Comment: RESULT CALLED TO, READ BACK BY AND VERIFIED WITH:     Jose Persia RN [id number] [phone] EBANKS COLCLOUGH, S   Staphylococcus aureus POSITIVE (*) NEGATIVE Final   Comment:            The Xpert SA Assay (FDA     approved for NASAL specimens     in patients over 66 years of age),     is one component of  a comprehensive surveillance     program.  Test performance has     been validated by [facility] for patients greater     than or equal to 48 year old.     It is not intended     to diagnose infection nor to     guide or monitor treatment.     RESULT CALLED TO, READ BACK BY AND VERIFIED WITH:     Jose Persia RN [phone] 0617 EBANKS COLCLOUGH, S     Studies: Ct Head Wo Contrast  04/26/2013   CLINICAL DATA:  Fall  EXAM: CT HEAD WITHOUT CONTRAST  TECHNIQUE: Contiguous axial images were obtained from the base of the skull through the vertex without intravenous contrast.  COMPARISON:  01/06/2009  FINDINGS: Global atrophy. Chronic ischemic changes. No mass effect, midline shift, or acute intracranial hemorrhage. Mastoid air cells are clear. Air-fluid levels are present in the sphenoid sinuses.  IMPRESSION: Fluid in the sphenoid sinuses. No acute intracranial pathology.   Electronically Signed   By: Maryclare Bean M.D.   On:  04/26/2013 16:38    Scheduled Meds: . amLODipine  5 mg Oral Daily  . aspirin EC  325 mg Oral Q breakfast  . Chlorhexidine Gluconate Cloth  6 each Topical Q0600  . DULoxetine  60 mg Oral Daily  . ferrous sulfate  325 mg Oral Q breakfast  . latanoprost  1 drop Both Eyes QHS  . loratadine  10 mg Oral Daily  . loteprednol  1 drop Both Eyes BID  . metoprolol succinate  25 mg Oral Daily  . multivitamin-lutein  1 capsule Oral Daily  . mupirocin ointment  1 application Nasal BID  . pantoprazole  40 mg Oral Daily  . polyethylene glycol  17 g Oral Daily  . senna  2 tablet Oral BID  . timolol  1 drop Both Eyes BID   Continuous Infusions: . sodium chloride 10 mL/hr at 04/24/13 0206  . sodium chloride 20 mL/hr (04/24/13 2217)    Principal Problem:   Hip fracture Active Problems:   HTN (hypertension)   Anemia    Time spent: 25    [facility], Rayyan Orsborn  [facility] Pager [phone]. If 7PM-7AM, please contact night-coverage at [email or web address], password [facility] 04/27/2013, 8:34 AM  LOS: 4 days

## 2013-04-27 NOTE — Progress Notes (Signed)
Patient ID: Yvette Carroll, female   DOB: 03-17-1920, 79 y.o.   MRN: [id number] Patient is stable for discharge to skilled nursing. Dressing changed today. Touchdown weightbearing on the right. I'll followup in 2 weeks.

## 2013-04-27 NOTE — Progress Notes (Signed)
Clinical Social Work Department BRIEF PSYCHOSOCIAL ASSESSMENT 04/27/2013  Patient:  Yvette Carroll, Yvette Carroll     Account Number:  [id number]     Admit date:  04/23/2013  Clinical Social Worker:  Illene Silver  Date/Time:  04/27/2013 08:34 PM  Referred by:  Physician  Date Referred:  04/24/2013 Referred for  SNF Placement   Other Referral:   Interview type:  Family Other interview type:    PSYCHOSOCIAL DATA Living Status:  FACILITY Admitted from facility:  [facility] Level of care:  Assisted Living Primary support name:  Medical illustrator Primary support relationship to patient:  CHILD, ADULT Degree of support available:   good    CURRENT CONCERNS Current Concerns  Post-Acute Placement   Other Concerns:    SOCIAL WORK ASSESSMENT / PLAN CSW spoke with pt's dtr via phone re: dcp for pt.  Pt will need SNF ar rehab and dtr is agreeable to having pt's info faxed out to [facility] for bed offers.  Dtr has already contacted [facility] re: pt needing rehab bed.  CSW will con't to follow for bed offers/tx to facility.   Assessment/plan status:  Psychosocial Support/Ongoing Assessment of Needs Other assessment/ plan:   Information/referral to community resources:    PATIENT'S/FAMILY'S RESPONSE TO PLAN OF CARE: Dtr appreciative of CSW involvement/phone call.  Wants pt to remain in hospital until her anemia is corrected.

## 2013-04-27 NOTE — Progress Notes (Addendum)
Clinical Social Work Department CLINICAL SOCIAL WORK PLACEMENT NOTE 04/27/2013  Patient:  Yvette Carroll, Yvette Carroll  Account Number:  [id number] Admit date:  04/23/2013  Clinical Social Worker:  Pollyann Savoy, LCSW  Date/time:  04/27/2013 08:31 PM  Clinical Social Work is seeking post-discharge placement for this patient at the following level of care:   SKILLED NURSING   (*CSW will update this form in Epic as items are completed)   04/27/2013  Patient/family provided with [facility] Department of Clinical Social Work's list of facilities offering this level of care within the geographic area requested by the patient (or if unable, by the patient's family).  04/27/2013  Patient/family informed of their freedom to choose among providers that offer the needed level of care, that participate in Medicare, Medicaid or managed care program needed by the patient, have an available bed and are willing to accept the patient.  04/27/2013  Patient/family informed of [facility]' ownership interest in [facility], as well as of the fact that they are under no obligation to receive care at this facility.  PASARR submitted to EDS on  PASARR number received from EDS on   FL2 transmitted to all facilities in geographic area requested by pt/family on  04/27/2013 FL2 transmitted to all facilities within larger geographic area on   Patient informed that his/her managed care company has contracts with or will negotiate with  certain facilities, including the following:     Patient/family informed of bed offers received:  04/28/2013 Patient chooses bed at  Physician recommends and patient chooses bed at    Patient to be transferred to  on   Patient to be transferred to facility by   The following physician request were entered in Epic:   Additional Comments:

## 2013-04-27 NOTE — Progress Notes (Signed)
OT Cancellation Note and Discharge  Patient Details Name: Yvette Carroll MRN: [id number] DOB: [birth year]   Cancelled Treatment:    Reason Eval/Treat Not Completed: Other (comment). Pt plan is SNF and pt is Medicare, OT eval not needed. Will defer OT eval to that facility as they deem appropriate. Acute OT will sign off.  [name] [phone] 04/27/2013, 7:54 AM

## 2013-04-28 LAB — CBC
MCH: 30.6 pg (ref 26.0–34.0)
MCHC: 33 g/dL (ref 30.0–36.0)
MCV: 92.9 fL (ref 78.0–100.0)
Platelets: 213 10*3/uL (ref 150–400)
RBC: 2.94 MIL/uL — ABNORMAL LOW (ref 3.87–5.11)
WBC: 6.3 10*3/uL (ref 4.0–10.5)

## 2013-04-28 LAB — BASIC METABOLIC PANEL
CO2: 30 mEq/L (ref 19–32)
Calcium: 8.5 mg/dL (ref 8.4–10.5)
Chloride: 99 mEq/L (ref 96–112)
Creatinine, Ser: 0.64 mg/dL (ref 0.50–1.10)
GFR calc Af Amer: 88 mL/min — ABNORMAL LOW (ref >90)
GFR calc non Af Amer: 76 mL/min — ABNORMAL LOW (ref >90)
Sodium: 136 mEq/L (ref 135–145)

## 2013-04-28 LAB — TYPE AND SCREEN: Unit division: 0

## 2013-04-28 MED ORDER — OXYCODONE HCL ER 10 MG PO T12A
10.0000 mg | EXTENDED_RELEASE_TABLET | Freq: Once | ORAL | Status: AC
  Administered 2013-04-28: 10 mg via ORAL
  Filled 2013-04-28: qty 1

## 2013-04-28 MED ORDER — OXYCODONE HCL ER 10 MG PO T12A: 10.0000 mg | EXTENDED_RELEASE_TABLET | Freq: Two times a day (BID) | ORAL | Status: DC

## 2013-04-28 NOTE — Discharge Summary (Addendum)
Physician Discharge Summary  Yvette Carroll ZOX:[id number] DOB: 12/20/1919 DOA: 04/23/2013  PCP: Pearson Grippe, MD  Admit date: 04/23/2013 Discharge date: 04/28/2013  Time spent: 35 minutes  Recommendations for Outpatient Follow-up:  1. Cbc 2 weeks re Hgb  Discharge Diagnoses:  Principal Problem:   Hip fracture Active Problems:   HTN (hypertension)   Anemia   Discharge Condition: improved  Diet recommendation: cardiac  Filed Weights   04/24/13 0300 04/25/13 0651  Weight: 59.421 kg (131 lb) 59.24 kg (130 lb 9.6 oz)    History of present illness:  Yvette Carroll is a 79 y.o. female was brought from the nursing home after patient had a fall. Patient states she was raising her head when she fell very dizzy and fell. She did not lose consciousness or did not have any focal deficits. Patient had pain in the right hip and was brought to the ER and x-rays reveal right hip fracture. Patient has been admitted for further management. On-call orthopedic surgeon Dr. Lajoyce Corners was consulted. Patient denies any chest pain or shortness of breath. Patient has chronic vertigo and chronic pain.   Hospital Course:  Right hip fracture - s/p IM  -PT recs SNF  Hypertension - continue present medications  Anemia - s/p 1 unit PRBC for ABLA  Hyponatremia  -resolved   Procedures: IM by ortho  Consultations:  ortho  Discharge Exam: Filed Vitals:   04/28/13 0526  BP: 151/62  Pulse: 79  Temp: 97.1 F (36.2 C)  Resp: 16    General: pleasant/cooperative Cardiovascular: rrr Respiratory: clear  Discharge Instructions      Discharge Orders   Future Orders Complete By Expires   Diet - low sodium heart healthy  As directed    Discharge instructions  As directed    Comments:     CBC 2 weeks   Increase activity slowly  As directed    Touch down weight bearing  As directed    Questions:     Laterality:     Extremity:     Touch down weight bearing  As directed    Questions:      Laterality:     Extremity:         Medication List    STOP taking these medications       aspirin 81 MG chewable tablet  Replaced by:  aspirin EC 81 MG tablet     traMADol-acetaminophen 37.5-325 MG per tablet  Commonly known as:  ULTRACET      TAKE these medications       Alpha-Lipoic Acid 600 MG Caps  Take 600 mg by mouth 2 (two) times daily. 8am, 5pm     amLODipine 5 MG tablet  Commonly known as:  NORVASC  Take 5 mg by mouth daily.     aspirin EC 81 MG tablet  Take 1 tablet (81 mg total) by mouth daily.     bimatoprost 0.01 % Soln  Commonly known as:  LUMIGAN  Place 1 drop into both eyes at bedtime.     DULoxetine 60 MG capsule  Commonly known as:  CYMBALTA  Take 60 mg by mouth daily.     ferrous sulfate 325 (65 FE) MG tablet  Take 325 mg by mouth daily with breakfast.     loratadine 10 MG tablet  Commonly known as:  CLARITIN  Take 10 mg by mouth daily.     loteprednol 0.5 % ophthalmic suspension  Commonly known as:  LOTEMAX  Place 1  drop into both eyes 2 (two) times daily. 8am, 8pm     metoprolol succinate 25 MG 24 hr tablet  Commonly known as:  TOPROL-XL  Take 25 mg by mouth daily.     omeprazole 20 MG capsule  Commonly known as:  PRILOSEC  Take 20 mg by mouth 2 (two) times daily. 8am, 8pm     OxyCODONE 10 mg T12a 12 hr tablet  Commonly known as:  OXYCONTIN  Take 1 tablet (10 mg total) by mouth every 12 (twelve) hours. 8am, 8pm     polyethylene glycol packet  Commonly known as:  MIRALAX / GLYCOLAX  Take 17 g by mouth daily. Mix with 8 oz liquid and drink     PRESERVISION AREDS 2 Caps  Take 1 capsule by mouth 2 (two) times daily. 8am, 5pm     QC MINERAL OIL HEAVY PO  Place 2 drops into both ears once a week. Every Friday (for ear wax)     senna 8.6 MG Tabs tablet  Commonly known as:  SENOKOT  Take 2 tablets by mouth 2 (two) times daily. 8am, 4pm     SYSTANE 0.4-0.3 % Soln  Generic drug:  Polyethyl Glycol-Propyl Glycol  Place 1 drop into  both eyes 4 (four) times daily. 8am, 12pm, 5pm, 8pm     timolol 0.5 % ophthalmic solution  Commonly known as:  BETIMOL  Place 1 drop into both eyes 2 (two) times daily. 8am, 5pm     traMADol 50 MG tablet  Commonly known as:  ULTRAM  Take 1 tablet (50 mg total) by mouth every 6 (six) hours as needed for pain. Maximum dose= 8 tablets per day     Vitamin D 2000 UNITS Caps  Take 2,000 Units by mouth daily.       Allergies  Allergen Reactions  . Macrolides And Ketolides Other (See Comments)    Per MAR  . Penicillins Swelling  . Betadine [Povidone Iodine] Itching and Rash   Follow-up Information   Follow up with DUDA,MARCUS V, MD In 2 weeks.   Specialty:  Orthopedic Surgery   Contact information:   [address] [place] [address] [phone]       Follow up with Pearson Grippe, MD In 1 week.   Specialty:  Internal Medicine   Contact information:   [address] [place] [address] [phone]        The results of significant diagnostics from this hospitalization (including imaging, microbiology, ancillary and laboratory) are listed below for reference.    Significant Diagnostic Studies: Dg Hip Complete Right  04/23/2013   CLINICAL DATA:  Fall. Right hip pain.  EXAM: RIGHT HIP - COMPLETE 2+ VIEW  COMPARISON:  08/04/2012  FINDINGS: There is a fracture of the proximal right femur. The primary fractures across the base of the femoral neck with a secondary fracture across the greater trochanter. The major fracture fragments are displaced by 1 cm and there is varus angulation. There is an old healed fracture of the inferior right pubic ramus. An old left proximal femur fracture has been reduced with a compression screw and fixation plate. The orthopedic hardware is well-seated and aligned and the fracture well healed.  The bones are extensively demineralized.  IMPRESSION: Right proximal femur fracture with the fracture across the base of the femoral neck  with a secondary fracture of the greater trochanter with mild displacement and varus angulation.   Electronically Signed   By: Renard Hamper.D.  On: 04/23/2013 21:48   Dg Hip Operative Right  04/24/2013   *RADIOLOGY REPORT*  Clinical Data: IM nail of the right hip, subsequent encounter.  DG OPERATIVE RIGHT HIP  Comparison: Right hip radiographs - 04/23/2013  Findings:  Two spot fluoroscopic images of the right hip are provided for review.  Images demonstrate the sequela of intramedullary rod fixation of the proximal femur which is traversed distally with a cancellous screw.  There is dynamic screw fixation of the right femoral neck. Alignment now appears near anatomic.  No radiopaque foreign body.  IMPRESSION: Post ORIF of the right femoral neck fracture without evidence of complication.   Original Report Authenticated By: Tacey Ruiz, MD   Dg Femur Right  04/23/2013   CLINICAL DATA:  Fall. Right hip pain.  EXAM: RIGHT FEMUR - 2 VIEW  COMPARISON:  08/04/2012  FINDINGS: There is a fracture of the proximal right femur. This appears to the across the base of the femoral neck. It may involve the greater trochanter which would make this an intertrochanteric fracture. The major fracture fragments are displaced by 1 cm and there is varus angulation.  No other evidence of a fracture. The knee and hip joints are normally aligned. The bones are extensively demineralized. Dense vascular calcification is noted along the superficial femoral artery.  IMPRESSION: Right proximal femur fracture. This is either across the base of the femoral neck or across the base the femoral neck to include the greater trochanter. Is mildly displaced with varus angulation.   Electronically Signed   By: Amie Portland M.D.   On: 04/23/2013 21:47   Ct Head Wo Contrast  04/26/2013   CLINICAL DATA:  Fall  EXAM: CT HEAD WITHOUT CONTRAST  TECHNIQUE: Contiguous axial images were obtained from the base of the skull through the vertex without  intravenous contrast.  COMPARISON:  01/06/2009  FINDINGS: Global atrophy. Chronic ischemic changes. No mass effect, midline shift, or acute intracranial hemorrhage. Mastoid air cells are clear. Air-fluid levels are present in the sphenoid sinuses.  IMPRESSION: Fluid in the sphenoid sinuses. No acute intracranial pathology.   Electronically Signed   By: Maryclare Bean M.D.   On: 04/26/2013 16:38    Microbiology: Recent Results (from the past 240 hour(s))  SURGICAL PCR SCREEN     Status: Abnormal   Collection Time    04/24/13  1:55 AM      Result Value Range Status   MRSA, PCR POSITIVE (*) NEGATIVE Final   Comment: RESULT CALLED TO, READ BACK BY AND VERIFIED WITH:     Jose Persia RN [id number] [phone] EBANKS COLCLOUGH, S   Staphylococcus aureus POSITIVE (*) NEGATIVE Final   Comment:            The Xpert SA Assay (FDA     approved for NASAL specimens     in patients over 26 years of age),     is one component of     a comprehensive surveillance     program.  Test performance has     been validated by [employer] for patients greater     than or equal to 6 year old.     It is not intended     to diagnose infection nor to     guide or monitor treatment.     RESULT CALLED TO, READ BACK BY AND VERIFIED WITH:     Jose Persia RN [phone] 0617 Guy Begin, S     Labs:  Basic Metabolic Panel:  Recent Labs Lab 04/24/13 0550 04/25/13 0518 04/26/13 0600 04/27/13 0600 04/28/13 0542  NA 131* 131* 132* 133* 136  K 4.1 3.9 3.9 4.0 4.6  CL 93* 95* 96 98 99  CO2 25 26 28 27 30   GLUCOSE 147* 127* 114* 105* 107*  BUN 13 13 13 13 11   CREATININE 0.55 0.66 0.69 0.68 0.64  CALCIUM 8.7 7.8* 8.1* 8.1* 8.5   Liver Function Tests:  Recent Labs Lab 04/24/13 0550  AST 22  ALT 20  ALKPHOS 145*  BILITOT 0.3  PROT 6.8  ALBUMIN 3.5   No results found for this basename: LIPASE, AMYLASE,  in the last 168 hours No results found for this basename: AMMONIA,  in the last 168 hours CBC:  Recent  Labs Lab 04/23/13 2016 04/24/13 0550 04/25/13 0518 04/26/13 0600 04/27/13 0600 04/28/13 0542  WBC 4.9 9.8 8.1 6.4 6.3 6.3  NEUTROABS 3.2 8.2*  --   --   --   --   HGB 11.7* 12.5 9.0* 7.6* 7.6* 9.0*  HCT 35.1* 36.9 26.4* 22.7* 22.6* 27.3*  MCV 93.4 92.3 91.7 92.3 92.2 92.9  PLT 240 239 182 160 181 213   Cardiac Enzymes: No results found for this basename: CKTOTAL, CKMB, CKMBINDEX, TROPONINI,  in the last 168 hours BNP: BNP (last 3 results) No results found for this basename: PROBNP,  in the last 8760 hours CBG:  Recent Labs Lab 04/25/13 2009 04/26/13 0003 04/26/13 0352 04/26/13 0827 04/26/13 1239  GLUCAP 147* 120* 133* 135* 108*       Signed:  Boniface Goffe  [facility] 04/28/2013, 9:52 AM

## 2013-04-28 NOTE — Progress Notes (Addendum)
CSW Proofreader) spoke with pt daughter who would like for pt to dc to [facility]. Pt daughter and facility are currently trying to coordinate completion of paperwork. CSW has prepared pt dc packet and placed with shadow chart. CSW awaiting confirmation call from facility before setting up transportation. Pt nurse informed.  Addendum 1:28pm: CSW received call from facility confirming transportation can be set up for pt. CSW called [facility] and informed pt nurse. CSW signing off.  Isabeau Mccalla, LCSWA [phone]

## 2013-04-29 ENCOUNTER — Other Ambulatory Visit: Payer: Self-pay | Admitting: *Deleted

## 2013-04-29 ENCOUNTER — Non-Acute Institutional Stay (SKILLED_NURSING_FACILITY): Payer: Medicare Other | Admitting: Adult Health

## 2013-04-29 DIAGNOSIS — K219 Gastro-esophageal reflux disease without esophagitis: Secondary | ICD-10-CM

## 2013-04-29 DIAGNOSIS — D649 Anemia, unspecified: Secondary | ICD-10-CM

## 2013-04-29 DIAGNOSIS — S72001D Fracture of unspecified part of neck of right femur, subsequent encounter for closed fracture with routine healing: Secondary | ICD-10-CM

## 2013-04-29 DIAGNOSIS — G8929 Other chronic pain: Secondary | ICD-10-CM

## 2013-04-29 DIAGNOSIS — K5900 Constipation, unspecified: Secondary | ICD-10-CM

## 2013-04-29 DIAGNOSIS — F329 Major depressive disorder, single episode, unspecified: Secondary | ICD-10-CM

## 2013-04-29 DIAGNOSIS — M545 Low back pain: Secondary | ICD-10-CM

## 2013-04-29 DIAGNOSIS — H409 Unspecified glaucoma: Secondary | ICD-10-CM

## 2013-04-29 DIAGNOSIS — I2510 Atherosclerotic heart disease of native coronary artery without angina pectoris: Secondary | ICD-10-CM

## 2013-04-29 DIAGNOSIS — J309 Allergic rhinitis, unspecified: Secondary | ICD-10-CM

## 2013-04-29 DIAGNOSIS — S72009D Fracture of unspecified part of neck of unspecified femur, subsequent encounter for closed fracture with routine healing: Secondary | ICD-10-CM

## 2013-04-29 DIAGNOSIS — J329 Chronic sinusitis, unspecified: Secondary | ICD-10-CM

## 2013-04-29 DIAGNOSIS — I10 Essential (primary) hypertension: Secondary | ICD-10-CM

## 2013-04-29 MED ORDER — OXYCODONE HCL ER 10 MG PO T12A: EXTENDED_RELEASE_TABLET | ORAL | Status: DC

## 2013-04-29 NOTE — Telephone Encounter (Signed)
rx refilled per protocol  

## 2013-04-30 DIAGNOSIS — J309 Allergic rhinitis, unspecified: Secondary | ICD-10-CM | POA: Insufficient documentation

## 2013-04-30 DIAGNOSIS — G8929 Other chronic pain: Secondary | ICD-10-CM | POA: Insufficient documentation

## 2013-04-30 DIAGNOSIS — I2510 Atherosclerotic heart disease of native coronary artery without angina pectoris: Secondary | ICD-10-CM | POA: Insufficient documentation

## 2013-04-30 DIAGNOSIS — H409 Unspecified glaucoma: Secondary | ICD-10-CM | POA: Insufficient documentation

## 2013-04-30 DIAGNOSIS — K5900 Constipation, unspecified: Secondary | ICD-10-CM | POA: Insufficient documentation

## 2013-04-30 DIAGNOSIS — K219 Gastro-esophageal reflux disease without esophagitis: Secondary | ICD-10-CM | POA: Insufficient documentation

## 2013-04-30 NOTE — Progress Notes (Signed)
Patient ID: Yvette Carroll, female   DOB: [birth year], 79 y.o.   MRN: [id number]        PROGRESS NOTE  DATE: 04/29/2013  FACILITY: Nursing Home Location: [facility]  LEVEL OF CARE: SNF (31)  Acute Visit  CHIEF COMPLAINT:  Follow-up hospitalization  HISTORY OF PRESENT ILLNESS: This is a 79 year old female who has been admitted to [facility] on 04/28/13 from [facility]. She fell in a nursing home and sustained a right hip fracture and now S/P IM Nail. She has been admitted for a short-term rehabilitation. She is complaining of nasal congestion with sore throat.  REASSESSMENT OF ONGOING PROBLEM(S):  HTN: Pt 's HTN remains stable.  Denies CP, sob, DOE, pedal edema, headaches, dizziness or visual disturbances.  No complications from the medications currently being used.  Last BP : 114/69  CAD: The angina has been stable. The patient denies dyspnea on exertion, orthopnea, pedal edema, palpitations and paroxysmal nocturnal dyspnea. No complications noted from the medication presently being used.  GERD: pt's GERD is stable.  Denies ongoing heartburn, abd. Pain, nausea or vomiting.  Currently on a PPI & tolerates it without any adverse reactions.  PAST MEDICAL HISTORY : Reviewed.  No changes.  CURRENT MEDICATIONS: Reviewed per [facility]  REVIEW OF SYSTEMS:  GENERAL: no change in appetite, no fatigue, no weight changes, no fever, chills or weakness  MOUTH/THROAT:  +sore throat RESPIRATORY: no cough, SOB, DOE, wheezing, hemoptysis CARDIAC: no chest pain, edema or palpitations GI: no abdominal pain, diarrhea, constipation, heart burn, nausea or vomiting MUSCULOSKELETAL:  +low back pain    PHYSICAL EXAMINATION  VS:  T 98      P95      RR16      B114/69     POX %     WT132.6 (Lb)  GENERAL: no acute distress, normal body habitus EYES: conjunctivae normal, sclerae normal, normal eye lids NECK: supple, trachea midline, no neck masses, no thyroid tenderness, no  thyromegaly LYMPHATICS: no LAN in the neck, no supraclavicular LAN RESPIRATORY: breathing is even & unlabored, BS CTAB CARDIAC: RRR, no murmur,no extra heart sounds, no edema GI: abdomen soft, normal BS, no masses, no tenderness, no hepatomegaly, no splenomegaly PSYCHIATRIC: the patient is alert & oriented to person, affect & behavior appropriate  LABS/RADIOLOGY: 04/28/13 sodium 136 potassium 4.6 glucose 107 BUN 11 creatinine 0.64 WBC 6.3 hemoglobin 9.0 hematocrit 27.3   ASSESSMENT/PLAN:  Right hip fracture S/P IM Nail - for rehabilitation  Rhinosinusitis - start Doxycycline 100 mg 1 PO BID x 10 days  Chronic Low Back Pain -  Add Oxycodone 5 mg 1 tab PO Q 4 hours PRN; discontinue Tramadol  Hypertension - well controlled  Anemia - continue ferrous sulfate  GERD - stable  CAD - stable  Constipation - no complaints  Depression - continue Cymbalta  Glaucoma - continue Lumigan and Timolol eye drops  Allergic Rhinitis - stable   CPT CODE: 40981

## 2013-05-01 ENCOUNTER — Non-Acute Institutional Stay (SKILLED_NURSING_FACILITY): Payer: Medicare Other | Admitting: Internal Medicine

## 2013-05-01 DIAGNOSIS — S72001S Fracture of unspecified part of neck of right femur, sequela: Secondary | ICD-10-CM

## 2013-05-01 DIAGNOSIS — S72009S Fracture of unspecified part of neck of unspecified femur, sequela: Secondary | ICD-10-CM

## 2013-05-01 DIAGNOSIS — K219 Gastro-esophageal reflux disease without esophagitis: Secondary | ICD-10-CM

## 2013-05-01 DIAGNOSIS — D62 Acute posthemorrhagic anemia: Secondary | ICD-10-CM

## 2013-05-01 DIAGNOSIS — I10 Essential (primary) hypertension: Secondary | ICD-10-CM

## 2013-05-27 DIAGNOSIS — D62 Acute posthemorrhagic anemia: Secondary | ICD-10-CM | POA: Insufficient documentation

## 2013-05-27 NOTE — Progress Notes (Signed)
Patient ID: Yvette Carroll, female   DOB: 10-May-1920, 79 y.o.   MRN: [id number]        HISTORY & PHYSICAL  DATE: 05/01/2013   FACILITY: [facility]  LEVEL OF CARE: SNF (31)  ALLERGIES:  Allergies  Allergen Reactions  . Macrolides And Ketolides Other (See Comments)    Per MAR  . Penicillins Swelling  . Betadine [Povidone Iodine] Itching and Rash    CHIEF COMPLAINT:  Manage right hip fracture, acute blood loss anemia, and hypertension.    HISTORY OF PRESENT ILLNESS:  The patient is a 79 year-old, Caucasian female.    HIP FRACTURE: The patient had a mechanical fall and sustained a femur fracture.  Patient subsequently underwent surgical repair and tolerated the procedure well. Patient is admitted to this facility for short-term rehabilitation. Patient denies hip pain currently. No complications reported from the pain medications currently being used.    ANEMIA:  Postoperatively, patient suffered acute blood loss.  The anemia has been stable. The patient denies fatigue, melena or hematochezia. No complications from the medications currently being used.  Last hemoglobins are:    9, 7.6, 7.6.    HTN: Pt 's HTN remains stable.  Denies CP, sob, DOE, pedal edema, headaches, dizziness or visual disturbances.  No complications from the medications currently being used.  Last BP :  135/77, 135/80.  PAST MEDICAL HISTORY :  Past Medical History  Diagnosis Date  . GERD (gastroesophageal reflux disease)   . Hypertension   . Glaucoma   . Depression   . Lumbar spinal stenosis   . Right knee DJD   . Osteoporosis   . Vertigo     PAST SURGICAL HISTORY: Past Surgical History  Procedure Laterality Date  . Fracture surgery    . Neck surgery    . Eye surgery    . Abdominal hysterectomy    . Hip fracture surgery Left   . Intramedullary (im) nail intertrochanteric Right 04/24/2013    Procedure: INTRAMEDULLARY (IM) NAIL INTERTROCHANTRIC;  Surgeon: Nadara Mustard, MD;  Location:  [facility] OR;  Service: Orthopedics;  Laterality: Right;    SOCIAL HISTORY:  reports that she has never smoked. She has never used smokeless tobacco. She reports that she does not drink alcohol or use illicit drugs.  FAMILY HISTORY: None  CURRENT MEDICATIONS: Reviewed per MAR  REVIEW OF SYSTEMS:  See HPI otherwise 14 point ROS is negative.  PHYSICAL EXAMINATION  VS:  T 97.8       P 80      RR 17     BP 135/77      POX 97%        WT (Lb)  GENERAL: no acute distress, normal body habitus EYES: conjunctivae normal, sclerae normal, normal eye lids MOUTH/THROAT: lips without lesions,no lesions in the mouth,tongue is without lesions,uvula elevates in midline NECK: supple, trachea midline, no neck masses, no thyroid tenderness, no thyromegaly LYMPHATICS: no LAN in the neck, no supraclavicular LAN RESPIRATORY: breathing is even & unlabored, BS CTAB CARDIAC: RRR, no murmur,no extra heart sounds, no edema GI:  ABDOMEN: abdomen soft, normal BS, no masses, no tenderness  LIVER/SPLEEN: no hepatomegaly, no splenomegaly MUSCULOSKELETAL: HEAD: normal to inspection & palpation BACK: no kyphosis, scoliosis or spinal processes tenderness EXTREMITIES: LEFT UPPER EXTREMITY: full range of motion, normal strength & tone RIGHT UPPER EXTREMITY:  full range of motion, normal strength & tone LEFT LOWER EXTREMITY:  full range of motion, normal strength & tone RIGHT  LOWER EXTREMITY: strength intact, range of motion not tested due to surgery   PSYCHIATRIC: the patient is alert & oriented to person, affect & behavior appropriate  LABS/RADIOLOGY: Right hip x-ray:  Showed hip fracture.    Right hip x-ray postoperatively:  Showed ORIF of the right femoral neck fracture.    CT of the head:  No acute findings.    MRSA by PCR positive.    Staph aureus by PCR positive.    Glucose 107, otherwise BMP normal.    Alkaline phosphatase 145, otherwise liver profile normal.    Hemoglobin 9, MCV 92.9, otherwise CBC  normal.    ASSESSMENT/PLAN:  Right hip fracture.  Status post IM nail.  Continue rehabilitation.    Acute blood loss anemia.  Status post transfusion.  Continue iron.    Hypertension.  Well controlled.     GERD.  Well controlled.     Constipation.  Well controlled.    Allergic rhinitis.  Well controlled.    Check CBC and BMP.    I have reviewed patient's medical records received at admission/from hospitalization.  CPT CODE: 29528

## 2013-05-28 ENCOUNTER — Other Ambulatory Visit: Payer: Self-pay | Admitting: *Deleted

## 2013-05-28 ENCOUNTER — Other Ambulatory Visit: Payer: Self-pay

## 2013-05-28 MED ORDER — OXYCODONE HCL ER 20 MG PO T12A: 20.0000 mg | EXTENDED_RELEASE_TABLET | Freq: Two times a day (BID) | ORAL | Status: DC

## 2013-05-28 MED ORDER — OXYCODONE HCL ER 10 MG PO T12A: EXTENDED_RELEASE_TABLET | ORAL | Status: DC

## 2013-05-28 NOTE — Telephone Encounter (Signed)
rx filled per protocol  

## 2013-06-04 ENCOUNTER — Non-Acute Institutional Stay (SKILLED_NURSING_FACILITY): Payer: Medicare Other | Admitting: Adult Health

## 2013-06-04 DIAGNOSIS — F3289 Other specified depressive episodes: Secondary | ICD-10-CM

## 2013-06-04 DIAGNOSIS — S72009D Fracture of unspecified part of neck of unspecified femur, subsequent encounter for closed fracture with routine healing: Secondary | ICD-10-CM

## 2013-06-04 DIAGNOSIS — G8929 Other chronic pain: Secondary | ICD-10-CM

## 2013-06-04 DIAGNOSIS — M545 Low back pain, unspecified: Secondary | ICD-10-CM

## 2013-06-04 DIAGNOSIS — J309 Allergic rhinitis, unspecified: Secondary | ICD-10-CM

## 2013-06-04 DIAGNOSIS — K219 Gastro-esophageal reflux disease without esophagitis: Secondary | ICD-10-CM

## 2013-06-04 DIAGNOSIS — I10 Essential (primary) hypertension: Secondary | ICD-10-CM

## 2013-06-04 DIAGNOSIS — D649 Anemia, unspecified: Secondary | ICD-10-CM

## 2013-06-04 DIAGNOSIS — S72001D Fracture of unspecified part of neck of right femur, subsequent encounter for closed fracture with routine healing: Secondary | ICD-10-CM

## 2013-06-04 DIAGNOSIS — F329 Major depressive disorder, single episode, unspecified: Secondary | ICD-10-CM

## 2013-06-04 DIAGNOSIS — I2510 Atherosclerotic heart disease of native coronary artery without angina pectoris: Secondary | ICD-10-CM

## 2013-06-04 DIAGNOSIS — H409 Unspecified glaucoma: Secondary | ICD-10-CM

## 2013-06-04 DIAGNOSIS — K5900 Constipation, unspecified: Secondary | ICD-10-CM

## 2013-06-10 NOTE — Progress Notes (Signed)
Patient ID: Yvette Carroll, female   DOB: [birth year], 79 y.o.   MRN: [id number]              PROGRESS NOTE  DATE: 06/04/2013   FACILITY: [facility]  LEVEL OF CARE: SNF (31)  Acute Visit  CHIEF COMPLAINT:  Discharge Notes  HISTORY OF PRESENT ILLNESS:This is a 79 year old female who is for discharge to and Assisted Living Facility with Home Health PT, OT and Nursing. DME: wheelchair and cushion. She has been admitted to [facility] on 04/28/13 from [facility]. She fell in a nursing home and sustained a right hip fracture and now S/P IM Nail.  Patient was admitted to this facility for short-term rehabilitation after the patient's recent hospitalization.  Patient has completed SNF rehabilitation and therapy has cleared the patient for discharge.  Reassessment of ongoing problem(s):  HTN: Pt 's HTN remains stable.  Denies CP, sob, DOE, pedal edema, headaches, dizziness or visual disturbances.  No complications from the medications currently being used.  Last BP : 118/72  DEPRESSION: The depression remains stable. Patient denies ongoing feelings of sadness, insomnia, anedhonia or lack of appetite. No complications reported from the medications currently being used. Staff do not report behavioral problems.  ANEMIA: The anemia has been stable. The patient denies fatigue, melena or hematochezia. No complications from the medications currently being used.  10/14  hgb 9.0  PAST MEDICAL HISTORY : Reviewed.  No changes.  CURRENT MEDICATIONS: Reviewed per [facility]  REVIEW OF SYSTEMS:  GENERAL: no change in appetite, no fatigue, no weight changes, no fever, chills or weakness RESPIRATORY: no cough, SOB, DOE, wheezing, hemoptysis CARDIAC: no chest pain, edema or palpitations GI: no abdominal pain, diarrhea, constipation, heart burn, nausea or vomiting  PHYSICAL EXAMINATION  VS:  T97.7       P78       RR18      BP118/72      POX96 %       WT128 (Lb)  GENERAL: no acute  distress, normal body habitus NECK: supple, trachea midline, no neck masses, no thyroid tenderness, no thyromegaly LYMPHATICS: no LAN in the neck, no supraclavicular LAN RESPIRATORY: breathing is even & unlabored, BS CTAB CARDIAC: RRR, no murmur,no extra heart sounds, no edema GI: abdomen soft, normal BS, no masses, no tenderness, no hepatomegaly, no splenomegaly PSYCHIATRIC: the patient is alert & oriented to person, affect & behavior appropriate  LABS/RADIOLOGY: 05/04/13  NA135 potassium 4.1 glucose 117 BUN 14 creatinine 0.6 calcium 8.8 04/28/13 sodium 136 potassium 4.6 glucose 107 BUN 11 creatinine 0.64 WBC 6.3 hemoglobin 9.0 hematocrit 27.3    ASSESSMENT/PLAN:  Right hip fracture S/P IM Nail - for Home health OT, PT and Nursing  Chronic Low Back Pain -  Continue OxycontiCR  Hypertension - well controlled; continue Norvasc and Toprol XL  Anemia - continue ferrous sulfate  GERD - stable; continue Prilosec  CAD - stable; continue Ecotrin 81 mg  Constipation - no complaints; continue senna S. and MiraLax  Depression - continue Cymbalta  Glaucoma - continue Lumigan and Timolol eye drops  Allergic Rhinitis - stable     I have filled out patient's discharge paperwork and written prescriptions.  Patient will receive home health PT, OT and Nursing.  DME provided: wheelchair and cushion  Total discharge time: Greater than 30 minutes Discharge time involved coordination of the discharge process with social worker, nursing staff and therapy department. Medical justification for home health services/DME verified.  CPT  CODE: 02725

## 2014-03-14 ENCOUNTER — Encounter (HOSPITAL_COMMUNITY): Payer: Self-pay | Admitting: Emergency Medicine

## 2014-03-14 ENCOUNTER — Emergency Department (HOSPITAL_COMMUNITY)
Admission: EM | Admit: 2014-03-14 | Discharge: 2014-03-14 | Disposition: A | Discharge status: Home / Self Care | Payer: Medicare Other | Attending: Emergency Medicine | Admitting: Emergency Medicine

## 2014-03-14 ENCOUNTER — Emergency Department (HOSPITAL_COMMUNITY): Payer: Medicare Other

## 2014-03-14 DIAGNOSIS — Z880 Allergy status to penicillin: Secondary | ICD-10-CM | POA: Diagnosis not present

## 2014-03-14 DIAGNOSIS — W010XXA Fall on same level from slipping, tripping and stumbling without subsequent striking against object, initial encounter: Secondary | ICD-10-CM | POA: Insufficient documentation

## 2014-03-14 DIAGNOSIS — Z79899 Other long term (current) drug therapy: Secondary | ICD-10-CM | POA: Diagnosis not present

## 2014-03-14 DIAGNOSIS — M5137 Other intervertebral disc degeneration, lumbosacral region: Secondary | ICD-10-CM | POA: Diagnosis not present

## 2014-03-14 DIAGNOSIS — IMO0002 Reserved for concepts with insufficient information to code with codable children: Secondary | ICD-10-CM | POA: Diagnosis not present

## 2014-03-14 DIAGNOSIS — N390 Urinary tract infection, site not specified: Secondary | ICD-10-CM

## 2014-03-14 DIAGNOSIS — I10 Essential (primary) hypertension: Secondary | ICD-10-CM | POA: Insufficient documentation

## 2014-03-14 DIAGNOSIS — K219 Gastro-esophageal reflux disease without esophagitis: Secondary | ICD-10-CM | POA: Insufficient documentation

## 2014-03-14 DIAGNOSIS — Y9389 Activity, other specified: Secondary | ICD-10-CM | POA: Insufficient documentation

## 2014-03-14 DIAGNOSIS — M51379 Other intervertebral disc degeneration, lumbosacral region without mention of lumbar back pain or lower extremity pain: Secondary | ICD-10-CM | POA: Insufficient documentation

## 2014-03-14 DIAGNOSIS — F329 Major depressive disorder, single episode, unspecified: Secondary | ICD-10-CM | POA: Insufficient documentation

## 2014-03-14 DIAGNOSIS — Z7982 Long term (current) use of aspirin: Secondary | ICD-10-CM | POA: Insufficient documentation

## 2014-03-14 DIAGNOSIS — F3289 Other specified depressive episodes: Secondary | ICD-10-CM | POA: Insufficient documentation

## 2014-03-14 DIAGNOSIS — M5136 Other intervertebral disc degeneration, lumbar region: Secondary | ICD-10-CM

## 2014-03-14 DIAGNOSIS — Y929 Unspecified place or not applicable: Secondary | ICD-10-CM | POA: Insufficient documentation

## 2014-03-14 DIAGNOSIS — S72009A Fracture of unspecified part of neck of unspecified femur, initial encounter for closed fracture: Secondary | ICD-10-CM | POA: Diagnosis not present

## 2014-03-14 LAB — URINE MICROSCOPIC-ADD ON

## 2014-03-14 LAB — URINALYSIS, ROUTINE W REFLEX MICROSCOPIC
BILIRUBIN URINE: NEGATIVE
Glucose, UA: NEGATIVE mg/dL
Hgb urine dipstick: NEGATIVE
Ketones, ur: 15 mg/dL — AB
NITRITE: NEGATIVE
PH: 6 (ref 5.0–8.0)
Protein, ur: NEGATIVE mg/dL
SPECIFIC GRAVITY, URINE: 1.027 (ref 1.005–1.030)
Urobilinogen, UA: 1 mg/dL (ref 0.0–1.0)

## 2014-03-14 MED ORDER — HYDROCODONE-ACETAMINOPHEN 5-325 MG PO TABS
1.0000 | ORAL_TABLET | Freq: Once | ORAL | Status: AC
  Administered 2014-03-14: 1 via ORAL
  Filled 2014-03-14: qty 1

## 2014-03-14 MED ORDER — CEPHALEXIN 500 MG PO CAPS: 500.0000 mg | ORAL_CAPSULE | Freq: Two times a day (BID) | ORAL | Status: DC

## 2014-03-14 MED ORDER — CEPHALEXIN 250 MG PO CAPS
500.0000 mg | ORAL_CAPSULE | Freq: Once | ORAL | Status: AC
  Administered 2014-03-14: 500 mg via ORAL
  Filled 2014-03-14: qty 2

## 2014-03-14 NOTE — Discharge Instructions (Signed)
You have compression fractures of the back. Please see the spine doctor as requested for their opinion. Also PLEASE ASK YOUR DOCTORS AT THE ASSISTED LIVING TO HELP WITH YOUR PAIN REGIMEN, you might need pain doctor referral if pain is severe. Also, we recommend light back exercises, so please inform PT/Ot to help wit that. Also have a UTI, take the meds as prescribed.   Back, Compression Fracture A compression fracture happens when a force is put upon the length of your spine. Slipping and falling on your bottom are examples of such a force. When this happens, sometimes the force is great enough to compress the building blocks (vertebral bodies) of your spine. Although this causes a lot of pain, this can usually be treated at home, unless your caregiver feels hospitalization is needed for pain control. Your backbone (spinal column) is made up of 24 main vertebral bodies in addition to the sacrum and coccyx (see illustration). These are held together by tough fibrous tissues (ligaments) and by support of your muscles. Nerve roots pass through the openings between the vertebrae. A sudden wrenching move, injury, or a fall may cause a compression fracture of one of the vertebral bodies. This may result in back pain or spread of pain into the belly (abdomen), the buttocks, and down the leg into the foot. Pain may also be created by muscle spasm alone. Large studies have been undertaken to determine the best possible course of action to help your back following injury and also to prevent future problems. The recommendations are as follows. FOLLOWING A COMPRESSION FRACTURE: Do the following only if advised by your caregiver.   If a back brace has been suggested or provided, wear it as directed.  Do not stop wearing the back brace unless instructed by your caregiver.  When allowed to return to regular activities, avoid a sedentary lifestyle. Actively exercise. Sporadic weekend binges of tennis,  racquetball, or waterskiing may actually aggravate or create problems, especially if you are not in condition for that activity.  Avoid sports requiring sudden body movements until you are in condition for them. Swimming and walking are safer activities.  Maintain good posture.  Avoid obesity.  If not already done, you should have a DEXA scan. Based on the results, be treated for osteoporosis. FOLLOWING ACUTE (SUDDEN) INJURY:  Only take over-the-counter or prescription medicines for pain, discomfort, or fever as directed by your caregiver.  Use bed rest for only the most extreme acute episode. Prolonged bed rest may aggravate your condition. Ice used for acute conditions is effective. Use a large plastic bag filled with ice. Wrap it in a towel. This also provides excellent pain relief. This may be continuous. Or use it for 30 minutes every 2 hours during acute phase, then as needed. Heat for 30 minutes prior to activities is helpful.  As soon as the acute phase (the time when your back is too painful for you to do normal activities) is over, it is important to resume normal activities and work Arboriculturist. Back injuries can cause potentially marked changes in lifestyle. So it is important to attack these problems aggressively.  See your caregiver for continued problems. He or she can help or refer you for appropriate exercises, physical therapy, and work hardening if needed.  If you are given narcotic medications for your condition, for the next 24 hours do not:  Drive.  Operate machinery or power tools.  Sign legal documents.  Do not drink alcohol, or take sleeping pills  or other medications that may interfere with treatment. If your caregiver has given you a follow-up appointment, it is very important to keep that appointment. Not keeping the appointment could result in a chronic or permanent injury, pain, and disability. If there is any problem keeping the appointment, you must  call back to this facility for assistance.  SEEK IMMEDIATE MEDICAL CARE IF:  You develop numbness, tingling, weakness, or problems with the use of your arms or legs.  You develop severe back pain not relieved with medications.  You have changes in bowel or bladder control.  You have increasing pain in any areas of the body. Document Released: 06/25/2005 Document Revised: 11/09/2013 Document Reviewed: 01/28/2008 [facility] Patient Information 2015 [place], [place]. This information is not intended to replace advice given to you by your health care provider. Make sure you discuss any questions you have with your health care provider.  Degenerative Disk Disease Degenerative disk disease is a condition caused by the changes that occur in the cushions of the backbone (spinal disks) as you grow older. Spinal disks are soft and compressible disks located between the bones of the spine (vertebrae). They act like shock absorbers. Degenerative disk disease can affect the whole spine. However, the neck and lower back are most commonly affected. Many changes can occur in the spinal disks with aging, such as:  The spinal disks may dry and shrink.  Small tears may occur in the tough, outer covering of the disk (annulus).  The disk space may become smaller due to loss of water.  Abnormal growths in the bone (spurs) may occur. This can put pressure on the nerve roots exiting the spinal canal, causing pain.  The spinal canal may become narrowed. CAUSES  Degenerative disk disease is a condition caused by the changes that occur in the spinal disks with aging. The exact cause is not known, but there is a genetic basis for many patients. Degenerative changes can occur due to loss of fluid in the disk. This makes the disk thinner and reduces the space between the backbones. Small cracks can develop in the outer layer of the disk. This can lead to the breakdown of the disk. You are more likely to get degenerative  disk disease if you are overweight. Smoking cigarettes and doing heavy work such as weightlifting can also increase your risk of this condition. Degenerative changes can start after a sudden injury. Growth of bone spurs can compress the nerve roots and cause pain.  SYMPTOMS  The symptoms vary from person to person. Some people may have no pain, while others have severe pain. The pain may be so severe that it can limit your activities. The location of the pain depends on the part of your backbone that is affected. You will have neck or arm pain if a disk in the neck area is affected. You will have pain in your back, buttocks, or legs if a disk in the lower back is affected. The pain becomes worse while bending, reaching up, or with twisting movements. The pain may start gradually and then get worse as time passes. It may also start after a major or minor injury. You may feel numbness or tingling in the arms or legs.  DIAGNOSIS  Your caregiver will ask you about your symptoms and about activities or habits that may cause the pain. He or she may also ask about any injuries, diseases, or treatments you have had earlier. Your caregiver will examine you to check for the  range of movement that is possible in the affected area, to check for strength in your extremities, and to check for sensation in the areas of the arms and legs supplied by different nerve roots. An X-ray of the spine may be taken. Your caregiver may suggest other imaging tests, such as magnetic resonance imaging (MRI), if needed.  TREATMENT  Treatment includes rest, modifying your activities, and applying ice and heat. Your caregiver may prescribe medicines to reduce your pain and may ask you to do some exercises to strengthen your back. In some cases, you may need surgery. You and your caregiver will decide on the treatment that is best for you. HOME CARE INSTRUCTIONS   Follow proper lifting and walking techniques as advised by your  caregiver.  Maintain good posture.  Exercise regularly as advised.  Perform relaxation exercises.  Change your sitting, standing, and sleeping habits as advised. Change positions frequently.  Lose weight as advised.  Stop smoking if you smoke.  Wear supportive footwear. SEEK MEDICAL CARE IF:  Your pain does not go away within 1 to 4 weeks. SEEK IMMEDIATE MEDICAL CARE IF:   Your pain is severe.  You notice weakness in your arms, hands, or legs.  You begin to lose control of your bladder or bowel movements. MAKE SURE YOU:   Understand these instructions.  Will watch your condition.  Will get help right away if you are not doing well or get worse. Document Released: 04/22/2007 Document Revised: 09/17/2011 Document Reviewed: 10/27/2013 [facility] Patient Information 2015 [place], [place]. This information is not intended to replace advice given to you by your health care provider. Make sure you discuss any questions you have with your health care provider.

## 2014-03-14 NOTE — ED Notes (Signed)
States son lives in [place] and daughter works at [employer]

## 2014-03-14 NOTE — ED Notes (Signed)
Chronic back pain with worsening over last 2 days.  No known injury. Uses WC most of the time but is able to ambulate

## 2014-03-14 NOTE — ED Notes (Signed)
Per daughter, pt informed her yesterday that when reaching for the refrigerator she slipped and fell 1 week ago.

## 2014-03-14 NOTE — ED Notes (Signed)
Pt discharged home with all belongs, pt alert, oriented and ambulatory upon discharge. Pt transported to [facility] via Doctor, general practice by [employer]. Report called to Morticia LPN at [facility], 1 new RX prescribed, facility aware of new RX and dose given in ED prior to pt discharge

## 2014-03-14 NOTE — ED Notes (Signed)
Patient transported to X-ray 

## 2014-03-14 NOTE — ED Provider Notes (Signed)
CSN: [id number]     Arrival date & time 03/14/14  1422 History   First MD Initiated Contact with Patient 03/14/14 1442     Chief Complaint  Patient presents with  . Back Pain     (Consider location/radiation/quality/duration/timing/severity/associated sxs/prior Treatment) HPI Comments: Pt comes to the ER w/ cc of pain. She is at an assisted living. States that she has broken both her hips, and has hx of sciatica. Pt has some pain that has gotten worse the last few days. Pain is in the back, and shoots down her thigh. She is mostly wheel chair bound, but does use walker occasionally. Pain patch being used at the assisted living facility, but not helping as much. Pt has polyuria, no dysuria, hematuria.  Patient is a 80 y.o. female presenting with back pain. The history is provided by the patient.  Back Pain Associated symptoms: no abdominal pain, no chest pain, no dysuria, no headaches and no weakness     Past Medical History  Diagnosis Date  . GERD (gastroesophageal reflux disease)   . Hypertension   . Glaucoma   . Depression   . Lumbar spinal stenosis   . Right knee DJD   . Osteoporosis   . Vertigo    Past Surgical History  Procedure Laterality Date  . Fracture surgery    . Neck surgery    . Eye surgery    . Abdominal hysterectomy    . Hip fracture surgery Left   . Intramedullary (im) nail intertrochanteric Right 04/24/2013    Procedure: INTRAMEDULLARY (IM) NAIL INTERTROCHANTRIC;  Surgeon: Nadara Mustard, MD;  Location: [facility] OR;  Service: Orthopedics;  Laterality: Right;   History reviewed. No pertinent family history. History  Substance Use Topics  . Smoking status: Never Smoker   . Smokeless tobacco: Never Used  . Alcohol Use: No   OB History   Grav Para Term Preterm Abortions TAB SAB Ect Mult Living                 Review of Systems  Constitutional: Positive for activity change.  Respiratory: Negative for shortness of breath.   Cardiovascular: Negative for chest  pain.  Gastrointestinal: Negative for nausea, vomiting and abdominal pain.  Endocrine: Positive for polyuria.  Genitourinary: Negative for dysuria and hematuria.  Musculoskeletal: Positive for arthralgias, back pain and myalgias. Negative for joint swelling and neck pain.  Skin: Negative for wound.  Neurological: Negative for weakness and headaches.      Allergies  Macrolides and ketolides; Penicillins; and Betadine  Home Medications   Prior to Admission medications   Medication Sig Start Date End Date Taking? Authorizing Provider  acetaminophen (TYLENOL) 500 MG tablet Take 500 mg by mouth 2 (two) times daily.   Yes Historical Provider, MD  Alpha-Lipoic Acid 600 MG CAPS Take 600 mg by mouth 2 (two) times daily. 8am, 5pm   Yes Historical Provider, MD  ALPRAZolam (XANAX) 0.25 MG tablet Take 0.25 mg by mouth 2 (two) times daily as needed for anxiety.   Yes Historical Provider, MD  amLODipine (NORVASC) 5 MG tablet Take 5 mg by mouth daily.   Yes Historical Provider, MD  aspirin EC 81 MG tablet Take 1 tablet (81 mg total) by mouth daily. 04/24/13  Yes Nadara Mustard, MD  bimatoprost (LUMIGAN) 0.01 % SOLN Place 1 drop into both eyes at bedtime.   Yes Historical Provider, MD  Cholecalciferol (VITAMIN D) 2000 UNITS CAPS Take 2,000 Units by mouth daily.   Yes  Historical Provider, MD  DULoxetine (CYMBALTA) 60 MG capsule Take 60 mg by mouth daily.   Yes Historical Provider, MD  ferrous sulfate 325 (65 FE) MG tablet Take 325 mg by mouth daily with breakfast.   Yes Historical Provider, MD  fluticasone (FLONASE) 50 MCG/ACT nasal spray Place 1 spray into both nostrils 2 (two) times daily.   Yes Historical Provider, MD  lidocaine (LIDODERM) 5 % Place 1 patch onto the skin daily. Remove & Discard patch within 12 hours or as directed by MD   Yes Historical Provider, MD  loratadine (CLARITIN) 10 MG tablet Take 10 mg by mouth daily.   Yes Historical Provider, MD  loteprednol (LOTEMAX) 0.5 % ophthalmic  suspension Place 1 drop into both eyes 2 (two) times daily. 8am, 8pm   Yes Historical Provider, MD  meclizine (ANTIVERT) 25 MG tablet Take 25 mg by mouth 2 (two) times daily as needed for dizziness.   Yes Historical Provider, MD  metoprolol succinate (TOPROL-XL) 25 MG 24 hr tablet Take 25 mg by mouth daily.   Yes Historical Provider, MD  Multiple Vitamins-Minerals (PRESERVISION AREDS 2) CAPS Take 1 capsule by mouth 2 (two) times daily. 8am, 5pm   Yes Historical Provider, MD  omeprazole (PRILOSEC) 20 MG capsule Take 20 mg by mouth 2 (two) times daily. 8am, 8pm   Yes Historical Provider, MD  oxyCODONE (OXY IR/ROXICODONE) 5 MG immediate release tablet Take 5 mg by mouth 2 (two) times daily as needed for severe pain.   Yes Historical Provider, MD  Oxycodone HCl 10 MG TABS Take 10 mg by mouth 2 (two) times daily.   Yes Historical Provider, MD  Polyethyl Glycol-Propyl Glycol (SYSTANE) 0.4-0.3 % SOLN Place 1 drop into both eyes 4 (four) times daily. 8am, 12pm, 5pm, 8pm   Yes Historical Provider, MD  polyethylene glycol (MIRALAX / GLYCOLAX) packet Take 17 g by mouth daily. Mix with 8 oz liquid and drink   Yes Historical Provider, MD  senna (SENOKOT) 8.6 MG TABS tablet Take 2 tablets by mouth 2 (two) times daily. 8am, 4pm   Yes Historical Provider, MD  timolol (BETIMOL) 0.25 % ophthalmic solution Place 1 drop into both eyes 2 (two) times daily.   Yes Historical Provider, MD  vitamin B-12 (CYANOCOBALAMIN) 1000 MCG tablet Take 1,000 mcg by mouth daily.   Yes Historical Provider, MD   BP 147/47  Pulse 69  Temp(Src) 98.4 F (36.9 C) (Oral)  Resp 16  SpO2 98% Physical Exam  Nursing note and vitals reviewed. Constitutional: She is oriented to person, place, and time. She appears well-developed and well-nourished.  HENT:  Head: Normocephalic and atraumatic.  Eyes: EOM are normal. Pupils are equal, round, and reactive to light.  Neck: Neck supple.  Cardiovascular: Normal rate, regular rhythm and normal  heart sounds.   No murmur heard. Pulmonary/Chest: Effort normal. No respiratory distress.  Abdominal: Soft. She exhibits no distension. There is no tenderness. There is no rebound and no guarding.  Musculoskeletal:  Pt has tenderness over the lumbar region No step offs, no erythema. Pt has 2+ patellar reflex bilaterally. Able to discriminate between sharp and dull.  Tenderness over the right hip. + straight leg raise exam.  Neurological: She is alert and oriented to person, place, and time.  Skin: Skin is warm and dry.    ED Course  Procedures (including critical care time) Labs Review Labs Reviewed  URINALYSIS, ROUTINE W REFLEX MICROSCOPIC - Abnormal; Notable for the following:    Color, Urine AMBER (*)  APPearance CLOUDY (*)    Ketones, ur 15 (*)    Leukocytes, UA SMALL (*)    All other components within normal limits  URINE MICROSCOPIC-ADD ON - Abnormal; Notable for the following:    Crystals CA OXALATE CRYSTALS (*)    All other components within normal limits    Imaging Review Dg Lumbar Spine Complete  03/14/2014   CLINICAL DATA:  Low back pain after fall.  EXAM: LUMBAR SPINE - COMPLETE 4+ VIEW  COMPARISON:  07/12/2010  FINDINGS: Since the prior radiographs in 2012, there has been progressive loss of height of multiple vertebral bodies. The L1 vertebral body shows approximately 60% loss of height anteriorly. The L3 vertebral body shows diffuse loss of height approaching 60- 70%. The L4 vertebral body is severely compressed and shows approximately 70% loss of height. No significant loss of height at L2 or L5. All of these compression deformities appear likely older in age and there is no visible retropulsion. More pronounced scoliosis present, convex left. No bony lesions are seen  IMPRESSION: Progressive loss of height of multiple lumbar vertebral bodies with significant loss of height at L1, L3 and L4.   Electronically Signed   By: Irish Lack M.D.   On: 03/14/2014 17:27    Dg Hip Complete Right  03/14/2014   CLINICAL DATA:  Right hip pain after fall. Prior ORIF of the hips bilaterally.  EXAM: RIGHT HIP - COMPLETE 2+ VIEW  COMPARISON:  Intraoperative imaging on 04/24/2013 and prior hip films on 04/23/2013.  FINDINGS: No acute fracture or dislocation is identified. Hardware in both proximal femurs shows stable positioning without abnormal surrounding lucency or hardware fracture. Old healed fracture of the right inferior pubic ramus is stable in appearance.  IMPRESSION: No acute fracture identified.   Electronically Signed   By: Irish Lack M.D.   On: 03/14/2014 17:16     EKG Interpretation None      MDM   Final diagnoses:  Compression fracture  Degenerative disc disease, lumbar   DDx includes: - DJD of the back - Spondylitises/ spondylosis - Sciatica - Spinal cord compression - Conus medullaris - Epidural hematoma - Epidural abscess - Lytic/pathologic fracture - Myelitis - Musculoskeletal pain   Pt with back pain. No cord involvement, no associated weakness, urinary incontinence, urinary retention, bowel incontinence, saddle anesthesia. Pt has a + straight leg on the right side. Xrays shows compression fx. Will give Neurosurgery f/u, to see if she can have some relief from her pain.  Polyuria, but no other UTI sx. Urine cultured.   Derwood Kaplan, MD 03/14/14 [phone]

## 2014-05-25 ENCOUNTER — Emergency Department (HOSPITAL_COMMUNITY): Payer: Medicare Other

## 2014-05-25 ENCOUNTER — Emergency Department (HOSPITAL_COMMUNITY)
Admission: EM | Admit: 2014-05-25 | Discharge: 2014-05-25 | Disposition: A | Discharge status: Home / Self Care | Payer: Medicare Other | Attending: Emergency Medicine | Admitting: Emergency Medicine

## 2014-05-25 ENCOUNTER — Encounter (HOSPITAL_COMMUNITY): Payer: Self-pay | Admitting: Emergency Medicine

## 2014-05-25 DIAGNOSIS — Z7982 Long term (current) use of aspirin: Secondary | ICD-10-CM | POA: Diagnosis not present

## 2014-05-25 DIAGNOSIS — Z7952 Long term (current) use of systemic steroids: Secondary | ICD-10-CM | POA: Diagnosis not present

## 2014-05-25 DIAGNOSIS — S79911A Unspecified injury of right hip, initial encounter: Secondary | ICD-10-CM | POA: Diagnosis not present

## 2014-05-25 DIAGNOSIS — S0031XA Abrasion of nose, initial encounter: Secondary | ICD-10-CM | POA: Diagnosis not present

## 2014-05-25 DIAGNOSIS — K219 Gastro-esophageal reflux disease without esophagitis: Secondary | ICD-10-CM | POA: Insufficient documentation

## 2014-05-25 DIAGNOSIS — Z8739 Personal history of other diseases of the musculoskeletal system and connective tissue: Secondary | ICD-10-CM | POA: Insufficient documentation

## 2014-05-25 DIAGNOSIS — Y929 Unspecified place or not applicable: Secondary | ICD-10-CM | POA: Diagnosis not present

## 2014-05-25 DIAGNOSIS — Z79899 Other long term (current) drug therapy: Secondary | ICD-10-CM | POA: Insufficient documentation

## 2014-05-25 DIAGNOSIS — Z880 Allergy status to penicillin: Secondary | ICD-10-CM | POA: Diagnosis not present

## 2014-05-25 DIAGNOSIS — I10 Essential (primary) hypertension: Secondary | ICD-10-CM | POA: Diagnosis not present

## 2014-05-25 DIAGNOSIS — S0990XA Unspecified injury of head, initial encounter: Secondary | ICD-10-CM | POA: Insufficient documentation

## 2014-05-25 DIAGNOSIS — L089 Local infection of the skin and subcutaneous tissue, unspecified: Secondary | ICD-10-CM | POA: Diagnosis not present

## 2014-05-25 DIAGNOSIS — Z8669 Personal history of other diseases of the nervous system and sense organs: Secondary | ICD-10-CM | POA: Diagnosis not present

## 2014-05-25 DIAGNOSIS — Y998 Other external cause status: Secondary | ICD-10-CM | POA: Insufficient documentation

## 2014-05-25 DIAGNOSIS — F329 Major depressive disorder, single episode, unspecified: Secondary | ICD-10-CM | POA: Diagnosis not present

## 2014-05-25 DIAGNOSIS — Y9389 Activity, other specified: Secondary | ICD-10-CM | POA: Insufficient documentation

## 2014-05-25 DIAGNOSIS — W19XXXA Unspecified fall, initial encounter: Secondary | ICD-10-CM

## 2014-05-25 MED ORDER — MUPIROCIN CALCIUM 2 % NA OINT: TOPICAL_OINTMENT | NASAL | Status: DC

## 2014-05-25 NOTE — ED Notes (Signed)
Continuing to await [facility].

## 2014-05-25 NOTE — ED Notes (Signed)
Pt reports chronic right side pain related to several orthopedic surgeries. Pt request evaluation to ensure "right hip isn't broken again" post fall. Upon assessment right leg is shortened but pt reports this is her normal post surgery.

## 2014-05-25 NOTE — ED Provider Notes (Signed)
CSN: [id number]     Arrival date & time 05/25/14  1612 History   First MD Initiated Contact with Patient 05/25/14 1727     Chief Complaint  Patient presents with  . Fall     (Consider location/radiation/quality/duration/timing/severity/associated sxs/prior Treatment) HPI   This is a 80 year old female resident of [facility] who had a mechanical fall from her wheelchair today. She states that her brake on her wheelchair has been giving her trouble today and as she was pulling herself the left side became stuck. The patient reached down to unlock it and fell forward onto her head and right hip. She complains of a slight headache but denies neck pain, changes in vision, unilateral weakness, difficulty with speech or swallowing. She has some right hip pain but is able to move it. She complains of recent sinus trouble and has a sore on her right and nose that is tender to palpation. She denies fevers, chills, myalgias or other signs or symptoms of systemic infection.  Past Medical History  Diagnosis Date  . GERD (gastroesophageal reflux disease)   . Hypertension   . Glaucoma   . Depression   . Lumbar spinal stenosis   . Right knee DJD   . Osteoporosis   . Vertigo    Past Surgical History  Procedure Laterality Date  . Fracture surgery    . Neck surgery    . Eye surgery    . Abdominal hysterectomy    . Hip fracture surgery Left   . Intramedullary (im) nail intertrochanteric Right 04/24/2013    Procedure: INTRAMEDULLARY (IM) NAIL INTERTROCHANTRIC;  Surgeon: Nadara Mustard, MD;  Location: [facility] OR;  Service: Orthopedics;  Laterality: Right;   No family history on file. History  Substance Use Topics  . Smoking status: Never Smoker   . Smokeless tobacco: Never Used  . Alcohol Use: No   OB History    No data available     Review of Systems  Ten systems reviewed and are negative for acute change, except as noted in the HPI.    Allergies  Macrolides and  ketolides; Penicillins; and Betadine  Home Medications   Prior to Admission medications   Medication Sig Start Date End Date Taking? Authorizing Provider  acetaminophen (TYLENOL) 500 MG tablet Take 500 mg by mouth 3 (three) times daily.    Yes Historical Provider, MD  Alpha-Lipoic Acid 600 MG CAPS Take 600 mg by mouth 2 (two) times daily.    Yes Historical Provider, MD  ALPRAZolam (XANAX) 0.25 MG tablet Take 0.25 mg by mouth 2 (two) times daily as needed for anxiety.   Yes Historical Provider, MD  amLODipine (NORVASC) 5 MG tablet Take 5 mg by mouth daily.   Yes Historical Provider, MD  aspirin EC 81 MG tablet Take 1 tablet (81 mg total) by mouth daily. 04/24/13  Yes Nadara Mustard, MD  bimatoprost (LUMIGAN) 0.01 % SOLN Place 1 drop into both eyes at bedtime.   Yes Historical Provider, MD  Cholecalciferol (VITAMIN D) 2000 UNITS CAPS Take 2,000 Units by mouth daily.   Yes Historical Provider, MD  DULoxetine (CYMBALTA) 60 MG capsule Take 60 mg by mouth daily.   Yes Historical Provider, MD  ferrous sulfate 325 (65 FE) MG tablet Take 325 mg by mouth daily with breakfast.   Yes Historical Provider, MD  fluticasone (FLONASE) 50 MCG/ACT nasal spray Place 1 spray into both nostrils 2 (two) times daily.   Yes Historical Provider, MD  loratadine (CLARITIN) 10 MG tablet Take 10 mg by mouth daily.   Yes Historical Provider, MD  loteprednol (LOTEMAX) 0.5 % ophthalmic suspension Place 1 drop into both eyes 2 (two) times daily.    Yes Historical Provider, MD  meclizine (ANTIVERT) 25 MG tablet Take 25 mg by mouth 2 (two) times daily as needed for dizziness.   Yes Historical Provider, MD  metoprolol succinate (TOPROL-XL) 25 MG 24 hr tablet Take 25 mg by mouth daily.   Yes Historical Provider, MD  Multiple Vitamins-Minerals (PRESERVISION AREDS 2) CAPS Take 1 capsule by mouth 2 (two) times daily.    Yes Historical Provider, MD  omeprazole (PRILOSEC) 20 MG capsule Take 20 mg by mouth daily. 6 am.   Yes Historical  Provider, MD  oxyCODONE (OXY IR/ROXICODONE) 5 MG immediate release tablet Take 5 mg by mouth every 4 (four) hours as needed for severe pain (pain).    Yes Historical Provider, MD  Oxycodone HCl 10 MG TABS Take 10 mg by mouth 2 (two) times daily.   Yes Historical Provider, MD  senna (SENOKOT) 8.6 MG TABS tablet Take 2 tablets by mouth 2 (two) times daily.    Yes Historical Provider, MD  timolol (BETIMOL) 0.25 % ophthalmic solution Place 1 drop into both eyes 2 (two) times daily.   Yes Historical Provider, MD  vitamin B-12 (CYANOCOBALAMIN) 1000 MCG tablet Take 1,000 mcg by mouth daily.   Yes Historical Provider, MD  cephALEXin (KEFLEX) 500 MG capsule Take 1 capsule (500 mg total) by mouth 2 (two) times daily. 03/14/14   Derwood Kaplan, MD  Docosanol (ABREVA) 10 % CREA Apply 1 application topically 4 (four) times daily as needed.    Historical Provider, MD  lidocaine (LIDODERM) 5 % Place 1 patch onto the skin daily. Remove & Discard patch within 12 hours or as directed by MD    Historical Provider, MD  menthol-cetylpyridinium (CEPACOL) 3 MG lozenge Take 1 lozenge by mouth every 4 (four) hours as needed for sore throat.    Historical Provider, MD  Menthol-Methyl Salicylate (MUSCLE RUB) 10-15 % CREA Apply 1 application topically daily as needed for muscle pain.    Historical Provider, MD  Polyethyl Glycol-Propyl Glycol (SYSTANE) 0.4-0.3 % SOLN Place 1 drop into both eyes 4 (four) times daily as needed.     Historical Provider, MD  polyethylene glycol (MIRALAX / GLYCOLAX) packet Take 17 g by mouth daily. Mix with 8 oz liquid and drink    Historical Provider, MD   BP 137/77 mmHg  Pulse 72  Temp(Src) 97.7 F (36.5 C) (Oral)  Resp 19  SpO2 95% Physical Exam  Constitutional: She is oriented to person, place, and time. She appears well-developed and well-nourished. No distress.  HENT:  Head: Normocephalic and atraumatic.    Eyes: Conjunctivae are normal. No scleral icterus.  Neck: Normal range of  motion.  Cardiovascular: Normal rate, regular rhythm and normal heart sounds.  Exam reveals no gallop and no friction rub.   No murmur heard. Pulmonary/Chest: Effort normal and breath sounds normal. No respiratory distress.  Abdominal: Soft. Bowel sounds are normal. She exhibits no distension and no mass. There is no tenderness. There is no guarding.  Neurological: She is alert and oriented to person, place, and time.  Skin: Skin is warm and dry. She is not diaphoretic.  Nursing note and vitals reviewed.   ED Course  Procedures (including critical care time) Labs Review Labs Reviewed - No data to display  Imaging Review Dg Hip  Complete Right  05/25/2014   CLINICAL DATA:  Chronic right-sided pain, fall.  EXAM: RIGHT HIP - COMPLETE 2+ VIEW  COMPARISON:  03/14/2014.  FINDINGS: Dynamic hip screw and IM rod fixation of the right femur with dynamic hip screw and lateral plate/screw fixation of the left femur. Findings are stable. No acute fracture. Old right superior and inferior pubic rami fractures are seen. Degenerative changes in the spine and sacroiliac joints. Osteopenia.  IMPRESSION: 1. Postoperative changes in both proximal femurs without superimposed acute fracture. 2. Old right superior and inferior pubic rami fractures. 3. Osteopenia.   Electronically Signed   By: Leanna BattlesMelinda  Blietz M.D.   On: 05/25/2014 17:51   Ct Head Wo Contrast  05/25/2014   CLINICAL DATA:  80 year old female status post fall with head pain  EXAM: CT HEAD WITHOUT CONTRAST  TECHNIQUE: Contiguous axial images were obtained from the base of the skull through the vertex without intravenous contrast.  COMPARISON:  Prior head CT 04/26/2013  FINDINGS: Negative for acute intracranial hemorrhage, acute infarction, mass, mass effect, hydrocephalus or midline shift. Gray-white differentiation is preserved throughout. No focal soft tissue or calvarial abnormality. Very mild atrophy for age. Mild chronic microvascular ischemic white  matter disease. Globes and orbits are intact and unremarkable bilaterally. Small air-fluid level in the sphenoid sinus. Normal aeration of the mastoid air cells and the remainder of the visualized paranasal sinuses. Atherosclerotic calcifications present in both cavernous carotid arteries.  IMPRESSION: 1. No acute intracranial abnormality. 2. Small air-fluid level in the sphenoid sinus may reflect underlying inflammatory paranasal sinus disease. 3. Intracranial atherosclerosis. 4. Mild atrophy and chronic microvascular ischemic white matter disease.   Electronically Signed   By: Malachy MoanHeath  McCullough M.D.   On: 05/25/2014 17:14     EKG Interpretation None      MDM   Final diagnoses:  Fall  Skin infection    Elderly patient with fall at nursing home. At baseline mental status. No signs of injury on PE. No blood thinners. Negative labs and imaging. Appears safe to return to SNF Facial infection appears consistent with impetigo. Will treat with topical mupirocin. F/U with PCP   Arthor CaptainAbigail Londen Lorge, PA-C 05/29/14 2204  Purvis SheffieldForrest Harrison, MD 06/03/14 1249

## 2014-05-25 NOTE — ED Notes (Addendum)
Per EMS pt from [facility] with unwitnessed fall from wheelchair. Pt was attempting to get back in bed and leaned too far forward. Pt has redness to right side of face but pt reports caused by chronic sinusitis. Pt complaint of right side headache for 3 days and chronic body aches from arthritis and orthopedic surgeries. Pt denies complaint from fall. Pt unsure if hit head during fall but denies LOC.

## 2014-05-25 NOTE — ED Notes (Signed)
[facility] here to transport patient 

## 2014-05-25 NOTE — ED Notes (Signed)
Bed: JS28 Expected date:  Expected time:  Means of arrival:  Comments: EMS-h/a

## 2014-05-25 NOTE — Discharge Instructions (Signed)
Fall Prevention and Home Safety Falls cause injuries and can affect all age groups. It is possible to use preventive measures to significantly decrease the likelihood of falls. There are many simple measures which can make your home safer and prevent falls. OUTDOORS  Repair cracks and edges of walkways and driveways.  Remove high doorway thresholds.  Trim shrubbery on the main path into your home.  Have good outside lighting.  Clear walkways of tools, rocks, debris, and clutter.  Check that handrails are not broken and are securely fastened. Both sides of steps should have handrails.  Have leaves, snow, and ice cleared regularly.  Use sand or salt on walkways during winter months.  In the garage, clean up grease or oil spills. BATHROOM  Install night lights.  Install grab bars by the toilet and in the tub and shower.  Use non-skid mats or decals in the tub or shower.  Place a plastic non-slip stool in the shower to sit on, if needed.  Keep floors dry and clean up all water on the floor immediately.  Remove soap buildup in the tub or shower on a regular basis.  Secure bath mats with non-slip, double-sided rug tape.  Remove throw rugs and tripping hazards from the floors. BEDROOMS  Install night lights.  Make sure a bedside light is easy to reach.  Do not use oversized bedding.  Keep a telephone by your bedside.  Have a firm chair with side arms to use for getting dressed.  Remove throw rugs and tripping hazards from the floor. KITCHEN  Keep handles on pots and pans turned toward the center of the stove. Use back burners when possible.  Clean up spills quickly and allow time for drying.  Avoid walking on wet floors.  Avoid hot utensils and knives.  Position shelves so they are not too high or low.  Place commonly used objects within easy reach.  If necessary, use a sturdy step stool with a grab bar when reaching.  Keep electrical cables out of the  way.  Do not use floor polish or wax that makes floors slippery. If you must use wax, use non-skid floor wax.  Remove throw rugs and tripping hazards from the floor. STAIRWAYS  Never leave objects on stairs.  Place handrails on both sides of stairways and use them. Fix any loose handrails. Make sure handrails on both sides of the stairways are as long as the stairs.  Check carpeting to make sure it is firmly attached along stairs. Make repairs to worn or loose carpet promptly.  Avoid placing throw rugs at the top or bottom of stairways, or properly secure the rug with carpet tape to prevent slippage. Get rid of throw rugs, if possible.  Have an electrician put in a light switch at the top and bottom of the stairs. OTHER FALL PREVENTION TIPS  Wear low-heel or rubber-soled shoes that are supportive and fit well. Wear closed toe shoes.  When using a stepladder, make sure it is fully opened and both spreaders are firmly locked. Do not climb a closed stepladder.  Add color or contrast paint or tape to grab bars and handrails in your home. Place contrasting color strips on first and last steps.  Learn and use mobility aids as needed. Install an electrical emergency response system.  Turn on lights to avoid dark areas. Replace light bulbs that burn out immediately. Get light switches that glow.  Arrange furniture to create clear pathways. Keep furniture in the same place.  Firmly attach carpet with non-skid or double-sided tape.  Eliminate uneven floor surfaces.  Select a carpet pattern that does not visually hide the edge of steps.  Be aware of all pets. OTHER HOME SAFETY TIPS  Set the water temperature for 120 F (48.8 C).  Keep emergency numbers on or near the telephone.  Keep smoke detectors on every level of the home and near sleeping areas. Document Released: 06/15/2002 Document Revised: 12/25/2011 Document Reviewed: 09/14/2011 [facility] Patient Information 2015  [place], [place]. This information is not intended to replace advice given to you by your health care provider. Make sure you discuss any questions you have with your health care provider.  Facial Infection You have an infection of your face. This requires special attention to help prevent serious problems. Infections in facial wounds can cause poor healing and scars. They can also spread to deeper tissues, especially around the eye. Wound and dental infections can lead to sinusitis, infection of the eye socket, and even meningitis. Permanent damage to the skin, eye, and nervous system may result if facial infections are not treated properly. With severe infections, hospital care for IV antibiotic injections may be needed if they don't respond to oral antibiotics. Antibiotics must be taken for the full course to insure the infection is eliminated. If the infection came from a bad tooth, it may have to be extracted when the infection is under control. Warm compresses may be applied to reduce skin irritation and remove drainage. You might need a tetanus shot now if:  You cannot remember when your last tetanus shot was.  You have never had a tetanus shot.  The object that caused your wound was dirty. If you need a tetanus shot, and you decide not to get one, there is a rare chance of getting tetanus. Sickness from tetanus can be serious. If you got a tetanus shot, your arm may swell, get red and warm to the touch at the shot site. This is common and not a problem. SEEK IMMEDIATE MEDICAL CARE IF:   You have increased swelling, redness, or trouble breathing.  You have a severe headache, dizziness, nausea, or vomiting.  You develop problems with your eyesight.  You have a fever. Document Released: 08/02/2004 Document Revised: 09/17/2011 Document Reviewed: 06/25/2005 [facility] Patient Information 2015 [place], [place]. This information is not intended to replace advice given to you by your health care  provider. Make sure you discuss any questions you have with your health care provider.

## 2016-08-12 ENCOUNTER — Emergency Department (HOSPITAL_COMMUNITY): Payer: Medicare Other

## 2016-08-12 ENCOUNTER — Emergency Department (HOSPITAL_COMMUNITY)
Admission: EM | Admit: 2016-08-12 | Discharge: 2016-08-12 | Disposition: A | Discharge status: Home / Self Care | Payer: Medicare Other | Attending: Emergency Medicine | Admitting: Emergency Medicine

## 2016-08-12 ENCOUNTER — Encounter (HOSPITAL_COMMUNITY): Payer: Self-pay | Admitting: Emergency Medicine

## 2016-08-12 DIAGNOSIS — Z7982 Long term (current) use of aspirin: Secondary | ICD-10-CM | POA: Insufficient documentation

## 2016-08-12 DIAGNOSIS — M1991 Primary osteoarthritis, unspecified site: Secondary | ICD-10-CM

## 2016-08-12 DIAGNOSIS — G8929 Other chronic pain: Secondary | ICD-10-CM

## 2016-08-12 DIAGNOSIS — M1711 Unilateral primary osteoarthritis, right knee: Secondary | ICD-10-CM | POA: Insufficient documentation

## 2016-08-12 DIAGNOSIS — I10 Essential (primary) hypertension: Secondary | ICD-10-CM | POA: Insufficient documentation

## 2016-08-12 DIAGNOSIS — L509 Urticaria, unspecified: Secondary | ICD-10-CM | POA: Diagnosis present

## 2016-08-12 DIAGNOSIS — Z79899 Other long term (current) drug therapy: Secondary | ICD-10-CM | POA: Insufficient documentation

## 2016-08-12 DIAGNOSIS — B370 Candidal stomatitis: Secondary | ICD-10-CM | POA: Diagnosis not present

## 2016-08-12 DIAGNOSIS — T450X5A Adverse effect of antiallergic and antiemetic drugs, initial encounter: Secondary | ICD-10-CM | POA: Insufficient documentation

## 2016-08-12 DIAGNOSIS — T7840XA Allergy, unspecified, initial encounter: Secondary | ICD-10-CM

## 2016-08-12 MED ORDER — DIPHENHYDRAMINE HCL 25 MG PO CAPS: 25.0000 mg | ORAL_CAPSULE | Freq: Once | ORAL | Status: DC

## 2016-08-12 MED ORDER — ALPRAZOLAM 0.25 MG PO TABS
0.2500 mg | ORAL_TABLET | Freq: Once | ORAL | Status: AC
  Administered 2016-08-12: 0.25 mg via ORAL
  Filled 2016-08-12: qty 1

## 2016-08-12 MED ORDER — OXYCODONE-ACETAMINOPHEN 5-325 MG PO TABS
1.0000 | ORAL_TABLET | Freq: Once | ORAL | Status: AC
  Administered 2016-08-12: 1 via ORAL
  Filled 2016-08-12: qty 1

## 2016-08-12 MED ORDER — FAMOTIDINE 20 MG PO TABS: 20.0000 mg | ORAL_TABLET | Freq: Once | ORAL | Status: DC

## 2016-08-12 MED ORDER — FLUCONAZOLE 200 MG PO TABS: 200.0000 mg | ORAL_TABLET | Freq: Every day | ORAL | 0 refills | Status: AC

## 2016-08-12 NOTE — ED Notes (Signed)
Lauren at [facility] contacted to give report and then states to contact [facility] for transport as Son in Social worker states he does not feel he can safely transport pt. Same done.

## 2016-08-12 NOTE — ED Provider Notes (Signed)
[facility]-EMERGENCY DEPT Provider Note   CSN: [id number] Arrival date & time: 08/12/16  8119     History   Chief Complaint Chief Complaint  Patient presents with  . Allergic Reaction    HPI Yvette Carroll is a 83 y.o. female.  Pt presents to the ED today with hives and mouth swelling.  The pt received 50 mg of benadryl and 125 mg solu-medrol by EMS.  The pt c/o cramping to her right leg which she gets frequently.  She normally takes oxycodone for the pain.  The pt has not had her morning home meds.  Her family member said that she has recently started nystatin for oral thrush.      Past Medical History:  Diagnosis Date  . Depression   . GERD (gastroesophageal reflux disease)   . Glaucoma   . Hypertension   . Lumbar spinal stenosis   . Osteoporosis   . Right knee DJD   . Vertigo     Patient Active Problem List   Diagnosis Date Noted  . Acute posthemorrhagic anemia 05/27/2013  . Chronic low back pain 04/30/2013  . Glaucoma 04/30/2013  . Allergic rhinitis 04/30/2013  . GERD (gastroesophageal reflux disease) 04/30/2013  . CAD (coronary artery disease) 04/30/2013  . Constipation 04/30/2013  . Hip fracture (HCC) 04/24/2013  . HTN (hypertension) 04/24/2013  . Anemia 04/24/2013    Past Surgical History:  Procedure Laterality Date  . ABDOMINAL HYSTERECTOMY    . EYE SURGERY    . FRACTURE SURGERY    . HIP FRACTURE SURGERY Left   . INTRAMEDULLARY (IM) NAIL INTERTROCHANTERIC Right 04/24/2013   Procedure: INTRAMEDULLARY (IM) NAIL INTERTROCHANTRIC;  Surgeon: Nadara Mustard, MD;  Location: [facility] OR;  Service: Orthopedics;  Laterality: Right;  . NECK SURGERY      OB History    No data available       Home Medications    Prior to Admission medications   Medication Sig Start Date End Date Taking? Authorizing Provider  acetaminophen (TYLENOL) 500 MG tablet Take 500 mg by mouth 3 (three) times daily as needed for moderate pain.    Yes Historical Provider, MD  ALPRAZolam (XANAX)  0.25 MG tablet Take 0.125-0.25 mg by mouth See admin instructions. Take 0.125 mg by mouth at bedtime. Take 0.25 mg by mouth daily as needed for anxiety/agitation   Yes Historical Provider, MD  antiseptic oral rinse (BIOTENE) LIQD 15 mLs by Mouth Rinse route 3 (three) times daily. May self administer   Yes Historical Provider, MD  bimatoprost (LUMIGAN) 0.01 % SOLN Place 1 drop into both eyes every evening.    Yes Historical Provider, MD  calcium citrate (CALCITRATE - DOSED IN MG ELEMENTAL CALCIUM) 950 MG tablet Take 200 mg of elemental calcium by mouth every morning.   Yes Historical Provider, MD  cholecalciferol (VITAMIN D) 1000 units tablet Take 1,000 Units by mouth daily.    Yes Historical Provider, MD  diphenhydrAMINE-zinc acetate (BANOPHEN) cream Apply 1 application topically See admin instructions. Apply topically to chin every day   Yes Historical Provider, MD  Docosanol (ABREVA) 10 % CREA Apply 1 application topically 4 (four) times daily as needed (for fever blisters).    Yes Historical Provider, MD  DULoxetine (CYMBALTA) 60 MG capsule Take 60 mg by mouth daily.   Yes Historical Provider, MD  ENSURE PLUS (ENSURE PLUS) LIQD Take 237 mLs by mouth 2 (two) times daily.   Yes Historical Provider, MD  fluticasone (FLONASE) 50 MCG/ACT nasal spray Place  1 spray into both nostrils daily.    Yes Historical Provider, MD  furosemide (LASIX) 20 MG tablet Take 20 mg by mouth daily.   Yes Historical Provider, MD  guaiFENesin (MUCINEX) 600 MG 12 hr tablet Take 600 mg by mouth 2 (two) times daily as needed (for congestion / excessive mucus).   Yes Historical Provider, MD  lidocaine (LIDODERM) 5 % Place 1 patch onto the skin daily as needed (for knee, hip and back pain). Remove & Discard patch within 12 hours or as directed by MD   Yes Historical Provider, MD  lisinopril (PRINIVIL,ZESTRIL) 2.5 MG tablet Take 3.75 mg by mouth daily.   Yes Historical Provider, MD  loratadine (CLARITIN) 10 MG tablet Take 10 mg by  mouth daily as needed (for runny nose or sinus symptoms).    Yes Historical Provider, MD  loteprednol (LOTEMAX) 0.5 % ophthalmic suspension Place 1 drop into both eyes 2 (two) times daily.    Yes Historical Provider, MD  Menthol-Methyl Salicylate (MUSCLE RUB) 10-15 % CREA Apply 1 application topically daily as needed for muscle pain.   Yes Historical Provider, MD  metoprolol tartrate (LOPRESSOR) 25 MG tablet Take 12.5 mg by mouth 2 (two) times daily.   Yes Historical Provider, MD  mirtazapine (REMERON) 15 MG tablet Take 15 mg by mouth at bedtime.   Yes Historical Provider, MD  Multiple Vitamins-Minerals (PRESERVISION AREDS 2) CAPS Take 1 capsule by mouth 2 (two) times daily.    Yes Historical Provider, MD  OVER THE COUNTER MEDICATION Take 1 lozenge by mouth every 4 (four) hours as needed (for sore throat). Sore Throat Cherry 15MG -3.6MG  Lozenge   Yes Historical Provider, MD  Oxycodone HCl 10 MG TABS Take 10 mg by mouth 2 (two) times daily.   Yes Historical Provider, MD  Polyethyl Glycol-Propyl Glycol (SYSTANE) 0.4-0.3 % SOLN Place 1 drop into both eyes 4 (four) times daily as needed (for dry eyes).    Yes Historical Provider, MD  polyethylene glycol (MIRALAX / GLYCOLAX) packet Take 17 g by mouth daily.   Yes Historical Provider, MD  potassium chloride (K-DUR,KLOR-CON) 10 MEQ tablet Take 10 mEq by mouth daily. With Lasix   Yes Historical Provider, MD  psyllium (FIBER LAXATIVE) 0.52 g capsule Take 0.52 g by mouth at bedtime.   Yes Historical Provider, MD  QUEtiapine (SEROQUEL) 25 MG tablet Take 12.5 mg by mouth 2 (two) times daily.   Yes Historical Provider, MD  ranitidine (ZANTAC) 150 MG tablet Take 150 mg by mouth 2 (two) times daily.   Yes Historical Provider, MD  senna-docusate (SENEXON-S) 8.6-50 MG tablet Take 1 tablet by mouth at bedtime.   Yes Historical Provider, MD  timolol (BETIMOL) 0.25 % ophthalmic solution Place 2 drops into both eyes 2 (two) times daily.    Yes Historical Provider, MD    aspirin EC 81 MG tablet Take 1 tablet (81 mg total) by mouth daily. Patient not taking: Reported on 08/12/2016 04/24/13   Nadara Mustard, MD  cephALEXin (KEFLEX) 500 MG capsule Take 1 capsule (500 mg total) by mouth 2 (two) times daily. Patient not taking: Reported on 05/27/2014 03/14/14   Derwood Kaplan, MD  fluconazole (DIFLUCAN) 200 MG tablet Take 1 tablet (200 mg total) by mouth daily. 08/12/16 08/19/16  Jacalyn Lefevre, MD  mupirocin nasal ointment (BACTROBAN) 2 % Apply to the scab and inside the right side of the nose twice daily. Patient not taking: Reported on 08/12/2016 05/25/14   Arthor Captain, PA-C  Family History No family history on file.  Social History Social History  Substance Use Topics  . Smoking status: Never Smoker  . Smokeless tobacco: Never Used  . Alcohol use No     Allergies   Macrolides and ketolides; Penicillins; Risperdal [risperidone]; Shellfish allergy; and Betadine [povidone iodine]   Review of Systems Review of Systems  HENT:       Mouth swelling  Skin:       hives  All other systems reviewed and are negative.    Physical Exam Updated Vital Signs BP 134/64   Pulse 81   Temp 97.7 F (36.5 C) (Oral)   Resp 16   SpO2 91%   Physical Exam  Constitutional: She is oriented to person, place, and time. She appears well-developed and well-nourished.  HENT:  Head: Atraumatic.    Right Ear: External ear normal.  Left Ear: External ear normal.  Nose: Nose normal.  Mouth/Throat: Oropharynx is clear and moist.  Oral thrush  Eyes: Conjunctivae and EOM are normal. Pupils are equal, round, and reactive to light.  Neck: Normal range of motion. Neck supple.  Cardiovascular: Normal rate, regular rhythm, normal heart sounds and intact distal pulses.   Pulmonary/Chest: Effort normal and breath sounds normal.  Abdominal: Soft. Bowel sounds are normal.  Musculoskeletal: Normal range of motion.  Neurological: She is alert and oriented to person, place, and  time.  Skin: Skin is warm.  Psychiatric: She has a normal mood and affect. Her behavior is normal. Judgment and thought content normal.  Nursing note and vitals reviewed.    ED Treatments / Results  Labs (all labs ordered are listed, but only abnormal results are displayed) Labs Reviewed - No data to display  EKG  EKG Interpretation None       Radiology Dg Knee Complete 4 Views Left  Result Date: 08/12/2016 CLINICAL DATA:  Knee pain. EXAM: LEFT KNEE - COMPLETE 4+ VIEW COMPARISON:  None FINDINGS: Mild tricompartmental degenerative changes. No acute fracture. Chondrocalcinosis is noted. No joint effusion. Moderate arterial calcifications. IMPRESSION: No acute bony findings or joint effusion. Electronically Signed   By: Rudie Meyer M.D.   On: 08/12/2016 10:40   Dg Knee Complete 4 Views Right  Result Date: 08/12/2016 CLINICAL DATA:  Right knee pain. EXAM: RIGHT KNEE - COMPLETE 4+ VIEW COMPARISON:  None. FINDINGS: Moderate degenerative changes with joint space narrowing and osteophytic spurring. This is most significant in the lateral compartment. Chondrocalcinosis is also noted. No definite fracture or osteochondral lesion. Small joint effusion. Moderate arterial calcifications. IMPRESSION: Moderate tricompartmental degenerative changes, most significant in the lateral compartment. No definite acute fracture. Small joint effusion. Electronically Signed   By: Rudie Meyer M.D.   On: 08/12/2016 10:42   Dg Hip Unilat W Or Wo Pelvis 2-3 Views Right  Result Date: 08/12/2016 CLINICAL DATA:  Right hip and bilateral leg pain. EXAM: DG HIP (WITH OR WITHOUT PELVIS) 2-3V RIGHT COMPARISON:  Radiographs 05/25/2014 FINDINGS: Both hips are normally located. There is a gamma nail on the right side along with a dynamic hip screw. No complicating features are demonstrated. No acute fracture. There is a dynamic hip screw on the left side also with a lateral sideplate and multiple screws. No complicating  features. No acute fracture. No plain film findings for avascular necrosis. The pubic symphysis and SI joints are grossly intact. Remote healed right-sided pubic rami fractures are noted. No obvious acute pelvic fracture. IMPRESSION: Stable bilateral hip hardware without complicating features. No acute hip  or pelvic fracture is identified. Electronically Signed   By: Rudie Meyer M.D.   On: 08/12/2016 10:39    Procedures Procedures (including critical care time)  Medications Ordered in ED Medications  ALPRAZolam Prudy Feeler) tablet 0.25 mg (0.25 mg Oral Given 08/12/16 0726)  oxyCODONE-acetaminophen (PERCOCET/ROXICET) 5-325 MG per tablet 1 tablet (1 tablet Oral Given 08/12/16 0727)     Initial Impression / Assessment and Plan / ED Course  I have reviewed the triage vital signs and the nursing notes.  Pertinent labs & imaging results that were available during my care of the patient were reviewed by me and considered in my medical decision making (see chart for details).    Pt's family member concerned about her pain.  He requested x-rays since pt has fallen multiple times.  Pt is likely allergic to nystatin.  She will be taken off that and started on diflucan for her thrush.  Pain is controlled now with her routine meds.  Swelling has nearly resolved.   Final Clinical Impressions(s) / ED Diagnoses   Final diagnoses:  Other chronic pain  Primary osteoarthritis, unspecified site  Allergic reaction, initial encounter  Oral thrush    New Prescriptions New Prescriptions   FLUCONAZOLE (DIFLUCAN) 200 MG TABLET    Take 1 tablet (200 mg total) by mouth daily.     Jacalyn Lefevre, MD 08/12/16 [phone]

## 2016-08-12 NOTE — ED Notes (Signed)
Patient transported to X-ray 

## 2016-08-12 NOTE — ED Notes (Signed)
Pt c/o rash on back and right hip with none noted. Pt c/o rash at right shoulder. 2 dime size red areas noted at right shoulder. Pt c/o pain at right knee then left knee.

## 2016-08-12 NOTE — ED Notes (Signed)
Pt returns from xray

## 2016-08-12 NOTE — ED Triage Notes (Signed)
Pt presents from [facility] via [facility] for facial swelling, hives to the RIGHT hip/back, itching, and sore throat (normal for patient, hx of same); pt has allergies to iodine and shellfish; last meal yesterday at 6pm; staff reports no new meds; EMS gave 50mg  benadryl and 125mg  solumedrol; VSS per EMS

## 2016-08-12 NOTE — ED Notes (Addendum)
Pt talking with slightly slurred speech. Sleeping at intervals.Redness at chin decreased abd noi spots noted at right shoulder.

## 2016-08-12 NOTE — ED Notes (Addendum)
Pt repositioned and blocked up with pillow to protect left side from rail.

## 2016-08-15 ENCOUNTER — Emergency Department (HOSPITAL_COMMUNITY): Payer: Medicare Other

## 2016-08-15 ENCOUNTER — Encounter (HOSPITAL_COMMUNITY): Payer: Self-pay | Admitting: Emergency Medicine

## 2016-08-15 ENCOUNTER — Emergency Department (HOSPITAL_COMMUNITY)
Admission: EM | Admit: 2016-08-15 | Discharge: 2016-08-16 | Disposition: A | Discharge status: Home / Self Care | Payer: Medicare Other | Attending: Emergency Medicine | Admitting: Emergency Medicine

## 2016-08-15 DIAGNOSIS — R220 Localized swelling, mass and lump, head: Secondary | ICD-10-CM | POA: Diagnosis present

## 2016-08-15 DIAGNOSIS — T783XXA Angioneurotic edema, initial encounter: Secondary | ICD-10-CM | POA: Insufficient documentation

## 2016-08-15 DIAGNOSIS — N390 Urinary tract infection, site not specified: Secondary | ICD-10-CM | POA: Insufficient documentation

## 2016-08-15 DIAGNOSIS — I2510 Atherosclerotic heart disease of native coronary artery without angina pectoris: Secondary | ICD-10-CM | POA: Insufficient documentation

## 2016-08-15 DIAGNOSIS — Z79899 Other long term (current) drug therapy: Secondary | ICD-10-CM | POA: Insufficient documentation

## 2016-08-15 DIAGNOSIS — R0689 Other abnormalities of breathing: Secondary | ICD-10-CM | POA: Diagnosis not present

## 2016-08-15 DIAGNOSIS — Z7982 Long term (current) use of aspirin: Secondary | ICD-10-CM | POA: Diagnosis not present

## 2016-08-15 DIAGNOSIS — I10 Essential (primary) hypertension: Secondary | ICD-10-CM | POA: Insufficient documentation

## 2016-08-15 MED ORDER — METHYLPREDNISOLONE SODIUM SUCC 125 MG IJ SOLR
125.0000 mg | Freq: Once | INTRAMUSCULAR | Status: AC
  Administered 2016-08-15: 125 mg via INTRAVENOUS
  Filled 2016-08-15: qty 2

## 2016-08-15 MED ORDER — DIPHENHYDRAMINE HCL 50 MG/ML IJ SOLN
25.0000 mg | Freq: Once | INTRAMUSCULAR | Status: AC
  Administered 2016-08-15: 25 mg via INTRAVENOUS
  Filled 2016-08-15: qty 1

## 2016-08-15 MED ORDER — FAMOTIDINE IN NACL 20-0.9 MG/50ML-% IV SOLN
20.0000 mg | Freq: Once | INTRAVENOUS | Status: AC
  Administered 2016-08-15: 20 mg via INTRAVENOUS
  Filled 2016-08-15: qty 50

## 2016-08-15 NOTE — ED Triage Notes (Signed)
Pt presents from  [facility] via EMS. Complaint of tongue swelling x 3 days.  Pt is altered at baseline.   Airway intact.  Lung sounds clear.

## 2016-08-15 NOTE — ED Provider Notes (Signed)
[facility]-EMERGENCY DEPT Provider Note   CSN: [id number] Arrival date & time: 08/15/16  2304  By signing my name below, I, Freida Busman, attest that this documentation has been prepared under the direction and in the presence of Glynn Octave, MD . Electronically Signed: Freida Busman, Scribe. 08/15/2016. 11:27 PM.  History   Chief Complaint Chief Complaint  Patient presents with  . Oral Swelling    LEVEL 5 CAVEAT DUE TO DEMENTIA   The history is provided by the patient, medical records and the EMS personnel. No language interpreter was used.    HPI Comments:  Yvette Carroll is a 83 y.o. female who presents to the Emergency Department via EMS complaining of swelling to the tongue. Pt  was seen in the ED on 08/12/2016 for an allergic reaction. At that time she presented with hives to her back and mouth swelling and prior to that visit had been started on nystatin for thrush. She was thought to be having an allergic reaction to the nystatin and was changed to diflucan. Pt states today her tongue has not changed much since that visit. She reports occasional oral pain. Pt states she is unable to eat because she is afraid of choking.   Past Medical History:  Diagnosis Date  . Depression   . GERD (gastroesophageal reflux disease)   . Glaucoma   . Hypertension   . Lumbar spinal stenosis   . Osteoporosis   . Right knee DJD   . Vertigo     Patient Active Problem List   Diagnosis Date Noted  . Acute posthemorrhagic anemia 05/27/2013  . Chronic low back pain 04/30/2013  . Glaucoma 04/30/2013  . Allergic rhinitis 04/30/2013  . GERD (gastroesophageal reflux disease) 04/30/2013  . CAD (coronary artery disease) 04/30/2013  . Constipation 04/30/2013  . Hip fracture (HCC) 04/24/2013  . HTN (hypertension) 04/24/2013  . Anemia 04/24/2013    Past Surgical History:  Procedure Laterality Date  . ABDOMINAL HYSTERECTOMY    . EYE SURGERY    . FRACTURE SURGERY    . HIP FRACTURE SURGERY Left   .  INTRAMEDULLARY (IM) NAIL INTERTROCHANTERIC Right 04/24/2013   Procedure: INTRAMEDULLARY (IM) NAIL INTERTROCHANTRIC;  Surgeon: Nadara Mustard, MD;  Location: [facility] OR;  Service: Orthopedics;  Laterality: Right;  . NECK SURGERY      OB History    No data available       Home Medications    Prior to Admission medications   Medication Sig Start Date End Date Taking? Authorizing Provider  acetaminophen (TYLENOL) 500 MG tablet Take 500 mg by mouth 3 (three) times daily as needed for moderate pain.    Yes Historical Provider, MD  ALPRAZolam (XANAX) 0.25 MG tablet Take 0.125 mg by mouth at bedtime.    Yes Historical Provider, MD  ALPRAZolam Prudy Feeler) 0.25 MG tablet Take 0.125 mg by mouth daily as needed for anxiety.   Yes Historical Provider, MD  antiseptic oral rinse (BIOTENE) LIQD 15 mLs by Mouth Rinse route 3 (three) times daily. May self administer   Yes Historical Provider, MD  bimatoprost (LUMIGAN) 0.01 % SOLN Place 1 drop into both eyes every evening.    Yes Historical Provider, MD  calcium citrate (CALCITRATE - DOSED IN MG ELEMENTAL CALCIUM) 950 MG tablet Take 200 mg of elemental calcium by mouth daily.    Yes Historical Provider, MD  cholecalciferol (VITAMIN D) 1000 units tablet Take 1,000 Units by mouth daily.    Yes Historical Provider, MD  diphenhydrAMINE-zinc acetate (  BANOPHEN) cream Apply 1 application topically See admin instructions. Apply topically to chin every day   Yes Historical Provider, MD  Docosanol (ABREVA) 10 % CREA Apply 1 application topically 4 (four) times daily as needed (for fever blisters).    Yes Historical Provider, MD  DULoxetine (CYMBALTA) 60 MG capsule Take 60 mg by mouth daily.   Yes Historical Provider, MD  ENSURE PLUS (ENSURE PLUS) LIQD Take 237 mLs by mouth 2 (two) times daily.   Yes Historical Provider, MD  fluconazole (DIFLUCAN) 200 MG tablet Take 1 tablet (200 mg total) by mouth daily. 08/12/16 08/19/16 Yes Jacalyn Lefevre, MD  fluticasone (FLONASE) 50 MCG/ACT  nasal spray Place 1 spray into both nostrils daily.    Yes Historical Provider, MD  furosemide (LASIX) 20 MG tablet Take 20 mg by mouth daily.   Yes Historical Provider, MD  guaiFENesin (MUCINEX) 600 MG 12 hr tablet Take 600 mg by mouth 2 (two) times daily as needed (for congestion / excessive mucus).   Yes Historical Provider, MD  lidocaine (LIDODERM) 5 % Place 1 patch onto the skin daily as needed (for knee, hip and back pain). Remove & Discard patch within 12 hours or as directed by MD   Yes Historical Provider, MD  lisinopril (PRINIVIL,ZESTRIL) 2.5 MG tablet Take 3.75 mg by mouth daily.   Yes Historical Provider, MD  loratadine (CLARITIN) 10 MG tablet Take 10 mg by mouth daily as needed (for runny nose or sinus symptoms).    Yes Historical Provider, MD  loteprednol (LOTEMAX) 0.5 % ophthalmic suspension Place 1 drop into both eyes 2 (two) times daily.    Yes Historical Provider, MD  Menthol-Methyl Salicylate (MUSCLE RUB) 10-15 % CREA Apply 1 application topically daily as needed for muscle pain.   Yes Historical Provider, MD  metoprolol tartrate (LOPRESSOR) 25 MG tablet Take 12.5 mg by mouth 2 (two) times daily.   Yes Historical Provider, MD  mirtazapine (REMERON) 15 MG tablet Take 15 mg by mouth at bedtime.   Yes Historical Provider, MD  Multiple Vitamins-Minerals (PRESERVISION AREDS 2) CAPS Take 1 capsule by mouth 2 (two) times daily.    Yes Historical Provider, MD  OVER THE COUNTER MEDICATION Take 1 lozenge by mouth every 4 (four) hours as needed (for sore throat). Sore Throat Cherry 15MG -3.6MG  Lozenge   Yes Historical Provider, MD  Oxycodone HCl 10 MG TABS Take 10 mg by mouth 2 (two) times daily.   Yes Historical Provider, MD  Polyethyl Glycol-Propyl Glycol (SYSTANE) 0.4-0.3 % SOLN Place 1 drop into both eyes 4 (four) times daily as needed (for dry eyes).    Yes Historical Provider, MD  polyethylene glycol (MIRALAX / GLYCOLAX) packet Take 17 g by mouth daily.   Yes Historical Provider, MD    potassium chloride (K-DUR,KLOR-CON) 10 MEQ tablet Take 10 mEq by mouth daily. With Lasix   Yes Historical Provider, MD  psyllium (FIBER LAXATIVE) 0.52 g capsule Take 0.52 g by mouth at bedtime.   Yes Historical Provider, MD  QUEtiapine (SEROQUEL) 25 MG tablet Take 12.5 mg by mouth 2 (two) times daily.   Yes Historical Provider, MD  ranitidine (ZANTAC) 150 MG tablet Take 150 mg by mouth 2 (two) times daily.   Yes Historical Provider, MD  senna-docusate (SENEXON-S) 8.6-50 MG tablet Take 1 tablet by mouth at bedtime.   Yes Historical Provider, MD  timolol (BETIMOL) 0.25 % ophthalmic solution Place 2 drops into both eyes 2 (two) times daily.    Yes Historical Provider, MD  aspirin EC 81 MG tablet Take 1 tablet (81 mg total) by mouth daily. Patient not taking: Reported on 08/12/2016 04/24/13   Nadara Mustard, MD  cephALEXin (KEFLEX) 500 MG capsule Take 1 capsule (500 mg total) by mouth 2 (two) times daily. Patient not taking: Reported on 05/27/2014 03/14/14   Derwood Kaplan, MD  mupirocin nasal ointment (BACTROBAN) 2 % Apply to the scab and inside the right side of the nose twice daily. Patient not taking: Reported on 08/12/2016 05/25/14   Arthor Captain, PA-C    Family History No family history on file.  Social History Social History  Substance Use Topics  . Smoking status: Never Smoker  . Smokeless tobacco: Never Used  . Alcohol use No     Allergies   Iodine; Macrolides and ketolides; Penicillins; Risperdal [risperidone]; Shellfish allergy; and Betadine [povidone iodine]   Review of Systems Review of Systems  Unable to perform ROS: Dementia   Physical Exam Updated Vital Signs BP 145/90 (BP Location: Right Arm)   Pulse 67   Temp 97.4 F (36.3 C) (Oral)   Resp 18   Ht 5' (1.524 m)   SpO2 97%   Physical Exam  Constitutional: She appears well-developed and well-nourished. No distress.  HENT:  Head: Normocephalic and atraumatic.  Mouth/Throat: Mucous membranes are dry. No  oropharyngeal exudate.  Dry tongue; mild diffuse swelling No posterior oral swelling; no lip swelling Floor of mouth is soft   Eyes: Conjunctivae and EOM are normal. Pupils are equal, round, and reactive to light.  Neck: Normal range of motion. Neck supple.  No meningismus.  Cardiovascular: Normal rate, regular rhythm, normal heart sounds and intact distal pulses.   No murmur heard. Pulmonary/Chest: Effort normal and breath sounds normal. No respiratory distress. She has no wheezes. She has no rales.  Abdominal: Soft. There is no tenderness. There is no rebound and no guarding.  Musculoskeletal: Normal range of motion. She exhibits no edema or tenderness.  Neurological: She is alert. No cranial nerve deficit. She exhibits normal muscle tone. Coordination normal.  Oriented x 2  Moves all extremities    Skin: Skin is warm.  Linear abrasion/excoriation to mid back   Psychiatric: She has a normal mood and affect. Her behavior is normal.  Nursing note and vitals reviewed.    ED Treatments / Results  DIAGNOSTIC STUDIES:  Oxygen Saturation is 97% on RA, normal by my interpretation.    Labs (all labs ordered are listed, but only abnormal results are displayed) Labs Reviewed  COMPREHENSIVE METABOLIC PANEL - Abnormal; Notable for the following:       Result Value   BUN 28 (*)    Calcium 8.8 (*)    Total Protein 6.2 (*)    ALT 11 (*)    GFR calc non Af Amer 52 (*)    GFR calc Af Amer 60 (*)    All other components within normal limits  URINALYSIS, ROUTINE W REFLEX MICROSCOPIC - Abnormal; Notable for the following:    Leukocytes, UA MODERATE (*)    Bacteria, UA RARE (*)    Squamous Epithelial / LPF 0-5 (*)    All other components within normal limits  CBC WITH DIFFERENTIAL/PLATELET  BRAIN NATRIURETIC PEPTIDE    EKG  EKG Interpretation  Date/Time:  Thursday August 16 2016 00:03:58 EST Ventricular Rate:  62 PR Interval:    QRS Duration: 94 QT Interval:  454 QTC  Calculation: 462 R Axis:   25 Text Interpretation:  Sinus rhythm Minimal  ST elevation, inferior leads No significant change was found Confirmed by Manus Gunning  MD, Chaelyn Bunyan [phone]) on 08/16/2016 12:29:38 AM       Radiology Dg Chest 2 View  Result Date: 08/16/2016 CLINICAL DATA:  Tongue swelling EXAM: CHEST  2 VIEW COMPARISON:  04/24/2011 FINDINGS: The heart size and mediastinal contours are within normal limits. Stable aortic atherosclerosis without aneurysm. Mild uncoiling of the thoracic aorta. ACDF of the visualized lower cervical spine. Both lungs are clear. Osteoarthritis of the [facility] and glenohumeral joints bilaterally with subcoracoid loose bodies noted on the left. IMPRESSION: No active cardiopulmonary disease. Electronically Signed   By: Tollie Eth M.D.   On: 08/16/2016 00:10    Procedures Procedures (including critical care time)  Medications Ordered in ED Medications  sodium chloride 0.9 % bolus 500 mL (not administered)  methylPREDNISolone sodium succinate (SOLU-MEDROL) 125 mg/2 mL injection 125 mg (125 mg Intravenous Given 08/15/16 2331)  famotidine (PEPCID) IVPB 20 mg premix (20 mg Intravenous New Bag/Given 08/15/16 2330)  diphenhydrAMINE (BENADRYL) injection 25 mg (25 mg Intravenous Given 08/15/16 2331)     Initial Impression / Assessment and Plan / ED Course  I have reviewed the triage vital signs and the nursing notes.  Pertinent labs & imaging results that were available during my care of the patient were reviewed by me and considered in my medical decision making (see chart for details).     Sent from nursing home with progressive tongue swelling over the past 3 days. Recently seen and treated for suspected allergic reaction to fluconazole.  Tolerating secretions.  No SOB or CP. Tongue dry. She is on an ace inhibitor. She takes Lisinopril 2.5.   Patient observed in ED for several hours. Her tongue swelling has improved. She is tolerating liquids without a problem. She denies  any difficulty breathing or swallowing.  Lisinopril added to her allergy list and nursing home will be informed that she can no longer take this medication. We'll treat questionably UTI. Culture sent. Patient given IV fluids in the ED for mucous membranes that her lab work is reassuring. Vitals are stable.  Patient is tolerating by mouth. There is no lip swelling. Her tongue swelling is minimal. There is no posterior pharyngeal swelling. instructed patient she can never take lisinopril again. Return precautions discussed including worsening difficulty breathing, difficulty swallowing, chest pain, shortness of breath or other concerns.  CRITICAL CARE Performed by: Glynn Octave Total critical care time: 35 minutes Critical care time was exclusive of separately billable procedures and treating other patients. Critical care was necessary to treat or prevent imminent or life-threatening deterioration. Critical care was time spent personally by me on the following activities: development of treatment plan with patient and/or surrogate as well as nursing, discussions with consultants, evaluation of patient's response to treatment, examination of patient, obtaining history from patient or surrogate, ordering and performing treatments and interventions, ordering and review of laboratory studies, ordering and review of radiographic studies, pulse oximetry and re-evaluation of patient's condition.    Final Clinical Impressions(s) / ED Diagnoses   Final diagnoses:  Angioedema, initial encounter  Urinary tract infection without hematuria, site unspecified    New Prescriptions New Prescriptions   No medications on file   I personally performed the services described in this documentation, which was scribed in my presence. The recorded information has been reviewed and is accurate.     Glynn Octave, MD 08/16/16 [phone]

## 2016-08-16 DIAGNOSIS — T783XXA Angioneurotic edema, initial encounter: Secondary | ICD-10-CM | POA: Diagnosis not present

## 2016-08-16 LAB — COMPREHENSIVE METABOLIC PANEL
ALT: 11 U/L — AB (ref 14–54)
AST: 21 U/L (ref 15–41)
Albumin: 3.5 g/dL (ref 3.5–5.0)
Alkaline Phosphatase: 93 U/L (ref 38–126)
Anion gap: 12 (ref 5–15)
BUN: 28 mg/dL — ABNORMAL HIGH (ref 6–20)
CHLORIDE: 101 mmol/L (ref 101–111)
CO2: 26 mmol/L (ref 22–32)
CREATININE: 0.92 mg/dL (ref 0.44–1.00)
Calcium: 8.8 mg/dL — ABNORMAL LOW (ref 8.9–10.3)
GFR calc non Af Amer: 52 mL/min — ABNORMAL LOW (ref >60)
GFR, EST AFRICAN AMERICAN: 60 mL/min — AB (ref >60)
Glucose, Bld: 98 mg/dL (ref 65–99)
Potassium: 4.5 mmol/L (ref 3.5–5.1)
Sodium: 139 mmol/L (ref 135–145)
Total Bilirubin: 0.5 mg/dL (ref 0.3–1.2)
Total Protein: 6.2 g/dL — ABNORMAL LOW (ref 6.5–8.1)

## 2016-08-16 LAB — CBC WITH DIFFERENTIAL/PLATELET
BASOS ABS: 0 10*3/uL (ref 0.0–0.1)
Basophils Relative: 0 %
EOS PCT: 3 %
Eosinophils Absolute: 0.2 10*3/uL (ref 0.0–0.7)
HCT: 38.2 % (ref 36.0–46.0)
Hemoglobin: 12 g/dL (ref 12.0–15.0)
Lymphocytes Relative: 19 %
Lymphs Abs: 1 10*3/uL (ref 0.7–4.0)
MCH: 30.8 pg (ref 26.0–34.0)
MCHC: 31.4 g/dL (ref 30.0–36.0)
MCV: 97.9 fL (ref 78.0–100.0)
Monocytes Absolute: 0.2 10*3/uL (ref 0.1–1.0)
Monocytes Relative: 3 %
Neutro Abs: 4.1 10*3/uL (ref 1.7–7.7)
Neutrophils Relative %: 75 %
PLATELETS: 250 10*3/uL (ref 150–400)
RBC: 3.9 MIL/uL (ref 3.87–5.11)
RDW: 14.4 % (ref 11.5–15.5)
WBC: 5.5 10*3/uL (ref 4.0–10.5)

## 2016-08-16 LAB — URINALYSIS, ROUTINE W REFLEX MICROSCOPIC
BILIRUBIN URINE: NEGATIVE
GLUCOSE, UA: NEGATIVE mg/dL
Hgb urine dipstick: NEGATIVE
KETONES UR: NEGATIVE mg/dL
Nitrite: NEGATIVE
PROTEIN: NEGATIVE mg/dL
Specific Gravity, Urine: 1.014 (ref 1.005–1.030)
pH: 5 (ref 5.0–8.0)

## 2016-08-16 LAB — BRAIN NATRIURETIC PEPTIDE: B Natriuretic Peptide: 55.2 pg/mL (ref 0.0–100.0)

## 2016-08-16 MED ORDER — SODIUM CHLORIDE 0.9 % IV BOLUS (SEPSIS)
500.0000 mL | Freq: Once | INTRAVENOUS | Status: AC
  Administered 2016-08-16: 500 mL via INTRAVENOUS

## 2016-08-16 MED ORDER — CIPROFLOXACIN HCL 250 MG PO TABS: 250.0000 mg | ORAL_TABLET | Freq: Two times a day (BID) | ORAL | 0 refills | Status: DC

## 2016-08-16 MED ORDER — CEPHALEXIN 500 MG PO CAPS: 500.0000 mg | ORAL_CAPSULE | Freq: Three times a day (TID) | ORAL | 0 refills | Status: DC

## 2016-08-16 NOTE — ED Notes (Signed)
Pt drank water without difficulty.  EDP notified.

## 2016-08-16 NOTE — Discharge Instructions (Signed)
Stop taking the blood pressure medication lisinopril. This is likely causing the tongue swelling. Take the antibiotic for a urinary tract infection. Follow up with your doctor. Return to the ED if you develop difficulty breathing, difficulty swallowing, chest pain, shortness of breath, or any other concerns.

## 2017-09-24 ENCOUNTER — Emergency Department (HOSPITAL_COMMUNITY): Payer: Medicare Other

## 2017-09-24 ENCOUNTER — Observation Stay (HOSPITAL_COMMUNITY): Payer: Medicare Other

## 2017-09-24 ENCOUNTER — Inpatient Hospital Stay (HOSPITAL_COMMUNITY)
Admission: EM | Admit: 2017-09-24 | Discharge: 2017-09-26 | DRG: 065 | Disposition: A | Discharge status: Nursing Home (Includes ICF) | Payer: Medicare Other | Attending: Family Medicine | Admitting: Family Medicine

## 2017-09-24 DIAGNOSIS — M1711 Unilateral primary osteoarthritis, right knee: Secondary | ICD-10-CM | POA: Diagnosis present

## 2017-09-24 DIAGNOSIS — R29705 NIHSS score 5: Secondary | ICD-10-CM | POA: Diagnosis present

## 2017-09-24 DIAGNOSIS — H409 Unspecified glaucoma: Secondary | ICD-10-CM | POA: Diagnosis present

## 2017-09-24 DIAGNOSIS — R531 Weakness: Secondary | ICD-10-CM

## 2017-09-24 DIAGNOSIS — M810 Age-related osteoporosis without current pathological fracture: Secondary | ICD-10-CM | POA: Diagnosis present

## 2017-09-24 DIAGNOSIS — Z90710 Acquired absence of both cervix and uterus: Secondary | ICD-10-CM

## 2017-09-24 DIAGNOSIS — I2510 Atherosclerotic heart disease of native coronary artery without angina pectoris: Secondary | ICD-10-CM | POA: Diagnosis present

## 2017-09-24 DIAGNOSIS — G4733 Obstructive sleep apnea (adult) (pediatric): Secondary | ICD-10-CM | POA: Diagnosis present

## 2017-09-24 DIAGNOSIS — I10 Essential (primary) hypertension: Secondary | ICD-10-CM | POA: Diagnosis not present

## 2017-09-24 DIAGNOSIS — I509 Heart failure, unspecified: Secondary | ICD-10-CM | POA: Diagnosis present

## 2017-09-24 DIAGNOSIS — K219 Gastro-esophageal reflux disease without esophagitis: Secondary | ICD-10-CM

## 2017-09-24 DIAGNOSIS — I739 Peripheral vascular disease, unspecified: Secondary | ICD-10-CM | POA: Diagnosis present

## 2017-09-24 DIAGNOSIS — Z79899 Other long term (current) drug therapy: Secondary | ICD-10-CM

## 2017-09-24 DIAGNOSIS — F418 Other specified anxiety disorders: Secondary | ICD-10-CM | POA: Diagnosis present

## 2017-09-24 DIAGNOSIS — I6381 Other cerebral infarction due to occlusion or stenosis of small artery: Secondary | ICD-10-CM | POA: Diagnosis not present

## 2017-09-24 DIAGNOSIS — I350 Nonrheumatic aortic (valve) stenosis: Secondary | ICD-10-CM | POA: Diagnosis present

## 2017-09-24 DIAGNOSIS — F17210 Nicotine dependence, cigarettes, uncomplicated: Secondary | ICD-10-CM | POA: Diagnosis present

## 2017-09-24 DIAGNOSIS — G8191 Hemiplegia, unspecified affecting right dominant side: Secondary | ICD-10-CM | POA: Diagnosis not present

## 2017-09-24 DIAGNOSIS — F329 Major depressive disorder, single episode, unspecified: Secondary | ICD-10-CM | POA: Diagnosis present

## 2017-09-24 DIAGNOSIS — R251 Tremor, unspecified: Secondary | ICD-10-CM | POA: Diagnosis present

## 2017-09-24 DIAGNOSIS — I639 Cerebral infarction, unspecified: Secondary | ICD-10-CM | POA: Diagnosis not present

## 2017-09-24 DIAGNOSIS — E785 Hyperlipidemia, unspecified: Secondary | ICD-10-CM | POA: Diagnosis present

## 2017-09-24 DIAGNOSIS — Z888 Allergy status to other drugs, medicaments and biological substances status: Secondary | ICD-10-CM

## 2017-09-24 DIAGNOSIS — Z823 Family history of stroke: Secondary | ICD-10-CM

## 2017-09-24 DIAGNOSIS — F419 Anxiety disorder, unspecified: Secondary | ICD-10-CM | POA: Diagnosis present

## 2017-09-24 DIAGNOSIS — Z7951 Long term (current) use of inhaled steroids: Secondary | ICD-10-CM

## 2017-09-24 DIAGNOSIS — Z7982 Long term (current) use of aspirin: Secondary | ICD-10-CM

## 2017-09-24 DIAGNOSIS — G459 Transient cerebral ischemic attack, unspecified: Secondary | ICD-10-CM

## 2017-09-24 DIAGNOSIS — I130 Hypertensive heart and chronic kidney disease with heart failure and stage 1 through stage 4 chronic kidney disease, or unspecified chronic kidney disease: Secondary | ICD-10-CM | POA: Diagnosis present

## 2017-09-24 DIAGNOSIS — R202 Paresthesia of skin: Secondary | ICD-10-CM

## 2017-09-24 DIAGNOSIS — Z880 Allergy status to penicillin: Secondary | ICD-10-CM

## 2017-09-24 DIAGNOSIS — F0390 Unspecified dementia without behavioral disturbance: Secondary | ICD-10-CM | POA: Diagnosis present

## 2017-09-24 DIAGNOSIS — N183 Chronic kidney disease, stage 3 (moderate): Secondary | ICD-10-CM | POA: Diagnosis present

## 2017-09-24 LAB — BASIC METABOLIC PANEL
Anion gap: 9 (ref 5–15)
BUN: 19 mg/dL (ref 6–20)
CALCIUM: 8.6 mg/dL — AB (ref 8.9–10.3)
CHLORIDE: 100 mmol/L — AB (ref 101–111)
CO2: 28 mmol/L (ref 22–32)
CREATININE: 0.96 mg/dL (ref 0.44–1.00)
GFR calc Af Amer: 56 mL/min — ABNORMAL LOW (ref >60)
GFR calc non Af Amer: 49 mL/min — ABNORMAL LOW (ref >60)
GLUCOSE: 142 mg/dL — AB (ref 65–99)
Potassium: 4.2 mmol/L (ref 3.5–5.1)
Sodium: 137 mmol/L (ref 135–145)

## 2017-09-24 LAB — CBC
HEMATOCRIT: 37.8 % (ref 36.0–46.0)
HEMOGLOBIN: 11.7 g/dL — AB (ref 12.0–15.0)
MCH: 29.7 pg (ref 26.0–34.0)
MCHC: 31 g/dL (ref 30.0–36.0)
MCV: 95.9 fL (ref 78.0–100.0)
Platelets: 237 10*3/uL (ref 150–400)
RBC: 3.94 MIL/uL (ref 3.87–5.11)
RDW: 15.2 % (ref 11.5–15.5)
WBC: 5.6 10*3/uL (ref 4.0–10.5)

## 2017-09-24 LAB — TROPONIN I: Troponin I: <0.03 ng/mL (ref <0.03)

## 2017-09-24 MED ORDER — PROSIGHT PO TABS
1.0000 | ORAL_TABLET | Freq: Two times a day (BID) | ORAL | Status: DC
  Administered 2017-09-24 – 2017-09-26 (×4): 1 via ORAL
  Filled 2017-09-24 (×4): qty 1

## 2017-09-24 MED ORDER — FAMOTIDINE 20 MG PO TABS
20.0000 mg | ORAL_TABLET | Freq: Two times a day (BID) | ORAL | Status: DC
  Administered 2017-09-24 – 2017-09-26 (×4): 20 mg via ORAL
  Filled 2017-09-24: qty 2
  Filled 2017-09-24 (×3): qty 1

## 2017-09-24 MED ORDER — POLYETHYLENE GLYCOL 3350 17 G PO PACK
17.0000 g | PACK | Freq: Every day | ORAL | Status: DC
  Administered 2017-09-25 – 2017-09-26 (×2): 17 g via ORAL
  Filled 2017-09-24 (×2): qty 1

## 2017-09-24 MED ORDER — ATORVASTATIN CALCIUM 80 MG PO TABS
80.0000 mg | ORAL_TABLET | Freq: Every day | ORAL | Status: DC
  Administered 2017-09-24 – 2017-09-25 (×2): 80 mg via ORAL
  Filled 2017-09-24 (×2): qty 1

## 2017-09-24 MED ORDER — ASPIRIN 325 MG PO TABS
325.0000 mg | ORAL_TABLET | Freq: Every day | ORAL | Status: DC
  Administered 2017-09-24 – 2017-09-25 (×2): 325 mg via ORAL
  Filled 2017-09-24 (×2): qty 1

## 2017-09-24 MED ORDER — FLUTICASONE PROPIONATE 50 MCG/ACT NA SUSP
1.0000 | Freq: Every day | NASAL | Status: DC
  Administered 2017-09-25 – 2017-09-26 (×2): 1 via NASAL
  Filled 2017-09-24: qty 16

## 2017-09-24 MED ORDER — ONDANSETRON HCL 4 MG/2ML IJ SOLN: 4.0000 mg | Freq: Three times a day (TID) | INTRAMUSCULAR | Status: DC | PRN

## 2017-09-24 MED ORDER — CALCIUM CITRATE 950 (200 CA) MG PO TABS
200.0000 mg | ORAL_TABLET | Freq: Every day | ORAL | Status: DC
  Administered 2017-09-25 – 2017-09-26 (×2): 200 mg via ORAL
  Filled 2017-09-24 (×2): qty 1

## 2017-09-24 MED ORDER — POLYVINYL ALCOHOL 1.4 % OP SOLN
1.0000 [drp] | Freq: Four times a day (QID) | OPHTHALMIC | Status: DC | PRN
  Administered 2017-09-24 – 2017-09-25 (×2): 1 [drp] via OPHTHALMIC
  Filled 2017-09-24: qty 15

## 2017-09-24 MED ORDER — MUPIROCIN CALCIUM 2 % NA OINT
TOPICAL_OINTMENT | Freq: Two times a day (BID) | NASAL | Status: DC
  Filled 2017-09-24 (×17): qty 1

## 2017-09-24 MED ORDER — QUETIAPINE 12.5 MG HALF TABLET
12.5000 mg | ORAL_TABLET | Freq: Two times a day (BID) | ORAL | Status: DC
  Administered 2017-09-24 – 2017-09-26 (×4): 12.5 mg via ORAL
  Filled 2017-09-24 (×5): qty 1

## 2017-09-24 MED ORDER — DIPHENHYDRAMINE-ZINC ACETATE 2-0.1 % EX CREA
1.0000 "application " | TOPICAL_CREAM | Freq: Every day | CUTANEOUS | Status: DC
  Administered 2017-09-25 – 2017-09-26 (×2): 1 via TOPICAL
  Filled 2017-09-24: qty 28

## 2017-09-24 MED ORDER — PRESERVISION AREDS 2 PO CAPS: 1.0000 | ORAL_CAPSULE | Freq: Two times a day (BID) | ORAL | Status: DC

## 2017-09-24 MED ORDER — TIMOLOL HEMIHYDRATE 0.25 % OP SOLN
2.0000 [drp] | Freq: Two times a day (BID) | OPHTHALMIC | Status: DC
  Administered 2017-09-25 – 2017-09-26 (×3): 2 [drp] via OPHTHALMIC
  Filled 2017-09-24 (×2): qty 5

## 2017-09-24 MED ORDER — VITAMIN D 1000 UNITS PO TABS
1000.0000 [IU] | ORAL_TABLET | Freq: Every day | ORAL | Status: DC
  Administered 2017-09-25 – 2017-09-26 (×2): 1000 [IU] via ORAL
  Filled 2017-09-24 (×3): qty 1

## 2017-09-24 MED ORDER — LORATADINE 10 MG PO TABS: 10.0000 mg | ORAL_TABLET | Freq: Every day | ORAL | Status: DC | PRN

## 2017-09-24 MED ORDER — ACETAMINOPHEN 500 MG PO TABS: 500.0000 mg | ORAL_TABLET | Freq: Four times a day (QID) | ORAL | Status: DC | PRN

## 2017-09-24 MED ORDER — DULOXETINE HCL 60 MG PO CPEP
60.0000 mg | ORAL_CAPSULE | Freq: Every day | ORAL | Status: DC
  Administered 2017-09-25 – 2017-09-26 (×2): 60 mg via ORAL
  Filled 2017-09-24 (×2): qty 1

## 2017-09-24 MED ORDER — GUAIFENESIN ER 600 MG PO TB12
600.0000 mg | ORAL_TABLET | Freq: Two times a day (BID) | ORAL | Status: DC | PRN
  Administered 2017-09-25: 600 mg via ORAL
  Filled 2017-09-24: qty 1

## 2017-09-24 MED ORDER — LOTEPREDNOL ETABONATE 0.5 % OP SUSP
1.0000 [drp] | Freq: Two times a day (BID) | OPHTHALMIC | Status: DC
  Administered 2017-09-24 – 2017-09-26 (×4): 1 [drp] via OPHTHALMIC
  Filled 2017-09-24: qty 5

## 2017-09-24 MED ORDER — STROKE: EARLY STAGES OF RECOVERY BOOK
Freq: Once | Status: AC
  Administered 2017-09-24
  Filled 2017-09-24: qty 1

## 2017-09-24 MED ORDER — ACETAMINOPHEN 160 MG/5ML PO SOLN: 650.0000 mg | ORAL | Status: DC | PRN

## 2017-09-24 MED ORDER — BIOTENE DRY MOUTH MT LIQD
15.0000 mL | Freq: Three times a day (TID) | OROMUCOSAL | Status: DC
Start: 2017-09-24 — End: 2017-09-26
  Administered 2017-09-25 (×2): 15 mL via OROMUCOSAL

## 2017-09-24 MED ORDER — PSYLLIUM 95 % PO PACK
1.0000 | PACK | Freq: Every day | ORAL | Status: DC
  Administered 2017-09-24 – 2017-09-25 (×2): 1 via ORAL
  Filled 2017-09-24 (×3): qty 1

## 2017-09-24 MED ORDER — HYDRALAZINE HCL 20 MG/ML IJ SOLN: 5.0000 mg | INTRAMUSCULAR | Status: DC | PRN

## 2017-09-24 MED ORDER — OXYCODONE HCL 5 MG PO TABS
10.0000 mg | ORAL_TABLET | Freq: Two times a day (BID) | ORAL | Status: DC
  Administered 2017-09-24 – 2017-09-26 (×4): 10 mg via ORAL
  Filled 2017-09-24 (×4): qty 2

## 2017-09-24 MED ORDER — LIDOCAINE 5 % EX PTCH: 1.0000 | MEDICATED_PATCH | Freq: Every day | CUTANEOUS | Status: DC | PRN

## 2017-09-24 MED ORDER — SENNOSIDES-DOCUSATE SODIUM 8.6-50 MG PO TABS
1.0000 | ORAL_TABLET | Freq: Every day | ORAL | Status: DC
  Administered 2017-09-25: 1 via ORAL
  Filled 2017-09-24: qty 1

## 2017-09-24 MED ORDER — ALPRAZOLAM 0.25 MG PO TABS
0.1250 mg | ORAL_TABLET | Freq: Two times a day (BID) | ORAL | Status: DC | PRN
  Administered 2017-09-24 – 2017-09-25 (×2): 0.125 mg via ORAL
  Filled 2017-09-24 (×2): qty 1

## 2017-09-24 MED ORDER — ENOXAPARIN SODIUM 40 MG/0.4ML ~~LOC~~ SOLN
40.0000 mg | Freq: Every day | SUBCUTANEOUS | Status: DC
  Administered 2017-09-24: 40 mg via SUBCUTANEOUS
  Filled 2017-09-24: qty 0.4

## 2017-09-24 MED ORDER — DOCOSANOL 10 % EX CREA: 1.0000 "application " | TOPICAL_CREAM | Freq: Four times a day (QID) | CUTANEOUS | Status: DC | PRN

## 2017-09-24 MED ORDER — ASPIRIN 300 MG RE SUPP: 300.0000 mg | Freq: Every day | RECTAL | Status: DC

## 2017-09-24 MED ORDER — POLYETHYL GLYCOL-PROPYL GLYCOL 0.4-0.3 % OP SOLN: 1.0000 [drp] | Freq: Four times a day (QID) | OPHTHALMIC | Status: DC | PRN

## 2017-09-24 MED ORDER — PSYLLIUM 0.52 G PO CAPS: 0.5200 g | ORAL_CAPSULE | Freq: Every day | ORAL | Status: DC

## 2017-09-24 MED ORDER — MUSCLE RUB 10-15 % EX CREA
1.0000 "application " | TOPICAL_CREAM | Freq: Every day | CUTANEOUS | Status: DC | PRN
  Administered 2017-09-24 – 2017-09-26 (×3): 1 via TOPICAL
  Filled 2017-09-24: qty 85

## 2017-09-24 MED ORDER — SODIUM CHLORIDE 0.9 % IV SOLN
INTRAVENOUS | Status: DC
  Administered 2017-09-25: 1000 mL via INTRAVENOUS

## 2017-09-24 MED ORDER — MIRTAZAPINE 15 MG PO TABS
15.0000 mg | ORAL_TABLET | Freq: Every day | ORAL | Status: DC
  Administered 2017-09-24 – 2017-09-25 (×2): 15 mg via ORAL
  Filled 2017-09-24 (×2): qty 1

## 2017-09-24 MED ORDER — ZOLPIDEM TARTRATE 5 MG PO TABS: 5.0000 mg | ORAL_TABLET | Freq: Every evening | ORAL | Status: DC | PRN

## 2017-09-24 MED ORDER — ACETAMINOPHEN 650 MG RE SUPP: 650.0000 mg | RECTAL | Status: DC | PRN

## 2017-09-24 MED ORDER — LATANOPROST 0.005 % OP SOLN
1.0000 [drp] | Freq: Every day | OPHTHALMIC | Status: DC
  Administered 2017-09-24 – 2017-09-25 (×2): 1 [drp] via OPHTHALMIC
  Filled 2017-09-24: qty 2.5

## 2017-09-24 MED ORDER — MUPIROCIN 2 % EX OINT
TOPICAL_OINTMENT | Freq: Two times a day (BID) | CUTANEOUS | Status: DC
  Administered 2017-09-24 – 2017-09-26 (×4): via NASAL
  Filled 2017-09-24: qty 22

## 2017-09-24 MED ORDER — ACETAMINOPHEN 325 MG PO TABS
650.0000 mg | ORAL_TABLET | ORAL | Status: DC | PRN
Start: 2017-09-24 — End: 2017-09-26

## 2017-09-24 NOTE — Consult Note (Signed)
Neurology Consultation Reason for Consult: Stroke Referring Physician: Long, J  CC: Numbness  History is obtained from: Patient  HPI: Yvette Carroll is a 84 y.o. female with a history of hypertension who presents with numbness that she feels has resolved.  She states that she thinks it started around lunchtime.  She initially says her right side, however on pressing her she is not certain which side was numb.   LKW: Lunchtime tpa given?: no, mild symptoms   ROS: A 14 point ROS was performed and is negative except as noted in the HPI.   Past Medical History:  Diagnosis Date  . Depression   . GERD (gastroesophageal reflux disease)   . Glaucoma   . Hypertension   . Lumbar spinal stenosis   . Osteoporosis   . Right knee DJD   . Vertigo      Family history: Mother-stroke   Social History:  reports that  has never smoked. she has never used smokeless tobacco. She reports that she does not drink alcohol or use drugs.   Exam: Current vital signs: BP (!) 150/67   Pulse 83   Temp 97.7 F (36.5 C) (Oral)   Resp 19   SpO2 98%  Vital signs in last 24 hours: Temp:  [97.7 F (36.5 C)] 97.7 F (36.5 C) (03/19 1315) Pulse Rate:  [68-83] 83 (03/19 1930) Resp:  [12-25] 19 (03/19 1930) BP: (126-172)/(51-114) 150/67 (03/19 1930) SpO2:  [98 %-100 %] 98 % (03/19 1930)   Physical Exam  Constitutional: Appears well-developed and well-nourished.  Psych: Affect appropriate to situation Eyes: No scleral injection HENT: No OP obstrucion Head: Normocephalic.  Cardiovascular: Normal rate and regular rhythm.  Respiratory: Effort normal, non-labored breathing GI: Soft.  No distension. There is no tenderness.  Skin: WDI  Neuro: Mental Status: Patient is awake, alert, oriented to person,  month, she gives the year as 2018 Patient is able to give a clear and coherent history. No signs of aphasia or neglect Cranial Nerves: II: She has markedly restricted vision due to glaucoma.  She  is able to count visual fields in the left field of the left eye as well as of the right eye, difficulty in seeing on the right side in both eyes.  She has postsurgical pupils with minimal reactivity bilaterally. III,IV, VI: Extraocular movements intact V: Facial sensation is diminished on the left VII: Facial movement is symmetric.  VIII: hearing is intact to voice X: Uvula elevates symmetrically XI: Shoulder shrug is symmetric. XII: tongue is midline without atrophy or fasciculations.  Motor: She has 3/5 strength bilateral legs which she states is baseline, possible mild left arm weakness when testing the triceps, but no drift.  She has difficulty with abduction due to previous injury of her right shoulder. Sensory: Sensation is diminished on the left side Cerebellar: She has marked intentional and postural tremor right greater than sign left  I have reviewed labs in epic and the results pertinent to this consultation are: CMP-mildly elevated BUN and borderline GFR, otherwise unremarkable  I have reviewed the images obtained: MRI brain-small right thalamic infarct  Impression: 84 year old female with small thalamic infarct.  I suspect small vessel disease.  She is being admitted for stroke workup.  Recommendations: 1. HgbA1c, fasting lipid panel 2. MRA  of the brain without contrast 3. Frequent neuro checks 4. Echocardiogram 5. Carotid dopplers 6. Prophylactic therapy-Antiplatelet med: Aspirin - dose 325mg  PO or 300mg  PR 7. Risk factor modification 8. Telemetry monitoring 9. PT  consult, OT consult, Speech consult 10. please page stroke NP  Or  PA  Or MD  from 8am -4 pm as this patient will be followed by the stroke team at this point.   You can look them up on [email or web address]     Ritta Slot, MD [facility] [phone]  If 7pm- 7am, please page neurology on call as listed in AMION.

## 2017-09-24 NOTE — ED Notes (Signed)
Patient transported to MRI 

## 2017-09-24 NOTE — ED Triage Notes (Addendum)
Pt reports Right hand numbness during lunch today and that "its still there."  Pt denies any other symptoms at this time.  EMS reports that transfering facility reports that pt had right sided weakness, unknown last well time.

## 2017-09-24 NOTE — ED Notes (Signed)
Got patient undress on the monitor patient is resting with call bell in reach 

## 2017-09-24 NOTE — ED Notes (Signed)
Hospitalist at bedside for evaluation

## 2017-09-24 NOTE — ED Provider Notes (Signed)
Blood pressure (!) 132/51, pulse 72, temperature 97.7 F (36.5 C), temperature source Oral, resp. rate 19, SpO2 98 %.  Assuming care from Dr. Denton Lank.  In short, Yvette Carroll is a 84 y.o. female with a chief complaint of Weakness .  Refer to the original H&P for additional details.  The current plan of care is to follow MRI and reasses.  04:33 PM Updated patient and family at bedside regarding the plan for MRI and expected wait times. No neuro deficits on my exam.   06:20 PM Spoke with the patient and son at bedside regarding the MRI results which show a punctate thalamic infarct consistent with the patient's symptoms.  Presented the patient with the option of being admitted for observation to undergo echo and carotid ultrasound to help assess future stroke risk and possibly prevent a larger stroke in the future.  The patient and son would like some time to discuss this.  The patient is 89 and verbalizes that she may not be interested in additional testing as an inpatient.   Had discussion with patient. They would like CVA w/u as inpatient.    EKG Interpretation  Date/Time:  Tuesday September 24 2017 14:22:23 EDT Ventricular Rate:  73 PR Interval:    QRS Duration: 101 QT Interval:  412 QTC Calculation: 454 R Axis:   49 Text Interpretation:  Sinus rhythm Prolonged PR interval Nonspecific ST abnormality No significant change since last tracing Confirmed by Cathren Laine ([id number]) on 09/24/2017 2:34:32 PM      Discussed patient's case with Hospitalist to request admission. Patient and family (if present) updated with plan. Care transferred to Hospitalist service.  I reviewed all nursing notes, vitals, pertinent old records, EKGs, labs, imaging (as available).  Alona Bene, MD    Maia Plan, MD 09/25/17 [phone]

## 2017-09-24 NOTE — ED Provider Notes (Signed)
[facility] EMERGENCY DEPARTMENT Provider Note   CSN: [id number] Arrival date & time: 09/24/17  1302     History   Chief Complaint No chief complaint on file.   HPI Yvette Carroll is a 84 y.o. female.  Patient c/o right hand numbness. States awoke with hand feeling numb, states it has occurred before, but unsure of cause. Last definitive normal is unclear from ems report ?last night. Symptoms mild, persistent, denies specific exacerbating or alleviating factors. Denies change in speech or vision - although ?as to whether speech is mildly dysarthric compared to baseline. Denies weakness or loss of normal/baseline ability. States at baseline uses wheelchair. Denies headache. No neck or radicular pain. Denies fever or chills.    The history is provided by the patient and the EMS personnel.    Past Medical History:  Diagnosis Date  . Depression   . GERD (gastroesophageal reflux disease)   . Glaucoma   . Hypertension   . Lumbar spinal stenosis   . Osteoporosis   . Right knee DJD   . Vertigo     Patient Active Problem List   Diagnosis Date Noted  . Acute posthemorrhagic anemia 05/27/2013  . Chronic low back pain 04/30/2013  . Glaucoma 04/30/2013  . Allergic rhinitis 04/30/2013  . GERD (gastroesophageal reflux disease) 04/30/2013  . CAD (coronary artery disease) 04/30/2013  . Constipation 04/30/2013  . Hip fracture (HCC) 04/24/2013  . HTN (hypertension) 04/24/2013  . Anemia 04/24/2013    Past Surgical History:  Procedure Laterality Date  . ABDOMINAL HYSTERECTOMY    . EYE SURGERY    . FRACTURE SURGERY    . HIP FRACTURE SURGERY Left   . INTRAMEDULLARY (IM) NAIL INTERTROCHANTERIC Right 04/24/2013   Procedure: INTRAMEDULLARY (IM) NAIL INTERTROCHANTRIC;  Surgeon: Nadara Mustard, MD;  Location: [facility] OR;  Service: Orthopedics;  Laterality: Right;  . NECK SURGERY      OB History    No data available       Home Medications    Prior to Admission  medications   Medication Sig Start Date End Date Taking? Authorizing Provider  acetaminophen (TYLENOL) 500 MG tablet Take 500 mg by mouth 3 (three) times daily as needed for moderate pain.     [provider]  ALPRAZolam Prudy Feeler) 0.25 MG tablet Take 0.125 mg by mouth at bedtime.     [provider]  ALPRAZolam Prudy Feeler) 0.25 MG tablet Take 0.125 mg by mouth daily as needed for anxiety.    [provider]  antiseptic oral rinse (BIOTENE) LIQD 15 mLs by Mouth Rinse route 3 (three) times daily. May self administer    [provider]  aspirin EC 81 MG tablet Take 1 tablet (81 mg total) by mouth daily. Patient not taking: Reported on 08/12/2016 04/24/13   Nadara Mustard, MD  bimatoprost (LUMIGAN) 0.01 % SOLN Place 1 drop into both eyes every evening.     [provider]  calcium citrate (CALCITRATE - DOSED IN MG ELEMENTAL CALCIUM) 950 MG tablet Take 200 mg of elemental calcium by mouth daily.     [provider]  cephALEXin (KEFLEX) 500 MG capsule Take 1 capsule (500 mg total) by mouth 3 (three) times daily. 08/16/16   Rancour, Jeannett Senior, MD  cholecalciferol (VITAMIN D) 1000 units tablet Take 1,000 Units by mouth daily.     [provider]  diphenhydrAMINE-zinc acetate (BANOPHEN) cream Apply 1 application topically See admin instructions. Apply topically to chin every day  [provider]  Docosanol (ABREVA) 10 % CREA Apply 1 application topically 4 (four) times daily as needed (for fever blisters).     [provider]  DULoxetine (CYMBALTA) 60 MG capsule Take 60 mg by mouth daily.    [provider]  ENSURE PLUS (ENSURE PLUS) LIQD Take 237 mLs by mouth 2 (two) times daily.    [provider]  fluticasone (FLONASE) 50 MCG/ACT nasal spray Place 1 spray into both nostrils daily.     [provider]  furosemide (LASIX) 20 MG tablet Take 20 mg by mouth daily.    [provider]  guaiFENesin  (MUCINEX) 600 MG 12 hr tablet Take 600 mg by mouth 2 (two) times daily as needed (for congestion / excessive mucus).    [provider]  lidocaine (LIDODERM) 5 % Place 1 patch onto the skin daily as needed (for knee, hip and back pain). Remove & Discard patch within 12 hours or as directed by MD    [provider]  loratadine (CLARITIN) 10 MG tablet Take 10 mg by mouth daily as needed (for runny nose or sinus symptoms).     [provider]  loteprednol (LOTEMAX) 0.5 % ophthalmic suspension Place 1 drop into both eyes 2 (two) times daily.     [provider]  Menthol-Methyl Salicylate (MUSCLE RUB) 10-15 % CREA Apply 1 application topically daily as needed for muscle pain.    [provider]  metoprolol tartrate (LOPRESSOR) 25 MG tablet Take 12.5 mg by mouth 2 (two) times daily.    [provider]  mirtazapine (REMERON) 15 MG tablet Take 15 mg by mouth at bedtime.    [provider]  Multiple Vitamins-Minerals (PRESERVISION AREDS 2) CAPS Take 1 capsule by mouth 2 (two) times daily.     [provider]  mupirocin nasal ointment (BACTROBAN) 2 % Apply to the scab and inside the right side of the nose twice daily. Patient not taking: Reported on 08/12/2016 05/25/14   Arthor Captain, PA-C  OVER THE COUNTER MEDICATION Take 1 lozenge by mouth every 4 (four) hours as needed (for sore throat). Sore Throat Cherry 15MG -3.6MG  Lozenge    [provider]  Oxycodone HCl 10 MG TABS Take 10 mg by mouth 2 (two) times daily.    [provider]  Polyethyl Glycol-Propyl Glycol (SYSTANE) 0.4-0.3 % SOLN Place 1 drop into both eyes 4 (four) times daily as needed (for dry eyes).     [provider]  polyethylene glycol (MIRALAX / GLYCOLAX) packet Take 17 g by mouth daily.    [provider]  potassium chloride (K-DUR,KLOR-CON) 10 MEQ tablet Take 10 mEq by mouth daily. With Lasix    [provider]  psyllium (FIBER  LAXATIVE) 0.52 g capsule Take 0.52 g by mouth at bedtime.    [provider]  QUEtiapine (SEROQUEL) 25 MG tablet Take 12.5 mg by mouth 2 (two) times daily.    [provider]  ranitidine (ZANTAC) 150 MG tablet Take 150 mg by mouth 2 (two) times daily.    [provider]  senna-docusate (SENEXON-S) 8.6-50 MG tablet Take 1 tablet by mouth at bedtime.    [provider]  timolol (BETIMOL) 0.25 % ophthalmic solution Place 2 drops into both eyes 2 (two) times daily.     [provider]    Family History No family history on file.  Social History Social History   Tobacco Use  . Smoking status: Never Smoker  .  Smokeless tobacco: Never Used  Substance Use Topics  . Alcohol use: No  . Drug use: No     Allergies   Lisinopril; Iodine; Macrolides and ketolides; Penicillins; Risperdal [risperidone]; Shellfish allergy; and Betadine [povidone iodine]   Review of Systems Review of Systems  Constitutional: Negative for fever.  HENT: Negative for trouble swallowing.   Eyes: Negative for visual disturbance.  Respiratory: Negative for shortness of breath.   Cardiovascular: Negative for chest pain.  Gastrointestinal: Negative for abdominal pain.  Genitourinary: Negative for flank pain.  Musculoskeletal: Negative for neck pain.  Skin: Negative for rash.  Neurological: Positive for numbness. Negative for headaches.  Hematological: Does not bruise/bleed easily.  Psychiatric/Behavioral: Negative for confusion.     Physical Exam Updated Vital Signs There were no vitals taken for this visit.  Physical Exam  Constitutional: She appears well-developed and well-nourished. No distress.  HENT:  Head: Atraumatic.  Mouth/Throat: Oropharynx is clear and moist.  Eyes: Conjunctivae are normal. Pupils are equal, round, and reactive to light. No scleral icterus.  Neck: Neck supple. No tracheal deviation present. No thyromegaly present.  No bruits.     Cardiovascular: Normal rate, regular rhythm, normal heart sounds and intact distal pulses. Exam reveals no gallop and no friction rub.  No murmur heard. Pulmonary/Chest: Effort normal and breath sounds normal. No respiratory distress.  Abdominal: Soft. Normal appearance and bowel sounds are normal. She exhibits no distension. There is no tenderness.  Genitourinary:  Genitourinary Comments: No cva tenderness  Musculoskeletal: She exhibits no edema.  bil radial pulse 2+. c spine non tender, aligned. Good rom right shoulder/elbow/wrist without pain.   Neurological: She is alert. No cranial nerve deficit.  Speech is clear, although mildly slow ?mild dysarthria. Motor intact bil, stre 5/5. No pronator drift. sens grossly intact. Pt notes subjective left hand feeling numb  Skin: Skin is warm and dry. No rash noted. She is not diaphoretic.  Psychiatric: She has a normal mood and affect.  Nursing note and vitals reviewed.    ED Treatments / Results  Labs (all labs ordered are listed, but only abnormal results are displayed) Results for orders placed or performed during the hospital encounter of 08/15/16  CBC with Differential/Platelet  Result Value Ref Range   WBC 5.5 4.0 - 10.5 K/uL   RBC 3.90 3.87 - 5.11 MIL/uL   Hemoglobin 12.0 12.0 - 15.0 g/dL   HCT 16.1 09.6 - 04.5 %   MCV 97.9 78.0 - 100.0 fL   MCH 30.8 26.0 - 34.0 pg   MCHC 31.4 30.0 - 36.0 g/dL   RDW 40.9 81.1 - 91.4 %   Platelets 250 150 - 400 K/uL   Neutrophils Relative % 75 %   Neutro Abs 4.1 1.7 - 7.7 K/uL   Lymphocytes Relative 19 %   Lymphs Abs 1.0 0.7 - 4.0 K/uL   Monocytes Relative 3 %   Monocytes Absolute 0.2 0.1 - 1.0 K/uL   Eosinophils Relative 3 %   Eosinophils Absolute 0.2 0.0 - 0.7 K/uL   Basophils Relative 0 %   Basophils Absolute 0.0 0.0 - 0.1 K/uL  Comprehensive metabolic panel  Result Value Ref Range   Sodium 139 135 - 145 mmol/L   Potassium 4.5 3.5 - 5.1 mmol/L   Chloride 101 101 - 111 mmol/L   CO2  26 22 - 32 mmol/L   Glucose, Bld 98 65 - 99 mg/dL   BUN 28 (H) 6 - 20 mg/dL   Creatinine, Ser 7.82 0.44 -  1.00 mg/dL   Calcium 8.8 (L) 8.9 - 10.3 mg/dL   Total Protein 6.2 (L) 6.5 - 8.1 g/dL   Albumin 3.5 3.5 - 5.0 g/dL   AST 21 15 - 41 U/L   ALT 11 (L) 14 - 54 U/L   Alkaline Phosphatase 93 38 - 126 U/L   Total Bilirubin 0.5 0.3 - 1.2 mg/dL   GFR calc non Af Amer 52 (L) >60 mL/min   GFR calc Af Amer 60 (L) >60 mL/min   Anion gap 12 5 - 15  Urinalysis, Routine w reflex microscopic  Result Value Ref Range   Color, Urine YELLOW YELLOW   APPearance CLEAR CLEAR   Specific Gravity, Urine 1.014 1.005 - 1.030   pH 5.0 5.0 - 8.0   Glucose, UA NEGATIVE NEGATIVE mg/dL   Hgb urine dipstick NEGATIVE NEGATIVE   Bilirubin Urine NEGATIVE NEGATIVE   Ketones, ur NEGATIVE NEGATIVE mg/dL   Protein, ur NEGATIVE NEGATIVE mg/dL   Nitrite NEGATIVE NEGATIVE   Leukocytes, UA MODERATE (A) NEGATIVE   RBC / HPF 0-5 0 - 5 RBC/hpf   WBC, UA 6-30 0 - 5 WBC/hpf   Bacteria, UA RARE (A) NONE SEEN   Squamous Epithelial / LPF 0-5 (A) NONE SEEN   Mucus PRESENT    Hyaline Casts, UA PRESENT   Brain natriuretic peptide  Result Value Ref Range   B Natriuretic Peptide 55.2 0.0 - 100.0 pg/mL   Ct Head Wo Contrast  Result Date: 09/24/2017 CLINICAL DATA:  Right hand weakness.  Difficulty with speech. EXAM: CT HEAD WITHOUT CONTRAST TECHNIQUE: Contiguous axial images were obtained from the base of the skull through the vertex without intravenous contrast. COMPARISON:  CT head dated May 25, 2014. FINDINGS: Brain: No evidence of acute infarction, hemorrhage, hydrocephalus, extra-axial collection or mass lesion/mass effect. Stable mild cerebral atrophy and chronic microvascular ischemic changes. Unchanged lacunar infarct in the left basal ganglia. Vascular: Calcified atherosclerosis at the skullbase. No hyperdense vessel. Skull: Negative for fracture or focal lesion. Sinuses/Orbits: No acute finding. Other: None.  IMPRESSION: 1.  No acute intracranial abnormality. 2. Stable mild cerebral atrophy and chronic microvascular ischemic changes. Electronically Signed   By: Obie Dredge M.D.   On: 09/24/2017 14:03    EKG  EKG Interpretation None       Radiology Ct Head Wo Contrast  Result Date: 09/24/2017 CLINICAL DATA:  Right hand weakness.  Difficulty with speech. EXAM: CT HEAD WITHOUT CONTRAST TECHNIQUE: Contiguous axial images were obtained from the base of the skull through the vertex without intravenous contrast. COMPARISON:  CT head dated May 25, 2014. FINDINGS: Brain: No evidence of acute infarction, hemorrhage, hydrocephalus, extra-axial collection or mass lesion/mass effect. Stable mild cerebral atrophy and chronic microvascular ischemic changes. Unchanged lacunar infarct in the left basal ganglia. Vascular: Calcified atherosclerosis at the skullbase. No hyperdense vessel. Skull: Negative for fracture or focal lesion. Sinuses/Orbits: No acute finding. Other: None. IMPRESSION: 1.  No acute intracranial abnormality. 2. Stable mild cerebral atrophy and chronic microvascular ischemic changes. Electronically Signed   By: Obie Dredge M.D.   On: 09/24/2017 14:03    Procedures Procedures (including critical care time)  Medications Ordered in ED Medications  0.9 %  sodium chloride infusion (not administered)     Initial Impression / Assessment and Plan / ED Course  I have reviewed the triage vital signs and the nursing notes.  Pertinent labs & imaging results that were available during my care of the patient were reviewed by me and  considered in my medical decision making (see chart for details).  Iv ns. Labs. Ct.  Reviewed nursing notes and prior charts for additional history.   Labs reviewed - chemistries normal.   CT reviewed - no cva/acute abnormality.   Given right numbness, ?change in speech/dysarthria - will get mri.   If mri neg for cva, feel pt will be stable for d/c to  ecf with close pcp follow up as outpatient.   1530 MRI pending - signed out to Dr Jacqulyn Bath to check MRI - if neg for acute cva, tentative plan is for d/c back to ecf then.   Final Clinical Impressions(s) / ED Diagnoses   Final diagnoses:  None    ED Discharge Orders    None       Cathren Laine, MD 09/24/17 1531

## 2017-09-24 NOTE — Discharge Instructions (Signed)
It was our pleasure to provide your ER care today - we hope that you feel better.  Take an enteric coated aspirin a day.   Follow up with your primary care doctor in the coming week.  Return to ER if worse, new symptoms, trouble breathing, other medical emergency.

## 2017-09-24 NOTE — H&P (Signed)
History and Physical    Yvette Carroll:[id number] DOB: 1919-08-29 DOA: 09/24/2017  Referring MD/NP/PA:   PCP: Pearson Grippe, MD   Patient coming from:  The patient is coming from SNF.  At baseline, pt is dependent for most of ADL.    Chief Complaint: right sided numbness and weakness  HPI: Yvette Carroll is a 84 y.o. female with medical history significant of hypertension, CAD, GERD, depression with anxiety, glaucoma, who presents with right-sided numbness and weakness.  Pt is a poor historian, therefore, most of the history is obtained partially from pt and partially by discussing the case with ED physician, per EMS report, and with the nursing staff.  It seems pt started having right sided numbness and weakness since lunch time today per report. She states that she has numbness to her right hand and right leg, which has improved currently. She could not tell the exact time that her symptoms started. She does not have facial droop, slurred speech. Patient denies chest pain, shortness breath, cough. No nausea, vomiting, diarrhea, abdominal pain, symptoms of UTI. No fever or chills.  ED Course: pt was found to have WBC 5.6, electrolytes renal function okay, pending urinalysis, temperature normal, no tachycardia, oxygen saturation 98% on room air. CT head is negative. MRI showed punctate acute/subacute nonhemorrhagic infarct involving the lateral right thalamus. Pt is placed on telemetry bed for observation. EDP will consult neurology.  Review of Systems:   General: no fevers, chills, no body weight gain, has fatigue HEENT: no blurry vision, hearing changes or sore throat Respiratory: no dyspnea, coughing, wheezing CV: no chest pain, no palpitations GI: no nausea, vomiting, abdominal pain, diarrhea, constipation GU: no dysuria, burning on urination, increased urinary frequency, hematuria  Ext: no leg edema Neuro: has right sided numbness and weakness,  no vision change or hearing  loss Skin: no rash, no skin tear. MSK: No muscle spasm, no deformity, no limitation of range of movement in spin Heme: No easy bruising.  Travel history: No recent long distant travel.  Allergy:  Allergies  Allergen Reactions  . Lisinopril Swelling  . Iodine Other (See Comments)    On MAR  . Macrolides And Ketolides Other (See Comments)    Per MAR  . Penicillins Swelling  . Risperdal [Risperidone] Other (See Comments)    Per MAR  . Shellfish Allergy Other (See Comments)    Unknown  . Betadine [Povidone Iodine] Itching and Rash    Past Medical History:  Diagnosis Date  . Depression   . GERD (gastroesophageal reflux disease)   . Glaucoma   . Hypertension   . Lumbar spinal stenosis   . Osteoporosis   . Right knee DJD   . Vertigo     Past Surgical History:  Procedure Laterality Date  . ABDOMINAL HYSTERECTOMY    . EYE SURGERY    . FRACTURE SURGERY    . HIP FRACTURE SURGERY Left   . INTRAMEDULLARY (IM) NAIL INTERTROCHANTERIC Right 04/24/2013   Procedure: INTRAMEDULLARY (IM) NAIL INTERTROCHANTRIC;  Surgeon: Nadara Mustard, MD;  Location: [facility] OR;  Service: Orthopedics;  Laterality: Right;  . NECK SURGERY      Social History:  reports that  has never smoked. she has never used smokeless tobacco. She reports that she does not drink alcohol or use drugs.  Family History: tried to have reviewed with patient, but patient cannot provide detailed information about family history.   Prior to Admission medications   Medication Sig Start Date End Date  Taking? Authorizing Provider  acetaminophen (TYLENOL) 500 MG tablet Take 500 mg by mouth 3 (three) times daily as needed for moderate pain.     [provider]  ALPRAZolam Prudy Feeler) 0.25 MG tablet Take 0.125 mg by mouth at bedtime.     [provider]  ALPRAZolam Prudy Feeler) 0.25 MG tablet Take 0.125 mg by mouth daily as needed for anxiety.    [provider]  antiseptic oral rinse (BIOTENE) LIQD 15 mLs by Mouth  Rinse route 3 (three) times daily. May self administer    [provider]  aspirin EC 81 MG tablet Take 1 tablet (81 mg total) by mouth daily. Patient not taking: Reported on 08/12/2016 04/24/13   Nadara Mustard, MD  bimatoprost (LUMIGAN) 0.01 % SOLN Place 1 drop into both eyes every evening.     [provider]  calcium citrate (CALCITRATE - DOSED IN MG ELEMENTAL CALCIUM) 950 MG tablet Take 200 mg of elemental calcium by mouth daily.     [provider]  cephALEXin (KEFLEX) 500 MG capsule Take 1 capsule (500 mg total) by mouth 3 (three) times daily. 08/16/16   Rancour, Jeannett Senior, MD  cholecalciferol (VITAMIN D) 1000 units tablet Take 1,000 Units by mouth daily.     [provider]  diphenhydrAMINE-zinc acetate (BANOPHEN) cream Apply 1 application topically See admin instructions. Apply topically to chin every day    [provider]  Docosanol (ABREVA) 10 % CREA Apply 1 application topically 4 (four) times daily as needed (for fever blisters).     [provider]  DULoxetine (CYMBALTA) 60 MG capsule Take 60 mg by mouth daily.    [provider]  ENSURE PLUS (ENSURE PLUS) LIQD Take 237 mLs by mouth 2 (two) times daily.    [provider]  fluticasone (FLONASE) 50 MCG/ACT nasal spray Place 1 spray into both nostrils daily.     [provider]  furosemide (LASIX) 20 MG tablet Take 20 mg by mouth daily.    [provider]  guaiFENesin (MUCINEX) 600 MG 12 hr tablet Take 600 mg by mouth 2 (two) times daily as needed (for congestion / excessive mucus).    [provider]  lidocaine (LIDODERM) 5 % Place 1 patch onto the skin daily as needed (for knee, hip and back pain). Remove & Discard patch within 12 hours or as directed by MD    [provider]  loratadine (CLARITIN) 10 MG tablet Take 10 mg by mouth daily as needed (for runny nose or sinus symptoms).     [provider]  loteprednol (LOTEMAX)  0.5 % ophthalmic suspension Place 1 drop into both eyes 2 (two) times daily.     [provider]  Menthol-Methyl Salicylate (MUSCLE RUB) 10-15 % CREA Apply 1 application topically daily as needed for muscle pain.    [provider]  metoprolol tartrate (LOPRESSOR) 25 MG tablet Take 12.5 mg by mouth 2 (two) times daily.    [provider]  mirtazapine (REMERON) 15 MG tablet Take 15 mg by mouth at bedtime.    [provider]  Multiple Vitamins-Minerals (PRESERVISION AREDS 2) CAPS Take 1 capsule by mouth 2 (two) times daily.     [provider]  mupirocin nasal ointment (BACTROBAN) 2 % Apply to the scab and inside the right side of the nose twice daily. Patient not taking: Reported on 08/12/2016 05/25/14   Arthor Captain, PA-C  OVER THE COUNTER MEDICATION Take 1 lozenge by mouth every  4 (four) hours as needed (for sore throat). Sore Throat Cherry 15MG -3.6MG  Lozenge    [provider]  Oxycodone HCl 10 MG TABS Take 10 mg by mouth 2 (two) times daily.    [provider]  Polyethyl Glycol-Propyl Glycol (SYSTANE) 0.4-0.3 % SOLN Place 1 drop into both eyes 4 (four) times daily as needed (for dry eyes).     [provider]  polyethylene glycol (MIRALAX / GLYCOLAX) packet Take 17 g by mouth daily.    [provider]  potassium chloride (K-DUR,KLOR-CON) 10 MEQ tablet Take 10 mEq by mouth daily. With Lasix    [provider]  psyllium (FIBER LAXATIVE) 0.52 g capsule Take 0.52 g by mouth at bedtime.    [provider]  QUEtiapine (SEROQUEL) 25 MG tablet Take 12.5 mg by mouth 2 (two) times daily.    [provider]  ranitidine (ZANTAC) 150 MG tablet Take 150 mg by mouth 2 (two) times daily.    [provider]  senna-docusate (SENEXON-S) 8.6-50 MG tablet Take 1 tablet by mouth at bedtime.    [provider]  timolol (BETIMOL) 0.25 % ophthalmic solution Place 2 drops into both eyes 2 (two)  times daily.     [provider]    Physical Exam: Vitals:   09/24/17 1845 09/24/17 1900 09/24/17 1915 09/24/17 1930  BP: (!) 145/70 (!) 156/68 (!) 172/71 (!) 150/67  Pulse: 83 83 83 83  Resp: 19 (!) 23 (!) 25 19  Temp:      TempSrc:      SpO2: 99% 98% 98% 98%   General: Not in acute distress HEENT:       Eyes: PERRL, EOMI, no scleral icterus.       ENT: No discharge from the ears and nose, no pharynx injection, no tonsillar enlargement.        Neck: No JVD, no bruit, no mass felt. Heme: No neck lymph node enlargement. Cardiac: S1/S2, RRR, 3/6 systolic murmurs, No gallops or rubs. Respiratory: No rales, wheezing, rhonchi or rubs. GI: Soft, nondistended, nontender, no rebound pain, no organomegaly, BS present. GU: No hematuria Ext: No pitting leg edema bilaterally. 2+DP/PT pulse bilaterally. Musculoskeletal: No joint deformities, No joint redness or warmth, no limitation of ROM in spin. Skin: No rashes.  Neuro: Alert, oriented X3, cranial nerves II-XII grossly intact, moves all extremities normally. Muscle strength 5/5 in all extremities, sensation to light touch intact. Brachial reflex 2+ bilaterally.Negative Babinski's sign. Psych: Patient is not psychotic, no suicidal or hemocidal ideation.  Labs on Admission: I have personally reviewed following labs and imaging studies  CBC: Recent Labs  Lab 09/24/17 1328  WBC 5.6  HGB 11.7*  HCT 37.8  MCV 95.9  PLT 237   Basic Metabolic Panel: Recent Labs  Lab 09/24/17 1328  NA 137  K 4.2  CL 100*  CO2 28  GLUCOSE 142*  BUN 19  CREATININE 0.96  CALCIUM 8.6*   GFR: CrCl cannot be calculated (Unknown ideal weight.). Liver Function Tests: No results for input(s): AST, ALT, ALKPHOS, BILITOT, PROT, ALBUMIN in the last 168 hours. No results for input(s): LIPASE, AMYLASE in the last 168 hours. No results for input(s): AMMONIA in the last 168 hours. Coagulation Profile: No results for input(s): INR, PROTIME in the  last 168 hours. Cardiac Enzymes: No results for input(s): CKTOTAL, CKMB, CKMBINDEX, TROPONINI in the last 168 hours. BNP (last 3 results) No results for input(s): PROBNP in the last 8760 hours. HbA1C: No results  for input(s): HGBA1C in the last 72 hours. CBG: No results for input(s): GLUCAP in the last 168 hours. Lipid Profile: No results for input(s): CHOL, HDL, LDLCALC, TRIG, CHOLHDL, LDLDIRECT in the last 72 hours. Thyroid Function Tests: No results for input(s): TSH, T4TOTAL, FREET4, T3FREE, THYROIDAB in the last 72 hours. Anemia Panel: No results for input(s): VITAMINB12, FOLATE, FERRITIN, TIBC, IRON, RETICCTPCT in the last 72 hours. Urine analysis:    Component Value Date/Time   COLORURINE YELLOW 08/16/2016 0300   APPEARANCEUR CLEAR 08/16/2016 0300   LABSPEC 1.014 08/16/2016 0300   PHURINE 5.0 08/16/2016 0300   GLUCOSEU NEGATIVE 08/16/2016 0300   HGBUR NEGATIVE 08/16/2016 0300   BILIRUBINUR NEGATIVE 08/16/2016 0300   KETONESUR NEGATIVE 08/16/2016 0300   PROTEINUR NEGATIVE 08/16/2016 0300   UROBILINOGEN 1.0 03/14/2014 1543   NITRITE NEGATIVE 08/16/2016 0300   LEUKOCYTESUR MODERATE (A) 08/16/2016 0300   Sepsis Labs: @LABRCNTIP (procalcitonin:4,lacticidven:4) )No results found for this or any previous visit (from the past 240 hour(s)).   Radiological Exams on Admission: Ct Head Wo Contrast  Result Date: 09/24/2017 CLINICAL DATA:  Right hand weakness.  Difficulty with speech. EXAM: CT HEAD WITHOUT CONTRAST TECHNIQUE: Contiguous axial images were obtained from the base of the skull through the vertex without intravenous contrast. COMPARISON:  CT head dated May 25, 2014. FINDINGS: Brain: No evidence of acute infarction, hemorrhage, hydrocephalus, extra-axial collection or mass lesion/mass effect. Stable mild cerebral atrophy and chronic microvascular ischemic changes. Unchanged lacunar infarct in the left basal ganglia. Vascular: Calcified atherosclerosis at the skullbase.  No hyperdense vessel. Skull: Negative for fracture or focal lesion. Sinuses/Orbits: No acute finding. Other: None. IMPRESSION: 1.  No acute intracranial abnormality. 2. Stable mild cerebral atrophy and chronic microvascular ischemic changes. Electronically Signed   By: Obie Dredge M.D.   On: 09/24/2017 14:03   Mr Brain Wo Contrast  Result Date: 09/24/2017 CLINICAL DATA:  New onset of right hand numbness. Last known well was before going to bed last night. EXAM: MRI HEAD WITHOUT CONTRAST TECHNIQUE: Multiplanar, multiecho pulse sequences of the brain and surrounding structures were obtained without intravenous contrast. COMPARISON:  CT head without contrast 09/24/2017 FINDINGS: Brain: The diffusion-weighted images demonstrate acute nonhemorrhagic punctate infarct involving the lateral right thalamus. T2 changes are associated, compatible with a subacute time frame. No other acute infarct is present. No acute hemorrhage or mass lesion is present. Periventricular and subcortical T2 hyperintensities are present bilaterally. White matter changes extend into the brainstem. The cerebellum is unremarkable. The internal auditory canals are within normal limits bilaterally. Vascular: Flow is present in the major intracranial arteries. Skull and upper cervical spine: Skull base is within normal limits. The craniocervical junction is normal. Mild degenerative anterolisthesis is present at C3-4 and C4-5. Vertebral body heights and marrow signal are normal. Sinuses/Orbits: Fluid levels are present within the sphenoid sinus bilaterally. The paranasal sinuses are otherwise clear. Fluid is present in the mastoid air cells bilaterally. No obstructing nasopharyngeal lesion is evident. IMPRESSION: 1. Punctate acute/subacute nonhemorrhagic infarct involving the lateral right thalamus. 2. Moderate generalized atrophy and white matter disease otherwise likely reflects the sequela of chronic microvascular ischemia. 3. Bilateral  sphenoid sinus disease. These results were called by telephone at the time of interpretation on 09/24/2017 at 6:05 pm to Dr. Jacqulyn Bath, who verbally acknowledged these results. Electronically Signed   By: Marin Roberts M.D.   On: 09/24/2017 18:11     EKG: Independently reviewed.  Sinus rhythm, QTC 454, anteroseptal infarction pattern.   Assessment/Plan Principal Problem:  Stroke (cerebrum) (HCC) Active Problems:   HTN (hypertension)   GERD (gastroesophageal reflux disease)   CAD (coronary artery disease)   Depression with anxiety   Stroke (cerebrum) [facility]): MRI showed punctate acute/subacute nonhemorrhagic infarct involving the lateral right thalamus. Her symptoms have improved. Neurology will be consulted by EDP.  - will place on tele bed for obs - will follow up Neurology's Recs.  - Obtain MRA  - will hold Bp meds to allow permissive HTN  - Check carotid dopplers  - ASA  - add lipitor 80 mg daily - fasting lipid panel and HbA1c  - 2D transthoracic echocardiography  - PT/OT consult  HTN (hypertension) -will hold Bp meds (metoprolol and Lasix) to allow permissive HTN -prn hydralazine for SBP<220  GERD: -Pepcid  CAD (coronary artery disease): No chest pain -ASA, lipitor  Depression and anxiety: Stable, no suicidal or homicidal ideations. -Continue home medications: prn xanax, Remeron, Seroquel    DVT ppx: SQ Lovenox Code Status: Full code (I discussed with patient about code status, she wants to discuss with her son before she makes decision now. Will be full code now). Family Communication: None at bed side.    Disposition Plan:  Anticipate discharge back to previous SNF Consults called:  Neurology Admission status: Obs / tele  I     Date of Service 09/24/2017    Lorretta Harp [facility] Pager [phone]  If 7PM-7AM, please contact night-coverage [email or web address] Password [facility] 09/24/2017, 7:52 PM

## 2017-09-24 NOTE — ED Notes (Signed)
Attempted report 

## 2017-09-25 ENCOUNTER — Observation Stay (HOSPITAL_COMMUNITY): Payer: Medicare Other

## 2017-09-25 DIAGNOSIS — R251 Tremor, unspecified: Secondary | ICD-10-CM | POA: Diagnosis present

## 2017-09-25 DIAGNOSIS — I351 Nonrheumatic aortic (valve) insufficiency: Secondary | ICD-10-CM | POA: Diagnosis not present

## 2017-09-25 DIAGNOSIS — Z90710 Acquired absence of both cervix and uterus: Secondary | ICD-10-CM | POA: Diagnosis not present

## 2017-09-25 DIAGNOSIS — I130 Hypertensive heart and chronic kidney disease with heart failure and stage 1 through stage 4 chronic kidney disease, or unspecified chronic kidney disease: Secondary | ICD-10-CM | POA: Diagnosis present

## 2017-09-25 DIAGNOSIS — I509 Heart failure, unspecified: Secondary | ICD-10-CM | POA: Diagnosis present

## 2017-09-25 DIAGNOSIS — I739 Peripheral vascular disease, unspecified: Secondary | ICD-10-CM | POA: Diagnosis present

## 2017-09-25 DIAGNOSIS — M1711 Unilateral primary osteoarthritis, right knee: Secondary | ICD-10-CM | POA: Diagnosis present

## 2017-09-25 DIAGNOSIS — E785 Hyperlipidemia, unspecified: Secondary | ICD-10-CM | POA: Diagnosis present

## 2017-09-25 DIAGNOSIS — G4733 Obstructive sleep apnea (adult) (pediatric): Secondary | ICD-10-CM | POA: Diagnosis present

## 2017-09-25 DIAGNOSIS — Z888 Allergy status to other drugs, medicaments and biological substances status: Secondary | ICD-10-CM | POA: Diagnosis not present

## 2017-09-25 DIAGNOSIS — F419 Anxiety disorder, unspecified: Secondary | ICD-10-CM | POA: Diagnosis present

## 2017-09-25 DIAGNOSIS — Z823 Family history of stroke: Secondary | ICD-10-CM | POA: Diagnosis not present

## 2017-09-25 DIAGNOSIS — F0390 Unspecified dementia without behavioral disturbance: Secondary | ICD-10-CM | POA: Diagnosis present

## 2017-09-25 DIAGNOSIS — R29705 NIHSS score 5: Secondary | ICD-10-CM | POA: Diagnosis present

## 2017-09-25 DIAGNOSIS — I2510 Atherosclerotic heart disease of native coronary artery without angina pectoris: Secondary | ICD-10-CM

## 2017-09-25 DIAGNOSIS — I639 Cerebral infarction, unspecified: Secondary | ICD-10-CM

## 2017-09-25 DIAGNOSIS — F329 Major depressive disorder, single episode, unspecified: Secondary | ICD-10-CM | POA: Diagnosis present

## 2017-09-25 DIAGNOSIS — I10 Essential (primary) hypertension: Secondary | ICD-10-CM | POA: Diagnosis not present

## 2017-09-25 DIAGNOSIS — K219 Gastro-esophageal reflux disease without esophagitis: Secondary | ICD-10-CM | POA: Diagnosis present

## 2017-09-25 DIAGNOSIS — H409 Unspecified glaucoma: Secondary | ICD-10-CM | POA: Diagnosis present

## 2017-09-25 DIAGNOSIS — I6381 Other cerebral infarction due to occlusion or stenosis of small artery: Secondary | ICD-10-CM | POA: Diagnosis present

## 2017-09-25 DIAGNOSIS — I350 Nonrheumatic aortic (valve) stenosis: Secondary | ICD-10-CM | POA: Diagnosis present

## 2017-09-25 DIAGNOSIS — N183 Chronic kidney disease, stage 3 (moderate): Secondary | ICD-10-CM | POA: Diagnosis present

## 2017-09-25 DIAGNOSIS — Z880 Allergy status to penicillin: Secondary | ICD-10-CM | POA: Diagnosis not present

## 2017-09-25 DIAGNOSIS — Z7982 Long term (current) use of aspirin: Secondary | ICD-10-CM | POA: Diagnosis not present

## 2017-09-25 DIAGNOSIS — F17210 Nicotine dependence, cigarettes, uncomplicated: Secondary | ICD-10-CM | POA: Diagnosis present

## 2017-09-25 DIAGNOSIS — G8191 Hemiplegia, unspecified affecting right dominant side: Secondary | ICD-10-CM | POA: Diagnosis present

## 2017-09-25 DIAGNOSIS — M810 Age-related osteoporosis without current pathological fracture: Secondary | ICD-10-CM | POA: Diagnosis present

## 2017-09-25 LAB — LIPID PANEL
CHOL/HDL RATIO: 3.2 ratio
Cholesterol: 155 mg/dL (ref 0–200)
HDL: 49 mg/dL (ref >40)
LDL CALC: 84 mg/dL (ref 0–99)
TRIGLYCERIDES: 110 mg/dL (ref <150)
VLDL: 22 mg/dL (ref 0–40)

## 2017-09-25 LAB — URINALYSIS, ROUTINE W REFLEX MICROSCOPIC
Bacteria, UA: NONE SEEN
Bilirubin Urine: NEGATIVE
GLUCOSE, UA: NEGATIVE mg/dL
Hgb urine dipstick: NEGATIVE
KETONES UR: NEGATIVE mg/dL
Nitrite: NEGATIVE
PROTEIN: NEGATIVE mg/dL
Specific Gravity, Urine: 1.011 (ref 1.005–1.030)
pH: 7 (ref 5.0–8.0)

## 2017-09-25 LAB — HEMOGLOBIN A1C
HEMOGLOBIN A1C: 5.4 % (ref 4.8–5.6)
Mean Plasma Glucose: 108.28 mg/dL

## 2017-09-25 LAB — ECHOCARDIOGRAM COMPLETE
Height: 62 in
Weight: 2003.54 oz

## 2017-09-25 LAB — MRSA PCR SCREENING: MRSA BY PCR: POSITIVE — AB

## 2017-09-25 MED ORDER — ENOXAPARIN SODIUM 30 MG/0.3ML ~~LOC~~ SOLN
30.0000 mg | Freq: Every day | SUBCUTANEOUS | Status: DC
  Administered 2017-09-25: 30 mg via SUBCUTANEOUS
  Filled 2017-09-25: qty 0.3

## 2017-09-25 MED ORDER — CLOPIDOGREL BISULFATE 75 MG PO TABS
75.0000 mg | ORAL_TABLET | Freq: Every day | ORAL | Status: DC
  Administered 2017-09-26: 75 mg via ORAL
  Filled 2017-09-25: qty 1

## 2017-09-25 NOTE — Progress Notes (Signed)
Carotid artery duplex has been completed. 1-39% ICA stenosis bilaterally.  09/25/17 3:03 PM Olen Cordial RVT

## 2017-09-25 NOTE — Evaluation (Signed)
Physical Therapy Evaluation Patient Details Name: Yvette Carroll MRN: [id number] DOB: [birth year] Today's Date: 09/25/2017   History of Present Illness  84 y.o. female with medical history significant of hypertension, CAD, GERD, depression with anxiety, glaucoma, who presents with right-sided numbness and weakness. MRI showing punctate acute/subacute nonhemorrhagic infarct involving the lateral right thalamus.  Clinical Impression  PTA pt resident at [facility] transferring to w/c and propelling herself to dining room. Pt reports needing more and more help with ADLs due to increased tremors and inability to coordinate movement. Pt is currently very variable in her needs for assistance due to tremors. Pt min guard to minA for bed mobility and minAx2 to modAx2 for transfers with HHA. Pt would benefit from skilled PT when d/c'd back to [facility] to work on balance and coordination and adaptive techniques to help her keep her ability to perform independent transfers. PT will continue to follow acutely.    Follow Up Recommendations SNF    Equipment Recommendations  None recommended by PT    Recommendations for Other Services       Precautions / Restrictions Precautions Precautions: Fall Restrictions Weight Bearing Restrictions: No      Mobility  Bed Mobility Overal bed mobility: Needs Assistance Bed Mobility: Supine to Sit     Supine to sit: HOB elevated;Min guard;Min assist     General bed mobility comments: HOB elevated for trunk support, able to lift torso off bed and begin turning to EoB however began having gross tremors requiring minA to maintain balance, minA for pad scoot of hips to EoB and pt using bedrails to pull hips towards HOB. Min Guard A for safety  Transfers Overall transfer level: Needs assistance   Transfers: Sit to/from [employer] Sit to Stand: Min assist;+2 physical assistance;Mod assist Stand pivot transfers: Mod assist;+2 physical  assistance       General transfer comment: On standing from bed required good power up however requires modA for steadying, ModA for SPT to recliner with vc for steppin and tactile cues for hand placement   Modified Rankin (Stroke Patients Only) Modified Rankin (Stroke Patients Only) Pre-Morbid Rankin Score: Moderately severe disability Modified Rankin: Moderately severe disability     Balance Overall balance assessment: Needs assistance Sitting-balance support: No upper extremity supported;Feet supported Sitting balance-Leahy Scale: Poor Sitting balance - Comments: required assist due to gross tremors with sitting EOB,    Standing balance support: Bilateral upper extremity supported;During functional activity Standing balance-Leahy Scale: Poor Standing balance comment: Reliant on physical A                             Pertinent Vitals/Pain Pain Assessment: No/denies pain    Home Living Family/patient expects to be discharged to:: Skilled nursing facility                 Additional Comments: Pt reporting [facility]    Prior Function Level of Independence: Needs assistance   Gait / Transfers Assistance Needed: w/c for funcitonal mobility. Pt reports she stand pivots to w/c with RW  ADL's / Homemaking Assistance Needed: Reports she does her ADLs with assistance as needed  Comments: Pt poor historian and not family present to confirm information.     Hand Dominance   Dominant Hand: Right    Extremity/Trunk Assessment   Upper Extremity Assessment Upper Extremity Assessment: Defer to OT evaluation RUE Deficits / Details: Pt reports fall (a month ago)  which injuried her RUE. Limited shoulder ROM with flexion from 0-80. Able to touch her nose and reach for cup. Unable to bring hand to top of head. Pt using RUE for self feeding. Poor grasp strength but demonstrating descent in hand manipulation to manage eating utensils. RUE Coordination: decreased  gross motor    Lower Extremity Assessment Lower Extremity Assessment: LLE deficits/detail;RLE deficits/detail RLE Deficits / Details: strength grossly assessed at 3/5, ROM WFL RLE Sensation: decreased light touch RLE Coordination: decreased fine motor LLE Deficits / Details: pt reports fallin on L hip in recent past and that it is sore, L LE strength grossly assessed at 3/5, ROM WFL LLE Coordination: decreased gross motor;decreased fine motor    Cervical / Trunk Assessment Cervical / Trunk Assessment: Kyphotic  Communication   Communication: No difficulties  Cognition Arousal/Alertness: Awake/alert Behavior During Therapy: WFL for tasks assessed/performed Overall Cognitive Status: Impaired/Different from baseline Area of Impairment: Memory                     Memory: Decreased short-term memory         General Comments: Pt able to answer yes/no and simple questions, decreased memory of prior function,and how long she has had tremors      General Comments General comments (skin integrity, edema, etc.): Pt had a few episodes of gross tremors during session, Pt reports she has had them for the last 3 months. Tremors could be sources of numerous falls and they seem to wax and wane without warning'        Assessment/Plan    PT Assessment Patient needs continued PT services  PT Problem List Decreased strength;Decreased range of motion;Decreased activity tolerance;Decreased balance;Decreased mobility;Decreased coordination;Decreased safety awareness;Decreased cognition       PT Treatment Interventions DME instruction;Gait training;Functional mobility training;Therapeutic activities;Therapeutic exercise;Balance training;Neuromuscular re-education;Cognitive remediation;Patient/family education    PT Goals (Current goals can be found in the Care Plan section)  Acute Rehab PT Goals Patient Stated Goal: Return to [facility] PT Goal Formulation: With patient Time For  Goal Achievement: 10/09/17 Potential to Achieve Goals: Fair    Frequency Min 3X/week    AM-PAC PT "6 Clicks" Daily Activity  Outcome Measure Difficulty turning over in bed (including adjusting bedclothes, sheets and blankets)?: A Little Difficulty moving from lying on back to sitting on the side of the bed? : Unable Difficulty sitting down on and standing up from a chair with arms (e.g., wheelchair, bedside commode, etc,.)?: Unable Help needed moving to and from a bed to chair (including a wheelchair)?: A Lot Help needed walking in hospital room?: Total Help needed climbing 3-5 steps with a railing? : Total 6 Click Score: 9    End of Session Equipment Utilized During Treatment: Gait belt Activity Tolerance: Patient tolerated treatment well Patient left: in chair;with call bell/phone within reach;with chair alarm set Nurse Communication: Mobility status PT Visit Diagnosis: Unsteadiness on feet (R26.81);Other abnormalities of gait and mobility (R26.89);Muscle weakness (generalized) (M62.81);Repeated falls (R29.6);Difficulty in walking, not elsewhere classified (R26.2)    Time: 5409-8119 PT Time Calculation (min) (ACUTE ONLY): 23 min   Charges:   PT Evaluation $PT Eval Moderate Complexity: 1 Mod PT Treatments $Therapeutic Activity: 8-22 mins   PT G Codes:        Yvette Carroll PT, DPT Acute Rehabilitation  [phone] Pager [phone]    Yvette Carroll 09/25/2017, 4:25 PM

## 2017-09-25 NOTE — Progress Notes (Signed)
PROGRESS NOTE  Yvette Carroll:[id number] DOB: 1919-10-27 DOA: 09/24/2017 PCP: Pearson Grippe, MD  Brief Narrative: 84 year old woman PMH essential hypertension, coronary artery disease presented with right-sided numbness and weakness right hand and leg.  Uncertain duration.  MRI showed punctate acute/subacute nonhemorrhagic infarct lateral right thalamus.  Admitted for further evaluation of stroke.  Assessment/Plan Nonhemorrhagic infarct lateral right thalamus.  Occupational therapy recommended SNF.  Speech therapy recommended no further evaluation or treatment.   -- Await echo and carotid Doppler studies. --Continue aspirin, atorvastatin.  Further treatment per stroke service.  --Await physical and occupational therapy evaluation  Essential hypertension --Stable.  Allow permissive hypertension  Coronary artery disease --Stable.  Continue aspirin, Lipitor  Depression, anxiety --Continue Seroquel, Remeron, Xanax as needed   DVT prophylaxis: Enoxaparin Code Status: Full Family Communication:  Disposition Plan: Skilled nursing facility   Brendia Sacks, MD  [facility] Direct contact: [phone] --Via amion app OR  --[email or web address]; password [email or web address]  7PM-7AM contact night coverage as above 09/25/2017, 2:25 PM  LOS: 0 days   Consultants:  Neurology   Procedures:    Antimicrobials:    Interval history/Subjective: Feels well.  Objective: Vitals:  Vitals:   09/25/17 0500 09/25/17 1206  BP: (!) 120/50 (!) 134/45  Pulse: (!) 47 85  Resp: 18 18  Temp: 98 F (36.7 C) 97.9 F (36.6 C)  SpO2: 95% 96%    Exam:  Constitutional:  . Appears calm and comfortable Eyes:  . pupils and irises appear normal . Normal lids  ENMT:  . grossly normal hearing  Respiratory:  . CTA bilaterally, no w/r/r.  . Respiratory effort normal Cardiovascular:  . RRR, 2/6 systolic murmur.  No rub or gallop. . No LE extremity edema   Musculoskeletal:  .  RUE, LUE, RLE, LLE     o strength and tone normal, no atrophy o No tenderness Neurologic:  . CN appear grossly intact Psychiatric:  . Mental status o Mood, affect appropriate   I have personally reviewed the following:   Labs:  Basic metabolic panel unremarkable except random blood sugar 142  LDL 84, hemoglobin A1c 5.4, CBC unremarkable  Imaging studies:  CT had no acute abnormalities  MRI brain acute/subacute nonhemorrhagic infarct lateral right thalamus  MCA had no large vessel occlusion, severe left and moderate to severe right P2 stenosis  Medical tests:  EKG sinus rhythm   Scheduled Meds: . antiseptic oral rinse  15 mL Mouth Rinse TID  . aspirin  300 mg Rectal Daily   Or  . aspirin  325 mg Oral Daily  . atorvastatin  80 mg Oral q1800  . calcium citrate  200 mg of elemental calcium Oral Daily  . cholecalciferol  1,000 Units Oral Daily  . diphenhydrAMINE-zinc acetate  1 application Topical Daily  . DULoxetine  60 mg Oral Daily  . enoxaparin (LOVENOX) injection  30 mg Subcutaneous QHS  . famotidine  20 mg Oral BID  . fluticasone  1 spray Each Nare Daily  . latanoprost  1 drop Both Eyes QHS  . loteprednol  1 drop Both Eyes BID  . mirtazapine  15 mg Oral QHS  . multivitamin  1 tablet Oral BID  . mupirocin ointment   Nasal BID  . oxyCODONE  10 mg Oral BID  . polyethylene glycol  17 g Oral Daily  . psyllium  1 packet Oral QHS  . QUEtiapine  12.5 mg Oral BID  . senna-docusate  1 tablet Oral QHS  . timolol  2 drop  Both Eyes BID   Continuous Infusions: . sodium chloride 1,000 mL (09/25/17 0033)    Principal Problem:   Acute CVA (cerebrovascular accident) (HCC) Active Problems:   CAD (coronary artery disease)   Depression with anxiety   Benign essential HTN   LOS: 0 days

## 2017-09-25 NOTE — Evaluation (Signed)
Speech Language Pathology Evaluation Patient Details Name: ELESE WICKERT MRN: [id number] DOB: [birth year] Today's Date: 09/25/2017 Time: 1000-1021 SLP Time Calculation (min) (ACUTE ONLY): 21 min  Problem List:  Patient Active Problem List   Diagnosis Date Noted  . Stroke (cerebrum) (HCC) 09/24/2017  . Depression with anxiety 09/24/2017  . Acute posthemorrhagic anemia 05/27/2013  . Chronic low back pain 04/30/2013  . Glaucoma 04/30/2013  . Allergic rhinitis 04/30/2013  . GERD (gastroesophageal reflux disease) 04/30/2013  . CAD (coronary artery disease) 04/30/2013  . Constipation 04/30/2013  . Hip fracture (HCC) 04/24/2013  . HTN (hypertension) 04/24/2013  . Anemia 04/24/2013   Past Medical History:  Past Medical History:  Diagnosis Date  . Depression   . GERD (gastroesophageal reflux disease)   . Glaucoma   . Hypertension   . Lumbar spinal stenosis   . Osteoporosis   . Right knee DJD   . Vertigo    Past Surgical History:  Past Surgical History:  Procedure Laterality Date  . ABDOMINAL HYSTERECTOMY    . EYE SURGERY    . FRACTURE SURGERY    . HIP FRACTURE SURGERY Left   . INTRAMEDULLARY (IM) NAIL INTERTROCHANTERIC Right 04/24/2013   Procedure: INTRAMEDULLARY (IM) NAIL INTERTROCHANTRIC;  Surgeon: Nadara Mustard, MD;  Location: [facility] OR;  Service: Orthopedics;  Laterality: Right;  . NECK SURGERY     HPI:  84 y.o.femalewith medical history significant ofhypertension, CAD, GERD, depression with anxiety, glaucoma, who presents with right-sided numbness and weakness.  MRI showed punctate acute/subacute nonhemorrhagic infarct involving the lateral right thalamus.   Assessment / Plan / Recommendation Clinical Impression  Pt presents with a subtle dysarthria of speech; cognition is likely at baseline - there is mild confusion, short-term memory deficits.  Language is intact; pt is asking questions about her medical condition and is able to give history.  No acute deficits  identified which would require SLP intervention.  Pt is to return to SNF. Our services will sign off.      SLP Assessment  SLP Recommendation/Assessment: Patient does not need any further Speech Lanaguage Pathology Services    Follow Up Recommendations  None    Frequency and Duration           SLP Evaluation Cognition  Overall Cognitive Status: Difficult to assess Arousal/Alertness: Awake/alert Orientation Level: Oriented to place;Oriented to person;Oriented to situation;Disoriented to time Attention: Sustained Sustained Attention: Appears intact Memory: Impaired Memory Impairment: Retrieval deficit Awareness: Appears intact Problem Solving: Impaired Executive Function: Landscape architect: Impaired       Comprehension  Auditory Comprehension Overall Auditory Comprehension: Appears within functional limits for tasks assessed Visual Recognition/Discrimination Discrimination: Within Function Limits Reading Comprehension Reading Status: Not tested    Expression Expression Primary Mode of Expression: Verbal Verbal Expression Overall Verbal Expression: Appears within functional limits for tasks assessed Written Expression Dominant Hand: Right Written Expression: Not tested   Oral / Motor  Oral Motor/Sensory Function Overall Oral Motor/Sensory Function: Within functional limits Motor Speech Overall Motor Speech: Appears within functional limits for tasks assessed   GO                    Blenda Mounts Laurice 09/25/2017, 10:25 AM

## 2017-09-25 NOTE — Progress Notes (Signed)
Patient 84 years old white female admitted from [facility] ED pt alert  Oriented to persom place and month but disoriented to the year &  re oriented. Follows command . Noted some jerking of    head and neck movement like a hiccup but pt sated it nerve problem such as anxiety.  occationa slurred speech also noted.ON AND O  oriented to staff and room. Cardiac monitor applied and verification comPleted. Call bell within pt reach. Pt c/o numbness RUA. PT ALSO HAS IMPAIRMENT OF BOTH EYES DUE TO GLAUCOMA.  Will continue to monitor pt.

## 2017-09-25 NOTE — Progress Notes (Addendum)
NEUROHOSPITALISTS STROKE TEAM - DAILY PROGRESS NOTE   ADMISSION HISTORY: Yvette Carroll is a 84 y.o. female with medical history significant of hypertension, CAD, GERD, depression with anxiety, glaucoma, who presents with right-sided numbness and weakness. It seems pt started having right sided numbness and weakness since lunch time today per report. Yvette Carroll states that Yvette Carroll has numbness to Yvette Carroll right hand and right leg, which has improved currently. Yvette Carroll could not tell the exact time that Yvette Carroll symptoms started. Yvette Carroll does not have facial droop, slurred speech. Yvette Carroll denies chest pain, shortness breath, cough. No nausea, vomiting, diarrhea, abdominal pain, symptoms of UTI. No fever or chills.   SUBJECTIVE (INTERVAL HISTORY) Yvette Carroll in bed, with mild underlying possible senile dementia, with an accuracy forgetfulness and giving HPI and review of systems.  Yvette Carroll states Yvette Carroll had right-sided numbness especially in Yvette Carroll arm but forgetful as to if it had occurred in the left also.  Yvette Carroll denies headache dizziness, blurred vision, nausea or vomiting.  Admits to improving right arm weakness.  Laboratory Results  CBC:  Recent Labs  Lab 09/24/17 1328  WBC 5.6  HGB 11.7*  HCT 37.8  MCV 95.9  PLT 237   BMP: Recent Labs  Lab 09/24/17 1328  NA 137  K 4.2  CL 100*  CO2 28  GLUCOSE 142*  BUN 19  CREATININE 0.96  CALCIUM 8.6*    Cardiac Enzymes:  Recent Labs  Lab 09/24/17 2159  TROPONINI <0.03   Urinalysis:  Recent Labs  Lab 09/25/17 0100  COLORURINE YELLOW  APPEARANCEUR CLEAR  LABSPEC 1.011  PHURINE 7.0  GLUCOSEU NEGATIVE  HGBUR NEGATIVE  BILIRUBINUR NEGATIVE  KETONESUR NEGATIVE  PROTEINUR NEGATIVE  NITRITE NEGATIVE  LEUKOCYTESUR TRACE*    Physical Examination   Vitals:   09/25/17 0030 09/25/17 0110 09/25/17 0500 09/25/17 1206  BP: (!) 163/68 (!) 145/71 (!) 120/50 (!) 134/45  Pulse: 90 82 (!) 47 85  Resp:  18 18 18   Temp:   97.9 F (36.6 C) 98 F (36.7 C) 97.9 F (36.6 C)  TempSrc:  Oral Oral Oral  SpO2:  94% 95% 96%  Weight:      Height:       General - Well nourished, well developed, in no apparent distress HEENT-  Normocephalic,  Cardiovascular - Regular rate and rhythm  Respiratory - Lungs clear bilaterally. No wheezing. Abdomen - soft and non-tender, BS normal Extremities- no edema or cyanosis  Neurological Examination  Mental Status -   Possible underlying senile dementia,  level of arousal and orientation to time, place, and person were intact. Mild dysarthria Attention span and concentration were normal Recent and remote memory were intact Fund of Knowledge was assessed and was intact Cranial Nerves II - XII - II - Visual field intact OU III, IV, VI - Extraocular movements intact. V - Facial sensation intact bilaterally. VII - Facial movement intact bilaterally VIII - Hearing & vestibular intact bilaterally X - Palate elevates symmetrically XI - Chin turning & shoulder shrug intact bilaterally. XII - Tongue protrusion intact Motor Strength - The Yvette Carroll's strength was normal in all extremities and pronator drift was absent.  Bulk was normal and fasciculations were absent . Intermittent hiccups and tremors Motor Tone - Muscle tone was assessed at the neck and appendages and was normal Reflexes - The Yvette Carroll's reflexes were symmetrical in all extremities and Yvette Carroll had no pathological reflexes Sensory - Light touch was assessed and was symmetrical Coordination - The Yvette Carroll had normal movements in the  hands and feet with no ataxia or dysmetria.  Tremor was absent Gait and Station - deferred.  Imaging Results  Ct Head Wo Contrast 09/24/2017 IMPRESSION:  1.  No acute intracranial abnormality.  2. Stable mild cerebral atrophy and chronic microvascular ischemic changes.   Mr Brain Wo Contrast 09/24/2017  IMPRESSION:  1. Punctate acute/subacute nonhemorrhagic infarct involving the lateral  right thalamus.  2. Moderate generalized atrophy and white matter disease otherwise likely reflects the sequela of chronic microvascular ischemia.  3. Bilateral sphenoid sinus disease.   Mr Maxine Glenn Head Wo Contrast 09/24/2017 IMPRESSION:  1. No large vessel occlusion.  2. Severe left and moderate to severe right P2 stenoses.   IMPRESSION AND PLAN  Yvette Carroll is a 84 y.o. female with PMH of significant of hypertension, CAD, GERD, depression with anxiety, glaucoma, who presents with right-sided numbness and weakness.  1.  Small right thalamic stroke likely from small vessel disease but.this cannot explain Yvette Carroll's subjective right body numbness  MRA showed punctate acute/subacute nonhemorrhagic infarct involving the lateral right thalamus.  Yvette Carroll continues to have persistency of improving numbness in the right side, notably Yvette Carroll right hand, improved dysarthria, but noted to have senile dementia.  We will pursue full TIA workup with carotid Dopplers, echocardiogram to rule out cardioembolic sources.  We will continue secondary stroke prevention with aspirin which has since been changed to Plavix and high-dose atorvastatin 80 mg daily.  We will continue early rehabilitation with PT OT and speech therapy.  Currently recommendations are for Yvette Carroll to discharge to SNF.  Stroke Risk Factors: hyperlipidemia and hypertension Other Stroke Risk Factors: Advanced age, Cigarette smoker, ETOH use Obesity, Body mass index is 22.9 kg/m.  Hx stroke/TIA, Family hx stroke  CHF, CAD, OSA, Migraines  ABCD2 Score: 6   Outstanding Stroke Work-up Studies:     Echocardiogram:                                                    PENDING B/L Carotid U/S:                                                     PENDING EEG:                                                                      SR  PLAN 09/25/2017  Plavix 75 mg started today Continue high-dose atorvastatin 80 mg Frequent neuro checks Telemetry  monitoring PT/OT/SLP Consult PM & Rehab Consult Case Management /MSW Ongoing aggressive stroke risk factor management Yvette Carroll counseled to be compliant with Yvette Carroll/his antithrombotic medications Yvette Carroll counseled on Lifestyle modifications including, Diet, Exercise, and Stress Follow up with [facility] in 6 weeks   MEDICAL ISSUES  MANAGED BY INTERNAL MEDICINE TEAM   DEPRESSION AND ANXIETY  CAD  UNCONTROLLED HYPERTENSION:  HYPERLIPIDEMIA:    Component Value Date/Time   CHOL 155 09/25/2017 0521   TRIG 110 09/25/2017 0521  HDL 49 09/25/2017 0521   CHOLHDL 3.2 09/25/2017 0521   VLDL 22 09/25/2017 0521   LDLCALC 84 09/25/2017 0521   Lab Results  Component Value Date   HGBA1C 5.4 09/25/2017      Hospital day # 0 VTE prophylaxis: Lovenox  Diet : Fall precautions Diet Heart Room service appropriate? Yes; Fluid consistency: Thin     FAMILY UPDATES: No family at bedside  TEAM UPDATES: Standley Brooking, MD STATUS:    Discharge Information   Discharge Stroke Meds:  Please discharge Yvette Carroll on clopidogrel 75 mg daily, Atorvastatin 80mg     Disposition:  Therapy Recs:               NO SLP Recommendations,  SNF  Home Equipment:         PENDING  Follow up Appointments  Follow Up:  Pearson Grippe, MD -PCP Follow up in 1-2 weeks   Case Management aware of need    Assessment & plan discussed with with attending physician and they are in agreement.    Vivia Ewing, NP Stroke Neurology Team 09/25/2017, 3:30 PM  09/25/2017 ATTENDING ASSESSMENT   I have personally examined this Yvette Carroll, reviewed notes, independently viewed imaging studies, participated in medical decision making and plan of care.ROS completed by me personally and pertinent positives fully documented  I have made any additions or clarifications directly to the above note. Agree with note above. Yvette Carroll is a poor historian and cannot reliably state which side Yvette Carroll had numbness but today feels Yvette Carroll  has numbness on the right side. MRI shows small thalamic lacunar infarct which cannot explain Yvette Carroll symptoms. Recommend change aspirin to Plavix for stroke prevention and aggressive risk factor modification. Discussed with Dr. Irene Limbo. Greater than 50% time during this 25 minute visit was spent on counseling and coordination of care about Yvette Carroll lacunar infarct and discussion about stroke prevention and treatment. Neurology to sign-off at this time. Please call with any further questions or concerns. Thank you for this consultation Delia Heady, MD Medical Director [facility] Pager: [phone] 09/25/2017 4:25 PM   .  To contact Stroke Provider, please refer to [email or web address]. After hours, contact General Neurology

## 2017-09-25 NOTE — Progress Notes (Signed)
Patient off floor for carotid ultrasound.

## 2017-09-25 NOTE — Progress Notes (Signed)
  Echocardiogram 2D Echocardiogram has been performed.  Yvette Carroll 09/25/2017, 11:51 AM

## 2017-09-25 NOTE — Care Management Note (Signed)
Case Management Note  Patient Details  Name: Yvette Carroll MRN: [id number] Date of Birth: 03/26/1920  Subjective/Objective:                 Patient from [facility]. CSW following for patient's return at DC.    Action/Plan:   Expected Discharge Date:                  Expected Discharge Plan:  Skilled Nursing Facility  In-House Referral:  Clinical Social Work  Discharge planning Services  CM Consult  Post Acute Care Choice:    Choice offered to:     DME Arranged:    DME Agency:     HH Arranged:    HH Agency:     Status of Service:  Completed, signed off  If discussed at [employer], dates discussed:    Additional Comments:  Lawerance Sabal, RN 09/25/2017, 10:41 AM

## 2017-09-25 NOTE — Evaluation (Addendum)
Occupational Therapy Evaluation Patient Details Name: Yvette Carroll MRN: [id number] DOB: 03/01/1920 Today's Date: 09/25/2017    History of Present Illness 84 y.o. female with medical history significant of hypertension, CAD, GERD, depression with anxiety, glaucoma, who presents with right-sided numbness and weakness. MRI showing punctate acute/subacute nonhemorrhagic infarct involving the lateral right thalamus.   Clinical Impression   PTA, pt reports she was living at [facility] and performed BADLs with assistance as needed and use w/c for functional mobility; unsure of accuracy due to poor historian and notes memory deficits during session. Pt performing self feeding, grooming, and donning socks at bed level with Min A. Pt performed toileting with Mod-Max A for transfer and Min Guard for safety during toilet hygiene. Pt presents with limited right shoulder ROM, which pt reports is from a shoulder injury from fall. Pt presenting near baseline function and recommend return to SNF with follow up OT to optimize safety and independence with ADLs and functional transfers. Acute OT needs met and defer further OT needs to next venue.     Follow Up Recommendations  SNF;Supervision/Assistance - 24 hour    Equipment Recommendations  Other (comment)(Defer to next venue)    Recommendations for Other Services PT consult;Speech consult     Precautions / Restrictions Precautions Precautions: Fall Restrictions Weight Bearing Restrictions: No      Mobility Bed Mobility Overal bed mobility: Needs Assistance Bed Mobility: Supine to Sit     Supine to sit: HOB elevated;Min guard     General bed mobility comments: HOB elevated for trunk support and pt using bedrails to pull hips towards HOB. Min Guard A for safety  Transfers Overall transfer level: Needs assistance   Transfers: Sit to/from [facility] Sit to Stand: Mod assist Stand pivot transfers: Mod assist;Max assist        General transfer comment: Pt performing sit<>stand with Mod A. First stand pivot required Max A to pivot towards BSC and pt with BLE trembling. Mod A for stand pivot to recliner to bring hips towards seat    Balance Overall balance assessment: Needs assistance Sitting-balance support: No upper extremity supported;Feet supported Sitting balance-Leahy Scale: Fair     Standing balance support: Bilateral upper extremity supported;During functional activity Standing balance-Leahy Scale: Poor Standing balance comment: Reliant on physical A                           ADL either performed or assessed with clinical judgement   ADL Overall ADL's : Needs assistance/impaired Eating/Feeding: Minimal assistance;Bed level   Grooming: Wash/dry face;Set up;Bed level;Wash/dry hands   Upper Body Bathing: Minimal assistance;Sitting   Lower Body Bathing: Maximal assistance;Sit to/from stand;+2 for physical assistance   Upper Body Dressing : Minimal assistance;Sitting   Lower Body Dressing: Minimal assistance;Bed level;Maximal assistance Lower Body Dressing Details (indicate cue type and reason): Pt donning left sock while long sitting in bed and HOB elevated for support. Requireding Max A for standing balance Toilet Transfer: Maximal assistance;Squat-pivot;BSC;Moderate assistance Toilet Transfer Details (indicate cue type and reason): First stand pivot to [facility] required Max A due to BLE trembling. Pt performing stand pivot to recliner from [facility] with Mod A and required Max tactile cues for visual deficits to reach for arm rests Toileting- Clothing Manipulation and Hygiene: Sitting/lateral lean;Min guard       Functional mobility during ADLs: Maximal assistance(stand pivot) General ADL Comments: Pt with decreased funcitonal performance. But feel she is near baseline  from her reports. Pt performing self feeding, toileting, and LB dressing.      Vision Baseline Vision/History:  Glaucoma Patient Visual Report: No change from baseline       Perception     Praxis      Pertinent Vitals/Pain Pain Assessment: Faces Faces Pain Scale: Hurts little more Pain Location: Neck Pain Descriptors / Indicators: Discomfort;Grimacing Pain Intervention(s): Monitored during session     Hand Dominance Right   Extremity/Trunk Assessment Upper Extremity Assessment Upper Extremity Assessment: RUE deficits/detail;Generalized weakness RUE Deficits / Details: Pt reports fall (a month ago) which injuried her RUE. Limited shoulder ROM with flexion from 0-80. Able to touch her nose and reach for cup. Unable to bring hand to top of head. Pt using RUE for self feeding. Poor grasp strength but demonstrating descent in hand manipulation to manage eating utensils. RUE Coordination: decreased gross motor   Lower Extremity Assessment Lower Extremity Assessment: Defer to PT evaluation   Cervical / Trunk Assessment Cervical / Trunk Assessment: Kyphotic   Communication Communication Communication: No difficulties   Cognition Arousal/Alertness: Awake/alert Behavior During Therapy: WFL for tasks assessed/performed Overall Cognitive Status: Impaired/Different from baseline Area of Impairment: Memory                     Memory: Decreased short-term memory         General Comments: Pt perseverating on topics. Repeated asking how she got to hospital stating "it just doesnt make sense." Pt able to answer questions, carry conversation, and follow simple commands. Pt very aware of balance deficits   General Comments       Exercises     Shoulder Instructions      Home Living Family/patient expects to be discharged to:: Skilled nursing facility                                 Additional Comments: Pt reporting [facility]      Prior Functioning/Environment Level of Independence: Needs assistance  Gait / Transfers Assistance Needed: w/c for funcitonal  mobility. Pt reports she stand pivots to w/c with RW ADL's / Homemaking Assistance Needed: Reports she does her ADLs with assistance as needed   Comments: Pt poor historian and not family present to confirm information.        OT Problem List: Decreased strength;Decreased range of motion;Decreased activity tolerance;Impaired balance (sitting and/or standing);Impaired vision/perception;Decreased coordination;Decreased cognition;Decreased safety awareness;Decreased knowledge of use of DME or AE;Decreased knowledge of precautions;Pain;Impaired UE functional use      OT Treatment/Interventions:      OT Goals(Current goals can be found in the care plan section) Acute Rehab OT Goals Patient Stated Goal: Return to [facility] OT Goal Formulation: All assessment and education complete, DC therapy  OT Frequency:     Barriers to D/C:            Co-evaluation              AM-PAC PT "6 Clicks" Daily Activity     Outcome Measure Help from another person eating meals?: A Little Help from another person taking care of personal grooming?: A Little Help from another person toileting, which includes using toliet, bedpan, or urinal?: A Lot Help from another person bathing (including washing, rinsing, drying)?: A Lot Help from another person to put on and taking off regular upper body clothing?: A Little Help from another person to put on and  taking off regular lower body clothing?: A Lot 6 Click Score: 15   End of Session Equipment Utilized During Treatment: Gait belt Nurse Communication: Mobility status  Activity Tolerance: Patient tolerated treatment well Patient left: in chair;with call bell/phone within reach;with chair alarm set  OT Visit Diagnosis: Unsteadiness on feet (R26.81);Other abnormalities of gait and mobility (R26.89);Muscle weakness (generalized) (M62.81);Other symptoms and signs involving cognitive function;Pain Pain - part of body: (Neck)                Time:  0277-4128 OT Time Calculation (min): 51 min Charges:  OT General Charges $OT Visit: 1 Visit OT Evaluation $OT Eval Moderate Complexity: 1 Mod OT Treatments $Self Care/Home Management : 23-37 mins G-Codes:     [place], OTR/L Acute Rehab Pager: [phone] Office: [place] 09/25/2017, 9:13 AM

## 2017-09-25 NOTE — Progress Notes (Signed)
PT Cancellation Note  Patient Details Name: Yvette Carroll MRN: [id number] DOB: 01-25-1920   Cancelled Treatment:    Reason Eval/Treat Not Completed: Patient at procedure or test/unavailable. Checked on pt x2 this morning and was unavailable each time. Will continue to follow and initiate PT eval when pt is available.    Marylynn Pearson 09/25/2017, 11:30 AM   Conni Slipper, PT, DPT Acute Rehabilitation Services Pager: [phone]

## 2017-09-26 MED ORDER — ATORVASTATIN CALCIUM 80 MG PO TABS: 80.0000 mg | ORAL_TABLET | Freq: Every day | ORAL | Status: AC

## 2017-09-26 MED ORDER — OXYCODONE HCL 5 MG PO TABS: 5.0000 mg | ORAL_TABLET | Freq: Two times a day (BID) | ORAL | 0 refills | Status: DC | PRN

## 2017-09-26 MED ORDER — ALPRAZOLAM 0.25 MG PO TABS: 0.1250 mg | ORAL_TABLET | Freq: Every day | ORAL | 0 refills | Status: DC

## 2017-09-26 MED ORDER — CLOPIDOGREL BISULFATE 75 MG PO TABS: 75.0000 mg | ORAL_TABLET | Freq: Every day | ORAL | Status: AC

## 2017-09-26 MED ORDER — METOPROLOL TARTRATE 12.5 MG HALF TABLET
12.5000 mg | ORAL_TABLET | Freq: Two times a day (BID) | ORAL | Status: DC
  Administered 2017-09-26: 12.5 mg via ORAL

## 2017-09-26 MED ORDER — OXYCODONE HCL 10 MG PO TABS: 10.0000 mg | ORAL_TABLET | Freq: Two times a day (BID) | ORAL | 0 refills | Status: DC

## 2017-09-26 NOTE — NC FL2 (Addendum)
[place] MEDICAID FL2 LEVEL OF CARE SCREENING TOOL     IDENTIFICATION  Patient Name: CHARNESE OFFEN Birthdate: Feb 04, 1920 Sex: female Admission Date (Current Location): 09/24/2017  [facility] and [place] Number:  Producer, television/film/video and Address:  [facility], [address] [place], [place] [address]      Provider Number: [id number]  Attending Physician Name and Address:  Standley Brooking, MD  Relative Name and Phone Number:       Current Level of Care: Hospital Recommended Level of Care: Assisted Living Facility Prior Approval Number:    Date Approved/Denied:   PASRR Number:    Discharge Plan: Other (Comment)(ALF)    Current Diagnoses: Patient Active Problem List   Diagnosis Date Noted  . Acute CVA (cerebrovascular accident) (HCC) 09/25/2017  . Benign essential HTN 09/25/2017  . Depression with anxiety 09/24/2017  . Acute posthemorrhagic anemia 05/27/2013  . Chronic low back pain 04/30/2013  . Glaucoma 04/30/2013  . Allergic rhinitis 04/30/2013  . GERD (gastroesophageal reflux disease) 04/30/2013  . CAD (coronary artery disease) 04/30/2013  . Constipation 04/30/2013  . Hip fracture (HCC) 04/24/2013  . Anemia 04/24/2013    Orientation RESPIRATION BLADDER Height & Weight     Self, Situation, Time, Place  Normal Continent Weight: 129 lb 13.6 oz (58.9 kg) Height:  5\' 2"  (157.5 cm)  BEHAVIORAL SYMPTOMS/MOOD NEUROLOGICAL BOWEL NUTRITION STATUS      Continent Diet(heart healthy)  AMBULATORY STATUS COMMUNICATION OF NEEDS Skin   Extensive Assist Verbally Normal                       Personal Care Assistance Level of Assistance  Bathing, Dressing Bathing Assistance: Maximum assistance   Dressing Assistance: Maximum assistance     Functional Limitations Info             SPECIAL CARE FACTORS FREQUENCY  PT (By licensed PT), OT (By licensed OT)     PT Frequency: 3/wk with home health OT Frequency: 3/wk with home health             Contractures      Additional Factors Info  Code Status, Allergies Code Status Info: FULL              Discharge Medications: Medication List    STOP taking these medications   lisinopril 2.5 MG tablet Commonly known as:  PRINIVIL,ZESTRIL   OLANZapine 2.5 MG tablet Commonly known as:  ZYPREXA     TAKE these medications   ABREVA 10 % Crea Generic drug:  Docosanol Apply 1 application topically 4 (four) times daily as needed (for fever blisters).   acetaminophen 500 MG tablet Commonly known as:  TYLENOL Take 500 mg by mouth 3 (three) times daily as needed for moderate pain.   ALPRAZolam 0.25 MG tablet Commonly known as:  XANAX Take 0.5 tablets (0.125 mg total) by mouth at bedtime. What changed:  how much to take   antiseptic oral rinse Liqd 15 mLs by Mouth Rinse route 3 (three) times daily. May self administer   atorvastatin 80 MG tablet Commonly known as:  LIPITOR Take 1 tablet (80 mg total) by mouth daily at 6 PM.   BANOPHEN cream Generic drug:  diphenhydrAMINE-zinc acetate Apply 1 application topically daily as needed for itching. Apply topically to chin every day   bimatoprost 0.01 % Soln Commonly known as:  LUMIGAN Place 1 drop into both eyes every evening.   calcium citrate 950 MG  tablet Commonly known as:  CALCITRATE - dosed in mg elemental calcium Take 200 mg of elemental calcium by mouth daily.   cholecalciferol 1000 units tablet Commonly known as:  VITAMIN D Take 1,000 Units by mouth daily.   clopidogrel 75 MG tablet Commonly known as:  PLAVIX Take 1 tablet (75 mg total) by mouth daily. Start taking on:  09/27/2017   DULoxetine 60 MG capsule Commonly known as:  CYMBALTA Take 60 mg by mouth daily.   ENSURE PLUS Liqd Take 237 mLs by mouth 2 (two) times daily.   FIBER LAXATIVE 0.52 g capsule Generic drug:  psyllium Take 0.52 g by mouth at bedtime.   fluticasone 50 MCG/ACT nasal spray Commonly known as:   FLONASE Place 1 spray into both nostrils 2 (two) times daily.   furosemide 20 MG tablet Commonly known as:  LASIX Take 20 mg by mouth daily.   hydrocortisone cream 0.5 % Apply 1 application topically as needed for itching.   lidocaine 5 % Commonly known as:  LIDODERM Place 1 patch onto the skin daily as needed (for knee, hip and back pain). Remove & Discard patch within 12 hours or as directed by MD   loratadine 10 MG tablet Commonly known as:  CLARITIN Take 10 mg by mouth daily as needed (for runny nose or sinus symptoms).   loteprednol 0.5 % ophthalmic suspension Commonly known as:  LOTEMAX Place 1 drop into both eyes 2 (two) times daily.   metoprolol tartrate 25 MG tablet Commonly known as:  LOPRESSOR Take 12.5 mg by mouth 2 (two) times daily.   mirtazapine 15 MG tablet Commonly known as:  REMERON Take 15 mg by mouth at bedtime.   MUSCLE RUB 10-15 % Crea Apply 1 application topically daily as needed for muscle pain.   Oxycodone HCl 10 MG Tabs Take 1 tablet (10 mg total) by mouth 2 (two) times daily. What changed:  Another medication with the same name was removed. Continue taking this medication, and follow the directions you see here.   polyethylene glycol packet Commonly known as:  MIRALAX / GLYCOLAX Take 17 g by mouth daily.   potassium chloride 10 MEQ tablet Commonly known as:  K-DUR,KLOR-CON Take 10 mEq by mouth daily. With Lasix   PRESERVISION AREDS 2 Caps Take 1 capsule by mouth 2 (two) times daily.   ranitidine 150 MG tablet Commonly known as:  ZANTAC Take 150 mg by mouth 2 (two) times daily.   SENEXON-S 8.6-50 MG tablet Generic drug:  senna-docusate Take 1 tablet by mouth at bedtime.   sodium chloride 0.65 % Soln nasal spray Commonly known as:  OCEAN Place 1 spray into both nostrils as needed for congestion.   SYSTANE 0.4-0.3 % Soln Generic drug:  Polyethyl Glycol-Propyl Glycol Place 1 drop into both eyes 4 (four) times daily  as needed (for dry eyes).   timolol 0.25 % ophthalmic solution Commonly known as:  BETIMOL Place 2 drops into both eyes 2 (two) times daily.        Relevant Imaging Results:  Relevant Lab Results:   Additional Information    Burna Sis, LCSW

## 2017-09-26 NOTE — Discharge Summary (Addendum)
Physician Discharge Summary  Yvette Carroll VWU:[id number] DOB: 29-Jul-1919 DOA: 09/24/2017  PCP: Pearson Grippe, MD  Admit date: 09/24/2017 Discharge date: 09/26/2017  Recommendations for Outpatient Follow-up:  Nonhemorrhagic infarct lateral right thalamus.  --Continue clopidogrel, atorvastatin.    Follow-up with [facility] neurology stroke clinic 6 weeks. PT, OT at SNF.  Moderate to severe aortic stenosis on echocardiogram.  Incidental finding.  No previous echo available for comparison. --Asymptomatic.    Consider follow-up as an outpatient.   Follow-up Information    [facility] Follow up in 4 week(s).   Specialty:  Neurology Why:  office will call with appt date and time Contact information: [address] [place] [address] [phone]       Pearson Grippe, MD. Schedule an appointment as soon as possible for a visit in 1 week(s).   Specialty:  Internal Medicine Contact information: [address] [place] [address] [place] [address] [phone]            Discharge Diagnoses:  1. Nonhemorrhagic infarct lateral right thalamus  2. Moderate to severe aortic stenosis on echocardiogram 3. Essential hypertension 4. Coronary artery disease 5. Depression, anxiety 6. CKD stage III  Discharge Condition: improved Disposition: SNF  Diet recommendation: heart healthy  Filed Weights   09/24/17 2151 09/26/17 0400  Weight: 56.8 kg (125 lb 3.5 oz) 58.9 kg (129 lb 13.6 oz)    History of present illness:  84 year old woman PMH essential hypertension, coronary artery disease presented with right-sided numbness and weakness right hand and leg. Uncertain duration.  MRI showed punctate acute/subacute nonhemorrhagic infarct lateral right thalamus.  Admitted for further evaluation of stroke.  Hospital Course:  Patient underwent full stroke evaluation, seen by neurology in consultation.  Her stroke did not correlate with her symptoms which  were somewhat vague.  In discussion with neurology, recommendations as below.  No focal deficits are noted.  Individual issues as below.  Nonhemorrhagic infarct lateral right thalamus.  Occupational therapy recommended SNF.  Speech therapy recommended no further evaluation or treatment.  Bilateral carotid ultrasound 1-39% stenosis.  Bilateral vertebral artery antegrade flow.  Echocardiogram did show aortic stenosis. --Discussed with Dr. Pearlean Brownie today.  Continue clopidogrel, atorvastatin.    Follow-up with [facility] neurology stroke clinic 6 weeks.  Moderate to severe aortic stenosis on echocardiogram.  Incidental finding.  No previous echo available for comparison. --Asymptomatic.    Recommend follow-up as an outpatient.  Essential hypertension --Stable.  Coronary artery disease --Stable.    Continue clopidogrel, Lipitor  Depression, anxiety --Stable.  Continue Seroquel, Remeron, Xanax as needed   Consultants:  Neurology   Procedures:  Echo Study Conclusions  - Left ventricle: The cavity size was normal. Wall thickness was increased in a pattern of moderate LVH. There was focal basal hypertrophy. Systolic function was vigorous. The estimated ejection fraction was in the range of 65% to 70%. Wall motion was normal; there were no regional wall motion abnormalities. Doppler parameters are consistent with abnormal left ventricular relaxation (grade 1 diastolic dysfunction). - Aortic valve: Moderately calcified annulus. Moderately thickened, moderately calcified leaflets. There was moderate to severe stenosis. There was mild regurgitation. Valve area (VTI): 1.13 cm^2. Valve area (Vmax): 1.23 cm^2. Valve area (Vmean): 1.1 cm^2. - Mitral valve: Mildly to moderately calcified annulus.  Impressions:  - vigorous LV systolic function the Aortic stenosis is at least moderate - severe . perhaps severe. The measured AV gradient may also have a component of  dynamic LV obstruction which could overestimate the  severity of the AS  Today's assessment: S: Feels okay today.  No hand numbness now.  No weakness. O: Vitals:  Vitals:   09/26/17 0400 09/26/17 0828  BP: 138/61 (!) 193/84  Pulse: 85 97  Resp: 18   Temp: 97.7 F (36.5 C) 98.7 F (37.1 C)  SpO2: 97% 96%    Constitutional:  . Appears calm and comfortable, sitting in chair eating lunch. ENMT:  . grossly normal hearing  Respiratory:  . CTA bilaterally, no w/r/r.  . Respiratory effort normal Cardiovascular:  . RRR, 2/6 systolic murmur.  No rub or gallop. . No LE extremity edema   Musculoskeletal:  . RUE, LUE, RLE, LLE   . strength and tone grossly normal. Neurologic:  . Cranial nerves appear grossly intact. Psychiatric:  . Mental status o Mood, affect appropriate  Discharge Instructions  Discharge Instructions    Ambulatory referral to Neurology   Complete by:  As directed    Follow up with stroke clinic NP (Jessica Vanschaick or Darrol Angel, if both not available, consider Dr. Delia Heady, Dr. Jamelle Rushing, or Dr. Naomie Dean) at [facility] in about 4 weeks.     Allergies as of 09/26/2017      Reactions   Lisinopril Swelling   Iodine Other (See Comments)   On MAR   Macrolides And Ketolides Other (See Comments)   Per MAR   Penicillins Swelling   Risperdal [risperidone] Other (See Comments)   Per MAR   Shellfish Allergy Other (See Comments)   Unknown   Betadine [povidone Iodine] Itching, Rash     Allergies as of 09/26/2017      Reactions   Lisinopril Swelling   Iodine Other (See Comments)   On MAR   Macrolides And Ketolides Other (See Comments)   Per MAR   Penicillins Swelling   Risperdal [risperidone] Other (See Comments)   Per MAR   Shellfish Allergy Other (See Comments)   Unknown   Betadine [povidone Iodine] Itching, Rash      Medication List    STOP taking these medications   lisinopril 2.5 MG tablet Commonly  known as:  PRINIVIL,ZESTRIL   OLANZapine 2.5 MG tablet Commonly known as:  ZYPREXA     TAKE these medications   ABREVA 10 % Crea Generic drug:  Docosanol Apply 1 application topically 4 (four) times daily as needed (for fever blisters).   acetaminophen 500 MG tablet Commonly known as:  TYLENOL Take 500 mg by mouth 3 (three) times daily as needed for moderate pain.   ALPRAZolam 0.25 MG tablet Commonly known as:  XANAX Take 0.5 tablets (0.125 mg total) by mouth at bedtime. What changed:  how much to take   antiseptic oral rinse Liqd 15 mLs by Mouth Rinse route 3 (three) times daily. May self administer   atorvastatin 80 MG tablet Commonly known as:  LIPITOR Take 1 tablet (80 mg total) by mouth daily at 6 PM.   BANOPHEN cream Generic drug:  diphenhydrAMINE-zinc acetate Apply 1 application topically daily as needed for itching. Apply topically to chin every day   bimatoprost 0.01 % Soln Commonly known as:  LUMIGAN Place 1 drop into both eyes every evening.   calcium citrate 950 MG tablet Commonly known as:  CALCITRATE - dosed in mg elemental calcium Take 200 mg of elemental calcium by mouth daily.   cholecalciferol 1000 units tablet Commonly known as:  VITAMIN D Take 1,000 Units by mouth daily.   clopidogrel 75 MG tablet Commonly known  as:  PLAVIX Take 1 tablet (75 mg total) by mouth daily. Start taking on:  09/27/2017   DULoxetine 60 MG capsule Commonly known as:  CYMBALTA Take 60 mg by mouth daily.   ENSURE PLUS Liqd Take 237 mLs by mouth 2 (two) times daily.   FIBER LAXATIVE 0.52 g capsule Generic drug:  psyllium Take 0.52 g by mouth at bedtime.   fluticasone 50 MCG/ACT nasal spray Commonly known as:  FLONASE Place 1 spray into both nostrils 2 (two) times daily.   furosemide 20 MG tablet Commonly known as:  LASIX Take 20 mg by mouth daily.   hydrocortisone cream 0.5 % Apply 1 application topically as needed for itching.   lidocaine 5 % Commonly  known as:  LIDODERM Place 1 patch onto the skin daily as needed (for knee, hip and back pain). Remove & Discard patch within 12 hours or as directed by MD   loratadine 10 MG tablet Commonly known as:  CLARITIN Take 10 mg by mouth daily as needed (for runny nose or sinus symptoms).   loteprednol 0.5 % ophthalmic suspension Commonly known as:  LOTEMAX Place 1 drop into both eyes 2 (two) times daily.   metoprolol tartrate 25 MG tablet Commonly known as:  LOPRESSOR Take 12.5 mg by mouth 2 (two) times daily.   mirtazapine 15 MG tablet Commonly known as:  REMERON Take 15 mg by mouth at bedtime.   MUSCLE RUB 10-15 % Crea Apply 1 application topically daily as needed for muscle pain.   Oxycodone HCl 10 MG Tabs Take 1 tablet (10 mg total) by mouth 2 (two) times daily. What changed:  Another medication with the same name was removed. Continue taking this medication, and follow the directions you see here.   polyethylene glycol packet Commonly known as:  MIRALAX / GLYCOLAX Take 17 g by mouth daily.   potassium chloride 10 MEQ tablet Commonly known as:  K-DUR,KLOR-CON Take 10 mEq by mouth daily. With Lasix   PRESERVISION AREDS 2 Caps Take 1 capsule by mouth 2 (two) times daily.   ranitidine 150 MG tablet Commonly known as:  ZANTAC Take 150 mg by mouth 2 (two) times daily.   SENEXON-S 8.6-50 MG tablet Generic drug:  senna-docusate Take 1 tablet by mouth at bedtime.   sodium chloride 0.65 % Soln nasal spray Commonly known as:  OCEAN Place 1 spray into both nostrils as needed for congestion.   SYSTANE 0.4-0.3 % Soln Generic drug:  Polyethyl Glycol-Propyl Glycol Place 1 drop into both eyes 4 (four) times daily as needed (for dry eyes).   timolol 0.25 % ophthalmic solution Commonly known as:  BETIMOL Place 2 drops into both eyes 2 (two) times daily.        Allergies  Allergen Reactions  . Lisinopril Swelling  . Iodine Other (See Comments)    On MAR  . Macrolides And  Ketolides Other (See Comments)    Per MAR  . Penicillins Swelling  . Risperdal [Risperidone] Other (See Comments)    Per MAR  . Shellfish Allergy Other (See Comments)    Unknown  . Betadine [Povidone Iodine] Itching and Rash    The results of significant diagnostics from this hospitalization (including imaging, microbiology, ancillary and laboratory) are listed below for reference.    Significant Diagnostic Studies: Ct Head Wo Contrast  Result Date: 09/24/2017 CLINICAL DATA:  Right hand weakness.  Difficulty with speech. EXAM: CT HEAD WITHOUT CONTRAST TECHNIQUE: Contiguous axial images were obtained from the base of  the skull through the vertex without intravenous contrast. COMPARISON:  CT head dated May 25, 2014. FINDINGS: Brain: No evidence of acute infarction, hemorrhage, hydrocephalus, extra-axial collection or mass lesion/mass effect. Stable mild cerebral atrophy and chronic microvascular ischemic changes. Unchanged lacunar infarct in the left basal ganglia. Vascular: Calcified atherosclerosis at the skullbase. No hyperdense vessel. Skull: Negative for fracture or focal lesion. Sinuses/Orbits: No acute finding. Other: None. IMPRESSION: 1.  No acute intracranial abnormality. 2. Stable mild cerebral atrophy and chronic microvascular ischemic changes. Electronically Signed   By: Obie Dredge M.D.   On: 09/24/2017 14:03   Mr Brain Wo Contrast  Result Date: 09/24/2017 CLINICAL DATA:  New onset of right hand numbness. Last known well was before going to bed last night. EXAM: MRI HEAD WITHOUT CONTRAST TECHNIQUE: Multiplanar, multiecho pulse sequences of the brain and surrounding structures were obtained without intravenous contrast. COMPARISON:  CT head without contrast 09/24/2017 FINDINGS: Brain: The diffusion-weighted images demonstrate acute nonhemorrhagic punctate infarct involving the lateral right thalamus. T2 changes are associated, compatible with a subacute time frame. No other  acute infarct is present. No acute hemorrhage or mass lesion is present. Periventricular and subcortical T2 hyperintensities are present bilaterally. White matter changes extend into the brainstem. The cerebellum is unremarkable. The internal auditory canals are within normal limits bilaterally. Vascular: Flow is present in the major intracranial arteries. Skull and upper cervical spine: Skull base is within normal limits. The craniocervical junction is normal. Mild degenerative anterolisthesis is present at C3-4 and C4-5. Vertebral body heights and marrow signal are normal. Sinuses/Orbits: Fluid levels are present within the sphenoid sinus bilaterally. The paranasal sinuses are otherwise clear. Fluid is present in the mastoid air cells bilaterally. No obstructing nasopharyngeal lesion is evident. IMPRESSION: 1. Punctate acute/subacute nonhemorrhagic infarct involving the lateral right thalamus. 2. Moderate generalized atrophy and white matter disease otherwise likely reflects the sequela of chronic microvascular ischemia. 3. Bilateral sphenoid sinus disease. These results were called by telephone at the time of interpretation on 09/24/2017 at 6:05 pm to Dr. Jacqulyn Bath, who verbally acknowledged these results. Electronically Signed   By: Marin Roberts M.D.   On: 09/24/2017 18:11   Mr Maxine Glenn Head Wo Contrast  Result Date: 09/24/2017 CLINICAL DATA:  Acute right thalamic lacunar infarct on brain MRI today. EXAM: MRA HEAD WITHOUT CONTRAST TECHNIQUE: Angiographic images of the Circle of Willis were obtained using MRA technique without intravenous contrast. COMPARISON:  Brain MRI today.  No prior angiographic imaging. FINDINGS: The visualized distal vertebral arteries are patent to the basilar with the left being dominant. P ICAs are grossly patent. There may be a high-grade proximal left AICA stenosis. SCA is are patent. The basilar artery is widely patent. There is a fetal origin of the right PCA, and there are at  least moderate if not severe tandem stenoses of the proximal to mid right P2 segment. The left PCA is patent with a severe mid left P2 stenosis, and there is asymmetric attenuation of the left PCA more distally. The internal carotid arteries are widely patent from skull base to carotid termini. ACAs and MCAs are patent without evidence of proximal branch occlusion or significant proximal stenosis. No aneurysm is identified. IMPRESSION: 1. No large vessel occlusion. 2. Severe left and moderate to severe right P2 stenoses. Electronically Signed   By: Sebastian Ache M.D.   On: 09/24/2017 21:24    Microbiology: Recent Results (from the past 240 hour(s))  MRSA PCR Screening     Status: Abnormal  Collection Time: 09/25/17  2:24 PM  Result Value Ref Range Status   MRSA by PCR POSITIVE (A) NEGATIVE Final    Comment:        The GeneXpert MRSA Assay (FDA approved for NASAL specimens only), is one component of a comprehensive MRSA colonization surveillance program. It is not intended to diagnose MRSA infection nor to guide or monitor treatment for MRSA infections. RESULT CALLED TO, READ BACK BY AND VERIFIED WITH: S MCCLAIN,RN 09/25/17 14:30 BY C MORARU Performed at [facility], [address], [place], [place] [address]      Labs: Basic Metabolic Panel: Recent Labs  Lab 09/24/17 1328  NA 137  K 4.2  CL 100*  CO2 28  GLUCOSE 142*  BUN 19  CREATININE 0.96  CALCIUM 8.6*   CBC: Recent Labs  Lab 09/24/17 1328  WBC 5.6  HGB 11.7*  HCT 37.8  MCV 95.9  PLT 237   Cardiac Enzymes: Recent Labs  Lab 09/24/17 2159  TROPONINI <0.03    Principal Problem:   Acute CVA (cerebrovascular accident) (HCC) Active Problems:   CAD (coronary artery disease)   Depression with anxiety   Benign essential HTN   Time coordinating discharge: 35 minutes  Signed:  Brendia Sacks, MD [facility] 09/26/2017, 12:20 PM

## 2017-09-26 NOTE — Progress Notes (Signed)
Patient discharged to [facility] via [facility].

## 2017-09-26 NOTE — Progress Notes (Signed)
Dr. Irene Limbo notified about patient having a run of SVT. He will restart her metoprolol and is ok to still discharge as long as patient is in SR and heart rate less than 110.

## 2017-09-26 NOTE — Progress Notes (Signed)
NEUROHOSPITALISTS STROKE TEAM - DAILY PROGRESS NOTE   ADMISSION HISTORY: Yvette Carroll is a 84 y.o. female with medical history significant of hypertension, CAD, GERD, depression with anxiety, glaucoma, who presents with right-sided numbness and weakness. It seems pt started having right sided numbness and weakness since lunch time today per report. She states that she has numbness to her right hand and right leg, which has improved currently. She could not tell the exact time that her symptoms started. She does not have facial droop, slurred speech. Patient denies chest pain, shortness breath, cough. No nausea, vomiting, diarrhea, abdominal pain, symptoms of UTI. No fever or chills.   SUBJECTIVE (INTERVAL HISTORY) Patient in bed,  And  states she had right-sided numbness especially in her arm   Admits to improving right arm weakness.carotid ultrasound and transthoracic echo have both been unremarkable except for moderate aortic stenosis which is an incidental finding  Laboratory Results  CBC:  Recent Labs  Lab 09/24/17 1328  WBC 5.6  HGB 11.7*  HCT 37.8  MCV 95.9  PLT 237   BMP: Recent Labs  Lab 09/24/17 1328  NA 137  K 4.2  CL 100*  CO2 28  GLUCOSE 142*  BUN 19  CREATININE 0.96  CALCIUM 8.6*    Cardiac Enzymes:  Recent Labs  Lab 09/24/17 2159  TROPONINI <0.03   Urinalysis:  Recent Labs  Lab 09/25/17 0100  COLORURINE YELLOW  APPEARANCEUR CLEAR  LABSPEC 1.011  PHURINE 7.0  GLUCOSEU NEGATIVE  HGBUR NEGATIVE  BILIRUBINUR NEGATIVE  KETONESUR NEGATIVE  PROTEINUR NEGATIVE  NITRITE NEGATIVE  LEUKOCYTESUR TRACE*    Physical Examination   Vitals:   09/25/17 2028 09/25/17 2340 09/26/17 0400 09/26/17 0828  BP: (!) 120/58 (!) 131/48 138/61 (!) 193/84  Pulse: 67 82 85 97  Resp: 18 18 18    Temp: 97.8 F (36.6 C) 97.8 F (36.6 C) 97.7 F (36.5 C) 98.7 F (37.1 C)  TempSrc: Oral Axillary Axillary Oral  SpO2:  96% 95% 97% 96%  Weight:   129 lb 13.6 oz (58.9 kg)   Height:       General - Well nourished, well developed, in no apparent distress HEENT-  Normocephalic,  Cardiovascular - Regular rate and rhythm  Respiratory - Lungs clear bilaterally. No wheezing. Abdomen - soft and non-tender, BS normal Extremities- no edema or cyanosis  Neurological Examination  Mental Status -   Possible underlying senile dementia,  level of arousal and orientation to time, place, and person were intact. Mild dysarthria Attention span and concentration were normal Recent and remote memory were intact Fund of Knowledge was assessed and was intact Cranial Nerves II - XII - II - Visual field intact OU III, IV, VI - Extraocular movements intact. V - Facial sensation intact bilaterally. VII - Facial movement intact bilaterally VIII - Hearing & vestibular intact bilaterally X - Palate elevates symmetrically XI - Chin turning & shoulder shrug intact bilaterally. XII - Tongue protrusion intact Motor Strength - The patient's strength was normal in all extremities and pronator drift was absent.  Bulk was normal and fasciculations were absent . Intermittent hiccups and tremors Motor Tone - Muscle tone was assessed at the neck and appendages and was normal Reflexes - The patient's reflexes were symmetrical in all extremities and she had no pathological reflexes Sensory - Light touch was assessed and was symmetrical Coordination - The patient had normal movements in the hands and feet with no ataxia or dysmetria.  Tremor was absent Gait  and Station - deferred.  Imaging Results  Ct Head Wo Contrast 09/24/2017 IMPRESSION:  1.  No acute intracranial abnormality.  2. Stable mild cerebral atrophy and chronic microvascular ischemic changes.   Mr Brain Wo Contrast 09/24/2017  IMPRESSION:  1. Punctate acute/subacute nonhemorrhagic infarct involving the lateral right thalamus.  2. Moderate generalized atrophy and white  matter disease otherwise likely reflects the sequela of chronic microvascular ischemia.  3. Bilateral sphenoid sinus disease.   Mr Maxine Glenn Head Wo Contrast 09/24/2017 IMPRESSION:  1. No large vessel occlusion.  2. Severe left and moderate to severe right P2 stenoses.   IMPRESSION AND PLAN  Ms. DENIYAH Carroll is a 84 y.o. female with PMH of significant of hypertension, CAD, GERD, depression with anxiety, glaucoma, who presents with right-sided numbness and weakness.  1.  Small right thalamic stroke likely from small vessel disease but.this cannot explain patient's subjective right body numbness  MRA showed punctate acute/subacute nonhemorrhagic infarct involving the lateral right thalamus.  Patient continues to have persistency of improving numbness in the right side, notably her right hand, improved dysarthria, but noted to have senile dementia.  We will pursue full TIA workup with carotid Dopplers, echocardiogram to rule out cardioembolic sources.  We will continue secondary stroke prevention with aspirin which has since been changed to Plavix and high-dose atorvastatin 80 mg daily.  We will continue early rehabilitation with PT OT and speech therapy.  Currently recommendations are for patient to discharge to SNF.  Stroke Risk Factors: hyperlipidemia and hypertension Other Stroke Risk Factors: Advanced age, Cigarette smoker, ETOH use Obesity, Body mass index is 23.75 kg/m.  Hx stroke/TIA, Family hx stroke  CHF, CAD, OSA, Migraines  ABCD2 Score: 6         Echocardiogram:                                                    Left ventricle: The cavity size was normal. Wall thickness was increased in a pattern of moderate LVH. There was focal basalhypertrophy. Systolic function was vigorous. The estimated ejection fraction was in the range of 65% to 70%. Wall motion was normal; there were no regional wall motion abnormalities.Aortic valve: Moderately calcified annulus. Moderately thickened,moderately  calcified leaflets. There was moderate to severestenosis.      B/L Carotid U/S:                                                     Right Carotid: Velocities in the right ICA are consistent with a 1-39% stenosis.  Left Carotid: Velocities in the left ICA are consistent with a 1-39% stenosis.  Vertebrals: Bilateral vertebral arteries demonstrate antegrade flow                                                                        SR  PLAN 09/26/2017  Plavix 75 mg started today Continue high-dose atorvastatin 80 mg Frequent neuro checks Telemetry  monitoring PT/OT/SLP Consult PM & Rehab Consult Case Management /MSW Ongoing aggressive stroke risk factor management Patient counseled to be compliant with her/his antithrombotic medications Patient counseled on Lifestyle modifications including, Diet, Exercise, and Stress Follow up with [facility] in 6 weeks   MEDICAL ISSUES  MANAGED BY INTERNAL MEDICINE TEAM   DEPRESSION AND ANXIETY  CAD  UNCONTROLLED HYPERTENSION:  HYPERLIPIDEMIA:    Component Value Date/Time   CHOL 155 09/25/2017 0521   TRIG 110 09/25/2017 0521   HDL 49 09/25/2017 0521   CHOLHDL 3.2 09/25/2017 0521   VLDL 22 09/25/2017 0521   LDLCALC 84 09/25/2017 0521   Lab Results  Component Value Date   HGBA1C 5.4 09/25/2017      Hospital day # 1 VTE prophylaxis: Lovenox  Diet : Fall precautions Diet Heart Room service appropriate? Yes; Fluid consistency: Thin     FAMILY UPDATES: No family at bedside  TEAM UPDATES: Standley Brooking, MD STATUS:    Discharge Information   Discharge Stroke Meds:  Please discharge patient on clopidogrel 75 mg daily, Atorvastatin 80mg     Disposition: Discharge disposition: 03-Skilled Nursing Facility      Therapy Recs:               NO SLP Recommendations,  SNF  Home Equipment:         PENDING  Follow up Appointments  Follow Up:  Follow-up Information    [facility] Follow  up in 4 week(s).   Specialty:  Neurology Why:  office will call with appt date and time Contact information: [address] [place] [address] [phone]       Pearson Grippe, MD. Schedule an appointment as soon as possible for a visit in 1 week(s).   Specialty:  Internal Medicine Contact information: [address] [place] [address] [phone]          Pearson Grippe, MD -PCP Follow up in 1-2 weeks   Case Management aware of need    Assessment & plan discussed with with attending physician and they are in agreement.    Delia Heady, NP Stroke Neurology Team 09/26/2017, 12:22 PM  09/26/2017 ATTENDING ASSESSMENT   I have personally examined this patient, reviewed notes,  . Recommend continue Plavix for stroke prevention and aggressive risk factor modification. Discussed with Dr. Irene Limbo. Neurology to sign-off at this time. Please call with any further questions or concerns. Thank you for this consultation Delia Heady, MD Medical Director [facility] Pager: [phone] 09/26/2017 12:22 PM   .  To contact Stroke Provider, please refer to [email or web address]. After hours, contact General Neurology

## 2017-09-26 NOTE — Care Management Note (Signed)
Case Management Note  Patient Details  Name: Yvette Carroll MRN: [id number] Date of Birth: 02-27-1920  Subjective/Objective:                    Action/Plan: Pt discharging back to [facility] today. CM signing off.   Expected Discharge Date:  09/26/17               Expected Discharge Plan:  Assisted Living / Rest Home  In-House Referral:  Clinical Social Work  Discharge planning Services  CM Consult  Post Acute Care Choice:    Choice offered to:     DME Arranged:    DME Agency:     HH Arranged:    HH Agency:     Status of Service:  Completed, signed off  If discussed at [employer], dates discussed:    Additional Comments:  Kermit Balo, RN 09/26/2017, 3:16 PM

## 2017-09-26 NOTE — Progress Notes (Signed)
Richard (patient's son) notified about patient being discharged.

## 2017-11-07 ENCOUNTER — Ambulatory Visit: Payer: Medicare Other | Admitting: Adult Health

## 2017-11-11 ENCOUNTER — Encounter: Payer: Self-pay | Admitting: Adult Health

## 2018-03-31 ENCOUNTER — Inpatient Hospital Stay (HOSPITAL_COMMUNITY)
Admission: EM | Admit: 2018-03-31 | Discharge: 2018-04-02 | DRG: 556 | Disposition: A | Discharge status: Nursing Home (Includes ICF) | Attending: Internal Medicine | Admitting: Internal Medicine

## 2018-03-31 ENCOUNTER — Emergency Department (HOSPITAL_COMMUNITY)

## 2018-03-31 ENCOUNTER — Encounter (HOSPITAL_COMMUNITY): Payer: Self-pay | Admitting: Neurology

## 2018-03-31 DIAGNOSIS — Z91013 Allergy to seafood: Secondary | ICD-10-CM

## 2018-03-31 DIAGNOSIS — M549 Dorsalgia, unspecified: Secondary | ICD-10-CM

## 2018-03-31 DIAGNOSIS — I2510 Atherosclerotic heart disease of native coronary artery without angina pectoris: Secondary | ICD-10-CM | POA: Diagnosis present

## 2018-03-31 DIAGNOSIS — I428 Other cardiomyopathies: Secondary | ICD-10-CM | POA: Diagnosis present

## 2018-03-31 DIAGNOSIS — Z66 Do not resuscitate: Secondary | ICD-10-CM | POA: Diagnosis present

## 2018-03-31 DIAGNOSIS — I350 Nonrheumatic aortic (valve) stenosis: Secondary | ICD-10-CM | POA: Diagnosis not present

## 2018-03-31 DIAGNOSIS — W010XXA Fall on same level from slipping, tripping and stumbling without subsequent striking against object, initial encounter: Secondary | ICD-10-CM | POA: Diagnosis present

## 2018-03-31 DIAGNOSIS — Z96643 Presence of artificial hip joint, bilateral: Secondary | ICD-10-CM | POA: Diagnosis present

## 2018-03-31 DIAGNOSIS — K219 Gastro-esophageal reflux disease without esophagitis: Secondary | ICD-10-CM | POA: Diagnosis not present

## 2018-03-31 DIAGNOSIS — Z880 Allergy status to penicillin: Secondary | ICD-10-CM

## 2018-03-31 DIAGNOSIS — I10 Essential (primary) hypertension: Secondary | ICD-10-CM | POA: Diagnosis present

## 2018-03-31 DIAGNOSIS — M810 Age-related osteoporosis without current pathological fracture: Secondary | ICD-10-CM | POA: Diagnosis present

## 2018-03-31 DIAGNOSIS — Z23 Encounter for immunization: Secondary | ICD-10-CM

## 2018-03-31 DIAGNOSIS — W19XXXA Unspecified fall, initial encounter: Secondary | ICD-10-CM | POA: Diagnosis not present

## 2018-03-31 DIAGNOSIS — Z515 Encounter for palliative care: Secondary | ICD-10-CM

## 2018-03-31 DIAGNOSIS — H409 Unspecified glaucoma: Secondary | ICD-10-CM | POA: Diagnosis present

## 2018-03-31 DIAGNOSIS — F418 Other specified anxiety disorders: Secondary | ICD-10-CM | POA: Diagnosis present

## 2018-03-31 DIAGNOSIS — I69391 Dysphagia following cerebral infarction: Secondary | ICD-10-CM

## 2018-03-31 DIAGNOSIS — J309 Allergic rhinitis, unspecified: Secondary | ICD-10-CM | POA: Diagnosis present

## 2018-03-31 DIAGNOSIS — Z90710 Acquired absence of both cervix and uterus: Secondary | ICD-10-CM

## 2018-03-31 DIAGNOSIS — M25551 Pain in right hip: Principal | ICD-10-CM | POA: Diagnosis present

## 2018-03-31 DIAGNOSIS — G8929 Other chronic pain: Secondary | ICD-10-CM

## 2018-03-31 DIAGNOSIS — Z91048 Other nonmedicinal substance allergy status: Secondary | ICD-10-CM

## 2018-03-31 DIAGNOSIS — F329 Major depressive disorder, single episode, unspecified: Secondary | ICD-10-CM | POA: Diagnosis present

## 2018-03-31 DIAGNOSIS — Z79899 Other long term (current) drug therapy: Secondary | ICD-10-CM

## 2018-03-31 DIAGNOSIS — E785 Hyperlipidemia, unspecified: Secondary | ICD-10-CM | POA: Diagnosis present

## 2018-03-31 DIAGNOSIS — Z993 Dependence on wheelchair: Secondary | ICD-10-CM

## 2018-03-31 DIAGNOSIS — Z888 Allergy status to other drugs, medicaments and biological substances status: Secondary | ICD-10-CM

## 2018-03-31 DIAGNOSIS — Z7189 Other specified counseling: Secondary | ICD-10-CM

## 2018-03-31 DIAGNOSIS — R1310 Dysphagia, unspecified: Secondary | ICD-10-CM | POA: Diagnosis present

## 2018-03-31 DIAGNOSIS — Z7951 Long term (current) use of inhaled steroids: Secondary | ICD-10-CM

## 2018-03-31 LAB — CBC WITH DIFFERENTIAL/PLATELET
ABS IMMATURE GRANULOCYTES: 0.1 10*3/uL (ref 0.0–0.1)
BASOS PCT: 1 %
Basophils Absolute: 0.1 10*3/uL (ref 0.0–0.1)
EOS ABS: 0.3 10*3/uL (ref 0.0–0.7)
Eosinophils Relative: 5 %
HEMATOCRIT: 38.9 % (ref 36.0–46.0)
Hemoglobin: 12 g/dL (ref 12.0–15.0)
IMMATURE GRANULOCYTES: 1 %
LYMPHS ABS: 0.8 10*3/uL (ref 0.7–4.0)
Lymphocytes Relative: 13 %
MCH: 29.1 pg (ref 26.0–34.0)
MCHC: 30.8 g/dL (ref 30.0–36.0)
MCV: 94.4 fL (ref 78.0–100.0)
MONO ABS: 0.7 10*3/uL (ref 0.1–1.0)
MONOS PCT: 11 %
NEUTROS ABS: 4.2 10*3/uL (ref 1.7–7.7)
Neutrophils Relative %: 69 %
PLATELETS: 262 10*3/uL (ref 150–400)
RBC: 4.12 MIL/uL (ref 3.87–5.11)
RDW: 15 % (ref 11.5–15.5)
WBC: 6.1 10*3/uL (ref 4.0–10.5)

## 2018-03-31 LAB — I-STAT TROPONIN, ED: Troponin i, poc: 0.01 ng/mL (ref 0.00–0.08)

## 2018-03-31 LAB — URINALYSIS, ROUTINE W REFLEX MICROSCOPIC
Bilirubin Urine: NEGATIVE
Glucose, UA: NEGATIVE mg/dL
HGB URINE DIPSTICK: NEGATIVE
Ketones, ur: NEGATIVE mg/dL
LEUKOCYTES UA: NEGATIVE
Nitrite: NEGATIVE
PH: 8 (ref 5.0–8.0)
PROTEIN: NEGATIVE mg/dL
Specific Gravity, Urine: 1.008 (ref 1.005–1.030)

## 2018-03-31 LAB — BASIC METABOLIC PANEL
Anion gap: 12 (ref 5–15)
BUN: 14 mg/dL (ref 8–23)
CALCIUM: 9.3 mg/dL (ref 8.9–10.3)
CHLORIDE: 99 mmol/L (ref 98–111)
CO2: 28 mmol/L (ref 22–32)
Creatinine, Ser: 0.88 mg/dL (ref 0.44–1.00)
GFR calc Af Amer: >60 mL/min (ref >60)
GFR calc non Af Amer: 54 mL/min — ABNORMAL LOW (ref >60)
Glucose, Bld: 106 mg/dL — ABNORMAL HIGH (ref 70–99)
POTASSIUM: 4.2 mmol/L (ref 3.5–5.1)
SODIUM: 139 mmol/L (ref 135–145)

## 2018-03-31 LAB — MRSA PCR SCREENING: MRSA by PCR: POSITIVE — AB

## 2018-03-31 MED ORDER — OXYCODONE HCL 5 MG PO TABS
10.0000 mg | ORAL_TABLET | Freq: Two times a day (BID) | ORAL | Status: DC
  Administered 2018-03-31 – 2018-04-02 (×4): 10 mg via ORAL
  Filled 2018-03-31 (×4): qty 2

## 2018-03-31 MED ORDER — POTASSIUM CHLORIDE CRYS ER 20 MEQ PO TBCR
10.0000 meq | EXTENDED_RELEASE_TABLET | Freq: Every day | ORAL | Status: DC
  Administered 2018-04-01 – 2018-04-02 (×2): 10 meq via ORAL
  Filled 2018-03-31 (×2): qty 1

## 2018-03-31 MED ORDER — CALCIUM CITRATE 950 (200 CA) MG PO TABS
200.0000 mg | ORAL_TABLET | Freq: Every day | ORAL | Status: DC
  Administered 2018-04-01 – 2018-04-02 (×2): 200 mg via ORAL
  Filled 2018-03-31 (×2): qty 1

## 2018-03-31 MED ORDER — OXYCODONE-ACETAMINOPHEN 5-325 MG PO TABS
1.0000 | ORAL_TABLET | Freq: Once | ORAL | Status: AC
Start: 2018-03-31 — End: 2018-03-31
  Administered 2018-03-31: 1 via ORAL
  Filled 2018-03-31: qty 1

## 2018-03-31 MED ORDER — SALINE SPRAY 0.65 % NA SOLN: 1.0000 | NASAL | Status: DC | PRN

## 2018-03-31 MED ORDER — POLYETHYLENE GLYCOL 3350 17 G PO PACK
17.0000 g | PACK | Freq: Every day | ORAL | Status: DC
  Administered 2018-04-01 – 2018-04-02 (×2): 17 g via ORAL
  Filled 2018-03-31 (×2): qty 1

## 2018-03-31 MED ORDER — CLOPIDOGREL BISULFATE 75 MG PO TABS
75.0000 mg | ORAL_TABLET | Freq: Every day | ORAL | Status: DC
  Administered 2018-03-31 – 2018-04-02 (×3): 75 mg via ORAL
  Filled 2018-03-31 (×3): qty 1

## 2018-03-31 MED ORDER — PSYLLIUM 0.52 G PO CAPS: 0.5200 g | ORAL_CAPSULE | Freq: Every day | ORAL | Status: DC

## 2018-03-31 MED ORDER — MIRTAZAPINE 15 MG PO TABS
15.0000 mg | ORAL_TABLET | Freq: Every day | ORAL | Status: DC
  Administered 2018-03-31 – 2018-04-01 (×2): 15 mg via ORAL
  Filled 2018-03-31 (×2): qty 1

## 2018-03-31 MED ORDER — VITAMIN D 1000 UNITS PO TABS
1000.0000 [IU] | ORAL_TABLET | Freq: Every day | ORAL | Status: DC
  Administered 2018-04-01 – 2018-04-02 (×2): 1000 [IU] via ORAL
  Filled 2018-03-31 (×2): qty 1

## 2018-03-31 MED ORDER — METOPROLOL TARTRATE 12.5 MG HALF TABLET
12.5000 mg | ORAL_TABLET | Freq: Two times a day (BID) | ORAL | Status: DC
  Administered 2018-03-31: 25 mg via ORAL
  Administered 2018-04-01 – 2018-04-02 (×3): 12.5 mg via ORAL
  Filled 2018-03-31 (×4): qty 1

## 2018-03-31 MED ORDER — ATORVASTATIN CALCIUM 20 MG PO TABS
80.0000 mg | ORAL_TABLET | Freq: Every day | ORAL | Status: DC
  Administered 2018-03-31 – 2018-04-01 (×2): 80 mg via ORAL
  Filled 2018-03-31 (×2): qty 4

## 2018-03-31 MED ORDER — MORPHINE SULFATE (PF) 2 MG/ML IV SOLN
2.0000 mg | Freq: Once | INTRAVENOUS | Status: AC
  Administered 2018-03-31: 2 mg via INTRAVENOUS
  Filled 2018-03-31: qty 1

## 2018-03-31 MED ORDER — LORATADINE 10 MG PO TABS: 10.0000 mg | ORAL_TABLET | Freq: Every day | ORAL | Status: DC | PRN

## 2018-03-31 MED ORDER — DULOXETINE HCL 30 MG PO CPEP
30.0000 mg | ORAL_CAPSULE | Freq: Every day | ORAL | Status: DC
  Administered 2018-04-01 – 2018-04-02 (×2): 30 mg via ORAL
  Filled 2018-03-31 (×2): qty 1

## 2018-03-31 MED ORDER — FAMOTIDINE 20 MG PO TABS
20.0000 mg | ORAL_TABLET | Freq: Every day | ORAL | Status: DC
  Administered 2018-04-01 – 2018-04-02 (×2): 20 mg via ORAL
  Filled 2018-03-31 (×2): qty 1

## 2018-03-31 MED ORDER — FLUTICASONE PROPIONATE 50 MCG/ACT NA SUSP
1.0000 | Freq: Two times a day (BID) | NASAL | Status: DC
  Administered 2018-03-31 – 2018-04-01 (×2): 1 via NASAL
  Filled 2018-03-31: qty 16

## 2018-03-31 MED ORDER — TIMOLOL HEMIHYDRATE 0.25 % OP SOLN: 2.0000 [drp] | Freq: Two times a day (BID) | OPHTHALMIC | Status: DC

## 2018-03-31 MED ORDER — ACETAMINOPHEN 500 MG PO TABS
500.0000 mg | ORAL_TABLET | Freq: Three times a day (TID) | ORAL | Status: DC | PRN
  Administered 2018-04-01 – 2018-04-02 (×2): 500 mg via ORAL
  Filled 2018-03-31 (×2): qty 1

## 2018-03-31 MED ORDER — ALPRAZOLAM 0.25 MG PO TABS
0.1250 mg | ORAL_TABLET | Freq: Every day | ORAL | Status: DC
  Administered 2018-03-31: 0.125 mg via ORAL
  Filled 2018-03-31: qty 1

## 2018-03-31 MED ORDER — MORPHINE SULFATE (PF) 2 MG/ML IV SOLN
1.0000 mg | Freq: Four times a day (QID) | INTRAVENOUS | Status: DC | PRN
  Administered 2018-03-31 – 2018-04-01 (×2): 1 mg via INTRAVENOUS
  Filled 2018-03-31 (×3): qty 1

## 2018-03-31 MED ORDER — SENNOSIDES-DOCUSATE SODIUM 8.6-50 MG PO TABS
1.0000 | ORAL_TABLET | Freq: Every day | ORAL | Status: DC
  Administered 2018-03-31 – 2018-04-01 (×2): 1 via ORAL
  Filled 2018-03-31 (×2): qty 1

## 2018-03-31 MED ORDER — LOTEPREDNOL ETABONATE 0.5 % OP SUSP
1.0000 [drp] | Freq: Two times a day (BID) | OPHTHALMIC | Status: DC
  Administered 2018-04-01 (×2): 1 [drp] via OPHTHALMIC
  Filled 2018-03-31 (×2): qty 5

## 2018-03-31 MED ORDER — MORPHINE SULFATE (PF) 4 MG/ML IV SOLN
4.0000 mg | Freq: Once | INTRAVENOUS | Status: AC
  Administered 2018-03-31: 4 mg via INTRAVENOUS
  Filled 2018-03-31: qty 1

## 2018-03-31 MED ORDER — PSYLLIUM 95 % PO PACK
1.0000 | PACK | Freq: Every day | ORAL | Status: DC
  Administered 2018-04-01 – 2018-04-02 (×2): 1 via ORAL
  Filled 2018-03-31 (×2): qty 1

## 2018-03-31 MED ORDER — PRESERVISION AREDS 2 PO CAPS: 1.0000 | ORAL_CAPSULE | Freq: Two times a day (BID) | ORAL | Status: DC

## 2018-03-31 MED ORDER — TIMOLOL MALEATE 0.25 % OP SOLN
1.0000 [drp] | Freq: Two times a day (BID) | OPHTHALMIC | Status: DC
  Administered 2018-03-31 – 2018-04-01 (×3): 1 [drp] via OPHTHALMIC
  Filled 2018-03-31: qty 5

## 2018-03-31 MED ORDER — FUROSEMIDE 20 MG PO TABS
20.0000 mg | ORAL_TABLET | Freq: Every day | ORAL | Status: DC
  Administered 2018-03-31 – 2018-04-02 (×3): 20 mg via ORAL
  Filled 2018-03-31 (×3): qty 1

## 2018-03-31 MED ORDER — ENOXAPARIN SODIUM 40 MG/0.4ML ~~LOC~~ SOLN
40.0000 mg | SUBCUTANEOUS | Status: DC
  Administered 2018-03-31 – 2018-04-01 (×2): 40 mg via SUBCUTANEOUS
  Filled 2018-03-31 (×2): qty 0.4

## 2018-03-31 NOTE — Progress Notes (Signed)
[facility] - RN note at 1540  Received call from ER RN Maralyn Sago that patient daughter needed to speak with a Hospice Nurse.  Spoke with patient's daughter Yvette Carroll on telephone who was expressing concerns about patient pain control and safety in returning back to [facility]. Listened and supported Yvette Carroll then had [facility] SW [facility] contact daughter about possible options. [facility] SW  [place] encouraged Daughter to contact facility about a room change to a lower floor closer to nursing station, having the  patient evaluated for memory care unit at facility, family hiring a private sitter or having [facility] evaluate patient for increased hospice aide services.   Reached out to follow up with the patient's daughter via telephone, Daughter confirmed that she would reach out to facility to discuss safety concerns and was still concerned about patient pain control when ER MD entered room to visit patient. Before ending telephone conversation, Encouraged Daughter Yvette Carroll to discuss pain control options with MD then notified daughter that [facility] will continue to follow patient while in hospital and will be available to discuss any Hospice related concerns if needed.    Roda Shutters, RN [facility] Liaison [phone]

## 2018-03-31 NOTE — ED Triage Notes (Signed)
Per ems- from [facility]; staff found her on the floor of her room, believed to have been down about 1 hour. At baseline mental status, c/o pain to right shoulder, pain with ROM, thoracic back pain, coccyx. Placed in c-collar. Noticed shortening to right leg? Given 50 mcg fentanyl for right hip pain. BP 200/98, HR 94. CBG 130. 20 L. AC.

## 2018-03-31 NOTE — ED Notes (Signed)
Pt requesting more pain meds, MD made aware. 

## 2018-03-31 NOTE — H&P (Signed)
History and Physical    DOA: 03/31/2018  PCP: Pearson Grippe, MD  Patient coming from: Assisted living facility  Chief Complaint: Fall  HPI: Yvette Carroll is a 84 y.o. female with history h/o CVA with no residual effects except for dysphagia, hypertension, depression, GERD, spinal stenosis, osteoporosis/osteoarthritis status post bilateral hip replacements and vertigo presents today from assisted living facility after suffering a fall.  Patient is awake alert oriented, able to provide most of the history.  She is wheelchair-bound at baseline but able to transfer independently and able to perform most of her ADLs independently.  She apparently was trying to transfer from her bed to wheelchair this morning when she lost her grip on the bed rail and slipped down to the floor landing on her buttocks.  She is complaining of pain 10/10 in the sacral area.  She denies any history of dizziness or chest pain or palpitations before or after the fall.  She did not hit her head.  She was apparently on the floor for an hour trying to get herself up and to reach the phone.  She was finally found by the nursing aide and helped back to the bed. Daughter (who is a Charity fundraiser at [employer]) reports that they have been contemplating higher level of care or closer nursing supervision by moving her room.  She is not sure if she would want her mother to go back to the same assisted living facility.  She has also been in discussions with hospice nurses.  She is concerned that her mother would not be able to function at her baseline with the current level of pain.  Patient is requested to be admitted overnight for observation and pain control as well as discussions regarding care goals/disposition.  Patient does have a history of chronic dysphagia and is on mechanical soft diet at baseline.  During the interview process she is noted to have involuntary head movements and daughter reports that she is had "tics" for a year now.   Patient apparently was evaluated by a neurologist in February when she had a stroke but not clear if she had involuntary movements at that time.    Review of Systems: As per HPI otherwise 10 point review of systems negative.    Past Medical History:  Diagnosis Date  . Depression   . GERD (gastroesophageal reflux disease)   . Glaucoma   . Hypertension   . Lumbar spinal stenosis   . Osteoporosis   . Right knee DJD   . Vertigo     Past Surgical History:  Procedure Laterality Date  . ABDOMINAL HYSTERECTOMY    . EYE SURGERY    . FRACTURE SURGERY    . HIP FRACTURE SURGERY Left   . INTRAMEDULLARY (IM) NAIL INTERTROCHANTERIC Right 04/24/2013   Procedure: INTRAMEDULLARY (IM) NAIL INTERTROCHANTRIC;  Surgeon: Nadara Mustard, MD;  Location: [facility] OR;  Service: Orthopedics;  Laterality: Right;  . NECK SURGERY      Social history:  reports that she has never smoked. She has never used smokeless tobacco. She reports that she does not drink alcohol or use drugs.   Allergies  Allergen Reactions  . Betadine [Povidone Iodine] Itching and Rash    Reaction is SEVERE  . Lisinopril Anaphylaxis and Swelling    Throat swells  . Shellfish Allergy Anaphylaxis  . Macrolides And Ketolides Other (See Comments)    Per MAR  . Penicillins Swelling    Site of swelling?? Has patient had a PCN  reaction causing immediate rash, facial/tongue/throat swelling, SOB or lightheadedness with hypotension: Yes Has patient had a PCN reaction causing severe rash involving mucus membranes or skin necrosis: Unk Has patient had a PCN reaction that required hospitalization: Unk Has patient had a PCN reaction occurring within the last 10 years: Unk If all of the above answers are "NO", then may proceed with Cephalosporin use.   Marland Kitchen Risperdal [Risperidone] Other (See Comments)    Per MAR  . Iodine Rash    Family history reviewed, no history of strokes or cancers in the family   Prior to Admission medications     Medication Sig Start Date End Date Taking? Authorizing Provider  acetaminophen (TYLENOL) 500 MG tablet Take 500 mg by mouth 3 (three) times daily as needed for moderate pain.    Yes [provider]  ALPRAZolam (XANAX) 0.25 MG tablet Take 0.5 tablets (0.125 mg total) by mouth at bedtime. 09/26/17  Yes Standley Brooking, MD  antiseptic oral rinse (BIOTENE) LIQD 15 mLs by Mouth Rinse route 3 (three) times daily. May self administer   Yes [provider]  atorvastatin (LIPITOR) 80 MG tablet Take 1 tablet (80 mg total) by mouth daily at 6 PM. 09/26/17  Yes Standley Brooking, MD  bimatoprost (LUMIGAN) 0.01 % SOLN Place 1 drop into both eyes every evening.    Yes [provider]  calcium citrate (CALCITRATE - DOSED IN MG ELEMENTAL CALCIUM) 950 MG tablet Take 200 mg of elemental calcium by mouth daily.    Yes [provider]  cholecalciferol (VITAMIN D) 1000 units tablet Take 1,000 Units by mouth daily.    Yes [provider]  clopidogrel (PLAVIX) 75 MG tablet Take 1 tablet (75 mg total) by mouth daily. 09/27/17  Yes Standley Brooking, MD  DULoxetine (CYMBALTA) 30 MG capsule Take 30 mg by mouth daily.   Yes [provider]  ENSURE PLUS (ENSURE PLUS) LIQD Take 237 mLs by mouth daily.    Yes [provider]  fluticasone (FLONASE) 50 MCG/ACT nasal spray Place 1 spray into both nostrils 2 (two) times daily.    Yes [provider]  furosemide (LASIX) 20 MG tablet Take 20 mg by mouth daily.   Yes [provider]  lidocaine (LIDODERM) 5 % Place 1 patch onto the skin daily as needed (for knee, hip and back pain). Remove & Discard patch within 12 hours or as directed by MD   Yes [provider]  loratadine (CLARITIN) 10 MG tablet Take 10 mg by mouth daily as needed (for runny nose or sinus symptoms).    Yes [provider]  loteprednol (LOTEMAX) 0.5 % ophthalmic suspension Place 1 drop into both eyes 2 (two) times  daily.    Yes [provider]  Menthol, Topical Analgesic, (BIOFREEZE) 4 % GEL Apply 1 application topically 2 (two) times daily. Both knees   Yes [provider]  mirtazapine (REMERON) 15 MG tablet Take 15 mg by mouth at bedtime.   Yes [provider]  morphine 20 MG/5ML solution Take 5 mg by mouth every 4 (four) hours as needed for pain.    Yes [provider]  oxycodone (OXY-IR) 5 MG capsule Take 5 mg by mouth 2 (two) times daily as needed for pain.   Yes [provider]  Oxycodone HCl 10 MG TABS Take 1 tablet (10 mg total) by mouth 2 (two) times daily. 09/26/17  Yes Standley Brooking, MD  Polyethyl Glycol-Propyl Glycol [facility])  0.4-0.3 % SOLN Place 1 drop into both eyes 4 (four) times daily as needed (for dry eyes).    Yes [provider]  polyethylene glycol (MIRALAX / GLYCOLAX) packet Take 17 g by mouth daily.   Yes [provider]  potassium chloride (K-DUR,KLOR-CON) 10 MEQ tablet Take 10 mEq by mouth daily. With Lasix   Yes [provider]  psyllium (METAMUCIL) 58.6 % powder Take 1 packet by mouth daily. 3.4 grams   Yes [provider]  ranitidine (ZANTAC) 150 MG tablet Take 150 mg by mouth 2 (two) times daily.   Yes [provider]  senna-docusate (SENEXON-S) 8.6-50 MG tablet Take 1 tablet by mouth at bedtime.   Yes [provider]  sodium chloride (OCEAN) 0.65 % SOLN nasal spray Place 1 spray into both nostrils as needed for congestion.   Yes [provider]  timolol (BETIMOL) 0.25 % ophthalmic solution Place 2 drops into both eyes 2 (two) times daily.    Yes [provider]  hydrocortisone cream 0.5 % Apply 1 application topically as needed for itching.    [provider]  psyllium (FIBER LAXATIVE) 0.52 g capsule Take 0.52 g by mouth at bedtime.    [provider]    Physical Exam: Vitals:   03/31/18 1245 03/31/18 1300 03/31/18 1441 03/31/18 1728  BP:  (!) 187/90 (!) 204/82 (!) 180/78 (!) 198/100  Pulse: (!) 103 (!) 104 73   Resp: 17 16  18   Temp:      TempSrc:      SpO2: 98% 98% 97% 97%    Constitutional: NAD, calm, comfortable Vitals:   03/31/18 1245 03/31/18 1300 03/31/18 1441 03/31/18 1728  BP: (!) 187/90 (!) 204/82 (!) 180/78 (!) 198/100  Pulse: (!) 103 (!) 104 73   Resp: 17 16  18   Temp:      TempSrc:      SpO2: 98% 98% 97% 97%   Eyes: PERRL, lids and conjunctivae normal ENMT: Mucous membranes are moist. Posterior pharynx clear of any exudate or lesions.Normal dentition.  Neck: normal, supple, no masses, no thyromegaly Respiratory: clear to auscultation bilaterally, no wheezing, no crackles. Normal respiratory effort. No accessory muscle use.  Cardiovascular: Regular rate and rhythm.  Loud systolic murmur best heard at aortic area/apex. No extremity edema. 2+ pedal pulses. No carotid bruits.  Abdomen: no tenderness, no masses palpated. No hepatosplenomegaly. Bowel sounds positive.  Musculoskeletal: no clubbing / cyanosis. No joint deformity upper and lower extremities. Good ROM, no contractures. Normal muscle tone.  Neurologic: CN 2-12 grossly intact. Sensation intact, DTR normal. Strength 4/5 in all 4.  Psychiatric: Normal judgment and insight. Alert and oriented x 3. Normal mood.  SKIN/catheters: Old bruise on right lower extremity, new bruises on bilateral upper extremities.  Labs on Admission: I have personally reviewed following labs and imaging studies  CBC: Recent Labs  Lab 03/31/18 1020  WBC 6.1  NEUTROABS 4.2  HGB 12.0  HCT 38.9  MCV 94.4  PLT 262   Basic Metabolic Panel: Recent Labs  Lab 03/31/18 1020  NA 139  K 4.2  CL 99  CO2 28  GLUCOSE 106*  BUN 14  CREATININE 0.88  CALCIUM 9.3   GFR: CrCl cannot be calculated (Unknown ideal weight.). Liver Function Tests: No results for input(s): AST, ALT, ALKPHOS, BILITOT, PROT, ALBUMIN in the last 168 hours. No results for input(s): LIPASE, AMYLASE  in the last 168 hours. No results for input(s): AMMONIA in the last 168 hours.  Coagulation Profile: No results for input(s): INR, PROTIME in the last 168 hours. Cardiac Enzymes: No results for input(s): CKTOTAL, CKMB, CKMBINDEX, TROPONINI in the last 168 hours. BNP (last 3 results) No results for input(s): PROBNP in the last 8760 hours. HbA1C: No results for input(s): HGBA1C in the last 72 hours. CBG: No results for input(s): GLUCAP in the last 168 hours. Lipid Profile: No results for input(s): CHOL, HDL, LDLCALC, TRIG, CHOLHDL, LDLDIRECT in the last 72 hours. Thyroid Function Tests: No results for input(s): TSH, T4TOTAL, FREET4, T3FREE, THYROIDAB in the last 72 hours. Anemia Panel: No results for input(s): VITAMINB12, FOLATE, FERRITIN, TIBC, IRON, RETICCTPCT in the last 72 hours. Urine analysis:    Component Value Date/Time   COLORURINE STRAW (A) 03/31/2018 1426   APPEARANCEUR CLEAR 03/31/2018 1426   LABSPEC 1.008 03/31/2018 1426   PHURINE 8.0 03/31/2018 1426   GLUCOSEU NEGATIVE 03/31/2018 1426   HGBUR NEGATIVE 03/31/2018 1426   BILIRUBINUR NEGATIVE 03/31/2018 1426   KETONESUR NEGATIVE 03/31/2018 1426   PROTEINUR NEGATIVE 03/31/2018 1426   UROBILINOGEN 1.0 03/14/2014 1543   NITRITE NEGATIVE 03/31/2018 1426   LEUKOCYTESUR NEGATIVE 03/31/2018 1426    Radiological Exams on Admission: Dg Chest 1 View  Result Date: 03/31/2018 CLINICAL DATA:  RIGHT hip pain post fall EXAM: CHEST  1 VIEW COMPARISON:  08/15/2016 FINDINGS: Upper normal heart size. Mediastinal contours and pulmonary vascularity normal. Atherosclerotic calcification aorta. Chronic peribronchial thickening. No infiltrate, pleural effusion or pneumothorax. Bones demineralized with note of a chronic RIGHT rotator cuff tear, LEFT glenohumeral degenerative changes, and prior cervical spine fusion. IMPRESSION: Chronic bronchitic changes without infiltrate. Electronically Signed   By: Ulyses Southward M.D.   On: 03/31/2018 11:49    Ct Head Wo Contrast  Result Date: 03/31/2018 CLINICAL DATA:  84 year old female found down this morning.  Pain. EXAM: CT HEAD WITHOUT CONTRAST TECHNIQUE: Contiguous axial images were obtained from the base of the skull through the vertex without intravenous contrast. COMPARISON:  Head CT and brain MRI 09/24/2017. FINDINGS: Brain: Stable cerebral volume. Stable gray-white matter differentiation throughout the brain. No midline shift, ventriculomegaly, mass effect, evidence of mass lesion, intracranial hemorrhage or evidence of cortically based acute infarction. Vascular: Calcified atherosclerosis at the skull base. No suspicious intracranial vascular hyperdensity. Skull: Stable and intact. Sinuses/Orbits: Visualized paranasal sinuses and mastoids are stable and well pneumatized aside from mildly increased chronic fluid and/or bubbly opacity in the sphenoid sinuses. Other: No scalp hematoma identified.  Stable orbits soft tissues. IMPRESSION: Stable. No acute intracranial abnormality or acute traumatic injury identified. Electronically Signed   By: Odessa Fleming M.D.   On: 03/31/2018 12:56   Ct Cervical Spine Wo Contrast  Result Date: 03/31/2018 CLINICAL DATA:  84 year old female found down this morning.  Pain. EXAM: CT CERVICAL SPINE WITHOUT CONTRAST TECHNIQUE: Multidetector CT imaging of the cervical spine was performed without intravenous contrast. Multiplanar CT image reconstructions were also generated. COMPARISON:  Head CT today reported separately. Brain MRI 09/24/2017. FINDINGS: Alignment: Cervicothoracic junction alignment is within normal limits. Bilateral posterior element alignment is within normal limits. Mild degenerative appearing anterolisthesis of C4 on C5 and C3 on C4 appears stable since March. Skull base and vertebrae: Osteopenia. Visualized skull base is intact. No atlanto-occipital dissociation. No cervical spine fracture identified. Soft tissues and spinal canal: No prevertebral fluid or  swelling. No visible canal hematoma. Negative noncontrast neck soft tissues aside from calcified carotid atherosclerosis and a partially retropharyngeal course of both carotids. Disc levels: Prior C6-C7 ACDF with hardware in place  and solid arthrodesis. Probable developing interbody ankylosis at C5-C6. Similar severe disc space loss at C7-T1 where developing ankylosis is also possible. Upper cervical facet arthropathy in the setting of mild spondylolisthesis. No cervical spinal stenosis suspected. Upper chest: Visible upper thoracic levels appear intact. Advanced T1-T2 disc and endplate degeneration. Negative lung apices aside from scarring. Negative noncontrast thoracic inlet aside from postoperative changes. Other: Head CT today reported separately. IMPRESSION: 1.  No acute traumatic injury identified in the cervical spine. 2. Prior C6-C7 ACDF with solid arthrodesis. Possible developing ankylosis at both C5-C6 and C7-T1. Electronically Signed   By: Odessa Fleming M.D.   On: 03/31/2018 13:01   Dg Hip Unilat With Pelvis 2-3 Views Right  Result Date: 03/31/2018 CLINICAL DATA:  RIGHT hip pain post fall EXAM: DG HIP (WITH OR WITHOUT PELVIS) 2-3V RIGHT COMPARISON:  08/12/2016 FINDINGS: Diffuse osseous demineralization. IM nail with compression screw at proximal RIGHT femur unchanged. Plate with compression screw proximal LEFT femur again identified with mild lucency surrounding the screw. Deformities of the RIGHT superior inferior pubic rami post old fractures. No definite acute fracture, dislocation or bone destruction. Scattered atherosclerotic calcifications. IMPRESSION: Prior BILATERAL proximal femoral ORIF with lucency seen surrounding the screw at the LEFT femoral neck, cannot exclude loosening or infection with this appearance. Osseous demineralization without acute bony abnormalities. Old RIGHT superior and inferior pubic rami fractures. Electronically Signed   By: Ulyses Southward M.D.   On: 03/31/2018 11:48    EKG:  Independently reviewed.  Sinus rhythm with PVCs/trigeminy     Assessment and Plan:   1.  Mechanical fall: No acute injuries on CT head/CT spine.  Pelvic x-ray showed old right superior and inferior pubic rami fractures but no new injuries.  Admit with oral and IV pain medications as needed.  Patient also on low-dose Xanax at baseline for many years.  High risk for delirium.   2.  CAD/valvular heart disease/diastolic cardiomyopathy: Resume home medications.  Last echocardiogram in March showed EF of 65% with moderate LVH and moderate to severe aortic stenosis.  Patient noted to be on Lasix at baseline.  3.  GERD: Resume PPI  4.  History of CVA: On Plavix and statins.  Patient has chronic dysphagia and also has mild dementia/periods of confusion at baseline per daughter.  5.  Glaucoma: Resumed home medications  6.  Disposition: To be determined based on family discussion with PT/case management and palliative care team.  DVT prophylaxis: Lovenox  Code Status: DNR confirmed by patient and daughter  Family Communication: Discussed with patient. Health care proxy would be daughter Consults called: Palliative care Admission status:  Patient admitted as observation as anticipated LOS less than 2 midnights    Alessandra Bevels MD [facility] Pager [phone]  If 7PM-7AM, please contact night-coverage [email or web address] Password [facility]  03/31/2018, 7:23 PM

## 2018-03-31 NOTE — ED Provider Notes (Signed)
[facility] EMERGENCY DEPARTMENT Provider Note   CSN: [id number] Arrival date & time: 03/31/18  0940     History   Chief Complaint Chief Complaint  Patient presents with  . Fall    HPI Yvette Carroll is a 84 y.o. female.  84 year old female with prior medical history as detailed below presents for evaluation following fall.  Patient reports that she was trying to get from her bed to her wheelchair when the wheelchair moved.  She landed on the floor.  She complains of right hip pain.  She apparently may have been on the floor for approximately an hour prior to staff discovery.  EMS has given the patient does not of fentanyl in route to the ED and she feels improved.  She denies headache, neck pain, chest pain, shortness of breath, nausea, vomiting, or other acute complaint.  She does complain of vague right-sided hip pain.  The history is provided by the patient, medical records and the EMS personnel.  Fall  This is a new problem. The current episode started 3 to 5 hours ago. The problem occurs rarely. The problem has not changed since onset.Pertinent negatives include no chest pain, no abdominal pain, no headaches and no shortness of breath. Nothing aggravates the symptoms. Nothing relieves the symptoms. She has tried nothing for the symptoms.    Past Medical History:  Diagnosis Date  . Depression   . GERD (gastroesophageal reflux disease)   . Glaucoma   . Hypertension   . Lumbar spinal stenosis   . Osteoporosis   . Right knee DJD   . Vertigo     Patient Active Problem List   Diagnosis Date Noted  . Acute CVA (cerebrovascular accident) (HCC) 09/25/2017  . Benign essential HTN 09/25/2017  . Depression with anxiety 09/24/2017  . Acute posthemorrhagic anemia 05/27/2013  . Chronic low back pain 04/30/2013  . Glaucoma 04/30/2013  . Allergic rhinitis 04/30/2013  . GERD (gastroesophageal reflux disease) 04/30/2013  . CAD (coronary artery disease) 04/30/2013    . Constipation 04/30/2013  . Hip fracture (HCC) 04/24/2013  . Anemia 04/24/2013    Past Surgical History:  Procedure Laterality Date  . ABDOMINAL HYSTERECTOMY    . EYE SURGERY    . FRACTURE SURGERY    . HIP FRACTURE SURGERY Left   . INTRAMEDULLARY (IM) NAIL INTERTROCHANTERIC Right 04/24/2013   Procedure: INTRAMEDULLARY (IM) NAIL INTERTROCHANTRIC;  Surgeon: Nadara Mustard, MD;  Location: [facility] OR;  Service: Orthopedics;  Laterality: Right;  . NECK SURGERY       OB History   None      Home Medications    Prior to Admission medications   Medication Sig Start Date End Date Taking? Authorizing Provider  acetaminophen (TYLENOL) 500 MG tablet Take 500 mg by mouth 3 (three) times daily as needed for moderate pain.     [provider]  ALPRAZolam Prudy Feeler) 0.25 MG tablet Take 0.5 tablets (0.125 mg total) by mouth at bedtime. 09/26/17   Standley Brooking, MD  antiseptic oral rinse (BIOTENE) LIQD 15 mLs by Mouth Rinse route 3 (three) times daily. May self administer    [provider]  atorvastatin (LIPITOR) 80 MG tablet Take 1 tablet (80 mg total) by mouth daily at 6 PM. 09/26/17   Standley Brooking, MD  bimatoprost (LUMIGAN) 0.01 % SOLN Place 1 drop into both eyes every evening.     [provider]  calcium citrate (CALCITRATE - DOSED IN MG ELEMENTAL CALCIUM) 950  MG tablet Take 200 mg of elemental calcium by mouth daily.     [provider]  cholecalciferol (VITAMIN D) 1000 units tablet Take 1,000 Units by mouth daily.     [provider]  clopidogrel (PLAVIX) 75 MG tablet Take 1 tablet (75 mg total) by mouth daily. 09/27/17   Standley Brooking, MD  diphenhydrAMINE-zinc acetate Ronnald Nian) cream Apply 1 application topically daily as needed for itching. Apply topically to chin every day     [provider]  Docosanol (ABREVA) 10 % CREA Apply 1 application topically 4 (four) times daily as needed (for fever blisters).     [provider]  DULoxetine (CYMBALTA) 60 MG capsule Take 60 mg by mouth daily.    [provider]  ENSURE PLUS (ENSURE PLUS) LIQD Take 237 mLs by mouth 2 (two) times daily.    [provider]  fluticasone (FLONASE) 50 MCG/ACT nasal spray Place 1 spray into both nostrils 2 (two) times daily.     [provider]  furosemide (LASIX) 20 MG tablet Take 20 mg by mouth daily.    [provider]  hydrocortisone cream 0.5 % Apply 1 application topically as needed for itching.    [provider]  lidocaine (LIDODERM) 5 % Place 1 patch onto the skin daily as needed (for knee, hip and back pain). Remove & Discard patch within 12 hours or as directed by MD    [provider]  loratadine (CLARITIN) 10 MG tablet Take 10 mg by mouth daily as needed (for runny nose or sinus symptoms).     [provider]  loteprednol (LOTEMAX) 0.5 % ophthalmic suspension Place 1 drop into both eyes 2 (two) times daily.     [provider]  Menthol-Methyl Salicylate (MUSCLE RUB) 10-15 % CREA Apply 1 application topically daily as needed for muscle pain.    [provider]  metoprolol tartrate (LOPRESSOR) 25 MG tablet Take 12.5 mg by mouth 2 (two) times daily.    [provider]  mirtazapine (REMERON) 15 MG tablet Take 15 mg by mouth at bedtime.    [provider]  Multiple Vitamins-Minerals (PRESERVISION AREDS 2) CAPS Take 1 capsule by mouth 2 (two) times daily.     [provider]  Oxycodone HCl 10 MG TABS Take 1 tablet (10 mg total) by mouth 2 (two) times daily. 09/26/17   Standley Brooking, MD  Polyethyl Glycol-Propyl Glycol (SYSTANE) 0.4-0.3 % SOLN Place 1 drop into both eyes 4 (four) times daily as needed (for dry eyes).     [provider]  polyethylene glycol (MIRALAX / GLYCOLAX) packet Take 17 g by mouth daily.    [provider]  potassium chloride (K-DUR,KLOR-CON) 10 MEQ tablet Take 10 mEq by mouth daily. With  Lasix    [provider]  psyllium (FIBER LAXATIVE) 0.52 g capsule Take 0.52 g by mouth at bedtime.    [provider]  ranitidine (ZANTAC) 150 MG tablet Take 150 mg by mouth 2 (two) times daily.    [provider]  senna-docusate (SENEXON-S) 8.6-50 MG tablet Take 1 tablet by mouth at bedtime.    [provider]  sodium chloride (OCEAN) 0.65 % SOLN nasal spray Place 1 spray into both nostrils as needed for congestion.    [provider]  timolol (BETIMOL) 0.25 % ophthalmic solution Place 2 drops into both eyes 2 (two) times daily.     [provider]    [facility]  History No family history on file.  Social History Social History   Tobacco Use  . Smoking status: Never Smoker  . Smokeless tobacco: Never Used  Substance Use Topics  . Alcohol use: No  . Drug use: No     Allergies   Lisinopril; Iodine; Macrolides and ketolides; Penicillins; Risperdal [risperidone]; Shellfish allergy; and Betadine [povidone iodine]   Review of Systems Review of Systems  Respiratory: Negative for shortness of breath.   Cardiovascular: Negative for chest pain.  Gastrointestinal: Negative for abdominal pain.  Neurological: Negative for headaches.  All other systems reviewed and are negative.    Physical Exam Updated Vital Signs BP (!) 210/74 (BP Location: Left Arm)   Pulse 94   Temp 98 F (36.7 C) (Oral)   Resp 19   SpO2 99%   Physical Exam  Constitutional: She is oriented to person, place, and time. She appears well-developed and well-nourished. No distress.  HENT:  Head: Normocephalic and atraumatic.  Mouth/Throat: Oropharynx is clear and moist.  Eyes: Pupils are equal, round, and reactive to light. Conjunctivae and EOM are normal.  Neck: Normal range of motion. Neck supple.  Cardiovascular: Normal rate, regular rhythm and normal heart sounds.  Pulmonary/Chest: Effort normal and breath sounds normal. No respiratory distress.  Abdominal:  Soft. She exhibits no distension. There is no tenderness.  Musculoskeletal: Normal range of motion. She exhibits tenderness. She exhibits no edema or deformity.  Right hip tenderness with palpitations   Distal RLE is NVI   Neurological: She is alert and oriented to person, place, and time.  Skin: Skin is warm and dry.  Psychiatric: She has a normal mood and affect.  Nursing note and vitals reviewed.    ED Treatments / Results  Labs (all labs ordered are listed, but only abnormal results are displayed) Labs Reviewed  BASIC METABOLIC PANEL - Abnormal; Notable for the following components:      Result Value   Glucose, Bld 106 (*)    GFR calc non Af Amer 54 (*)    All other components within normal limits  URINALYSIS, ROUTINE W REFLEX MICROSCOPIC - Abnormal; Notable for the following components:   Color, Urine STRAW (*)    All other components within normal limits  CBC WITH DIFFERENTIAL/PLATELET  I-STAT TROPONIN, ED    EKG EKG Interpretation  Date/Time:  Monday March 31 2018 09:56:26 EDT Ventricular Rate:  80 PR Interval:    QRS Duration: 94 QT Interval:  380 QTC Calculation: 439 R Axis:   55 Text Interpretation:  Sinus tachycardia Ventricular trigeminy Prolonged PR interval Repol abnrm suggests ischemia, lateral leads Baseline wander in lead(s) V6 Confirmed by Kristine Royal [phone]) on 03/31/2018 10:06:19 AM Also confirmed by Kristine Royal [phone]), editor Ryegate, Tamera Punt ([id number])  on 03/31/2018 3:00:43 PM   Radiology Dg Chest 1 View  Result Date: 03/31/2018 CLINICAL DATA:  RIGHT hip pain post fall EXAM: CHEST  1 VIEW COMPARISON:  08/15/2016 FINDINGS: Upper normal heart size. Mediastinal contours and pulmonary vascularity normal. Atherosclerotic calcification aorta. Chronic peribronchial thickening. No infiltrate, pleural effusion or pneumothorax. Bones demineralized with note of a chronic RIGHT rotator cuff tear, LEFT glenohumeral degenerative changes, and prior cervical  spine fusion. IMPRESSION: Chronic bronchitic changes without infiltrate. Electronically Signed   By: Ulyses Southward M.D.   On: 03/31/2018 11:49   Ct Head Wo Contrast  Result Date: 03/31/2018 CLINICAL DATA:  84 year old female found down this morning.  Pain. EXAM: CT HEAD WITHOUT CONTRAST TECHNIQUE: Contiguous axial images were obtained from  the base of the skull through the vertex without intravenous contrast. COMPARISON:  Head CT and brain MRI 09/24/2017. FINDINGS: Brain: Stable cerebral volume. Stable gray-white matter differentiation throughout the brain. No midline shift, ventriculomegaly, mass effect, evidence of mass lesion, intracranial hemorrhage or evidence of cortically based acute infarction. Vascular: Calcified atherosclerosis at the skull base. No suspicious intracranial vascular hyperdensity. Skull: Stable and intact. Sinuses/Orbits: Visualized paranasal sinuses and mastoids are stable and well pneumatized aside from mildly increased chronic fluid and/or bubbly opacity in the sphenoid sinuses. Other: No scalp hematoma identified.  Stable orbits soft tissues. IMPRESSION: Stable. No acute intracranial abnormality or acute traumatic injury identified. Electronically Signed   By: Odessa Fleming M.D.   On: 03/31/2018 12:56   Ct Cervical Spine Wo Contrast  Result Date: 03/31/2018 CLINICAL DATA:  84 year old female found down this morning.  Pain. EXAM: CT CERVICAL SPINE WITHOUT CONTRAST TECHNIQUE: Multidetector CT imaging of the cervical spine was performed without intravenous contrast. Multiplanar CT image reconstructions were also generated. COMPARISON:  Head CT today reported separately. Brain MRI 09/24/2017. FINDINGS: Alignment: Cervicothoracic junction alignment is within normal limits. Bilateral posterior element alignment is within normal limits. Mild degenerative appearing anterolisthesis of C4 on C5 and C3 on C4 appears stable since March. Skull base and vertebrae: Osteopenia. Visualized skull base is  intact. No atlanto-occipital dissociation. No cervical spine fracture identified. Soft tissues and spinal canal: No prevertebral fluid or swelling. No visible canal hematoma. Negative noncontrast neck soft tissues aside from calcified carotid atherosclerosis and a partially retropharyngeal course of both carotids. Disc levels: Prior C6-C7 ACDF with hardware in place and solid arthrodesis. Probable developing interbody ankylosis at C5-C6. Similar severe disc space loss at C7-T1 where developing ankylosis is also possible. Upper cervical facet arthropathy in the setting of mild spondylolisthesis. No cervical spinal stenosis suspected. Upper chest: Visible upper thoracic levels appear intact. Advanced T1-T2 disc and endplate degeneration. Negative lung apices aside from scarring. Negative noncontrast thoracic inlet aside from postoperative changes. Other: Head CT today reported separately. IMPRESSION: 1.  No acute traumatic injury identified in the cervical spine. 2. Prior C6-C7 ACDF with solid arthrodesis. Possible developing ankylosis at both C5-C6 and C7-T1. Electronically Signed   By: Odessa Fleming M.D.   On: 03/31/2018 13:01   Dg Hip Unilat With Pelvis 2-3 Views Right  Result Date: 03/31/2018 CLINICAL DATA:  RIGHT hip pain post fall EXAM: DG HIP (WITH OR WITHOUT PELVIS) 2-3V RIGHT COMPARISON:  08/12/2016 FINDINGS: Diffuse osseous demineralization. IM nail with compression screw at proximal RIGHT femur unchanged. Plate with compression screw proximal LEFT femur again identified with mild lucency surrounding the screw. Deformities of the RIGHT superior inferior pubic rami post old fractures. No definite acute fracture, dislocation or bone destruction. Scattered atherosclerotic calcifications. IMPRESSION: Prior BILATERAL proximal femoral ORIF with lucency seen surrounding the screw at the LEFT femoral neck, cannot exclude loosening or infection with this appearance. Osseous demineralization without acute bony  abnormalities. Old RIGHT superior and inferior pubic rami fractures. Electronically Signed   By: Ulyses Southward M.D.   On: 03/31/2018 11:48    Procedures Procedures (including critical care time)  Medications Ordered in ED Medications - No data to display   Initial Impression / Assessment and Plan / ED Course  I have reviewed the triage vital signs and the nursing notes.  Pertinent labs & imaging results that were available during my care of the patient were reviewed by me and considered in my medical decision making (see chart for details).  MDM  Screen complete  Patient is presenting for evaluation of pain to the right hip following a fall.  It appears that patient may have been unable to safely transfer from bed to wheelchair.  Screening exam today does not reveals significant traumatic injury or acute fracture.  Patient continued significant pain following the fall.  After discussion with the patient's daughter at bedside, I feel that the patient may require a higher level of care (vs current assisted living situation).    Case discussed with the hospitalist service who will evaluate the patient for possible observation admission.   Final Clinical Impressions(s) / ED Diagnoses   Final diagnoses:  Fall, initial encounter    ED Discharge Orders    None       Wynetta Fines, MD 03/31/18 [phone]

## 2018-03-31 NOTE — ED Notes (Signed)
Spoke with hospice, RN will be coming by to speak with family. They have concerns about [place] coming back to [place], and concerns for fall risk. Cordelia Pen, RN Hospice will be coming by.

## 2018-03-31 NOTE — ED Notes (Signed)
Given apple sauce, ginger ale. Sitting upright in bed. Tolerating well thus far. Daughter at bedside.

## 2018-03-31 NOTE — ED Notes (Signed)
Patient transported to CT 

## 2018-03-31 NOTE — Progress Notes (Signed)
[place] ED B19 - [facility] ([facility]) - RN visit at 10:15am  [facility] received a call from [facility] at 9:20 am this morning that patient had experienced a fall at the facility and was being transported to [facility] ER for evaluation of a possible Right hip fracture. Pt arrived at [facility] ER at 9:40 am with complaints of right shoulder and back pain. Patient has a DNR.   Checked in with ER RN Maralyn Sago who stated patient is experiencing right shoulder/back pain with possible shortening of RLE, and has a CT and XR ordered. Visited patient in room, who was alert/pleasantly confused with a C-collar lying supine in bed with no complaints of pain at this time. Patient stated that they gave her pain medicine that made "her feel much better". Per ER Epic notes, pt was administered of fentanyl for pain upon arrival.   Spoke with patient Daughter [name]) via telephone who was in the ER waiting room awaiting to see patient - listened and supported and encouraged to call with any Hospice concerns. Notified patient and daughter that [facility] will continue to follow patient while hospitalized.  Should ambulance transportation be needed, please call [facility] [phone]) as [facility] contracts with [facility] for out patients. [facility] team updated on patient status at this time.   Please call with any Hospice related concerns,  Roda Shutters, RN [facility] Liaison [phone]  [facility] Liaisons found on AMION

## 2018-03-31 NOTE — ED Notes (Signed)
Pt's daughter st's pt has not had any of her blood pressure meds today

## 2018-04-01 DIAGNOSIS — M549 Dorsalgia, unspecified: Secondary | ICD-10-CM

## 2018-04-01 DIAGNOSIS — Z515 Encounter for palliative care: Secondary | ICD-10-CM | POA: Diagnosis not present

## 2018-04-01 DIAGNOSIS — Z7189 Other specified counseling: Secondary | ICD-10-CM | POA: Diagnosis not present

## 2018-04-01 DIAGNOSIS — W19XXXA Unspecified fall, initial encounter: Secondary | ICD-10-CM | POA: Diagnosis not present

## 2018-04-01 DIAGNOSIS — G8929 Other chronic pain: Secondary | ICD-10-CM | POA: Diagnosis not present

## 2018-04-01 MED ORDER — ALPRAZOLAM 0.25 MG PO TABS
0.1250 mg | ORAL_TABLET | Freq: Two times a day (BID) | ORAL | Status: DC | PRN
  Administered 2018-04-01 – 2018-04-02 (×2): 0.125 mg via ORAL
  Filled 2018-04-01 (×2): qty 1

## 2018-04-01 MED ORDER — MUPIROCIN 2 % EX OINT
1.0000 "application " | TOPICAL_OINTMENT | Freq: Two times a day (BID) | CUTANEOUS | Status: DC
  Administered 2018-04-01 (×2): 1 via NASAL
  Filled 2018-04-01 (×2): qty 22

## 2018-04-01 MED ORDER — CHLORHEXIDINE GLUCONATE CLOTH 2 % EX PADS
6.0000 | MEDICATED_PAD | Freq: Every day | CUTANEOUS | Status: DC
  Administered 2018-04-02: 6 via TOPICAL

## 2018-04-01 MED ORDER — INFLUENZA VAC SPLIT HIGH-DOSE 0.5 ML IM SUSY
0.5000 mL | PREFILLED_SYRINGE | INTRAMUSCULAR | Status: AC
  Administered 2018-04-02: 0.5 mL via INTRAMUSCULAR
  Filled 2018-04-01: qty 0.5

## 2018-04-01 NOTE — Progress Notes (Signed)
[facility] 6N 20 - [facility] ([facility]) - RN GIP Visit at 10:00am  This is a related and covered GIP admission of 03/31/18 with a [facility] diagnosis of Cerebrovascular disease per Dr. Anne Fu, [facility] Physician. [facility] activated EMS on 03/31/18 after discovering the patient had fallen in her room at the facility. [facility] was notified at 9:20am. Patient arrived at the [facility] ER at 9:40am with complaints of right shoulder and back pain. Patient was admitted for Fall and transported to [facility] room 6N 20 at 20:07pm on 03/31/18. Patient has a DNR.  [facility] GIP Day: 1  Visited patient room with [facility] PMT NP Megan - Patient alert/talkative in NAD sitting up in bed with no complaints of pain at this time stating "just feeling stiff".  Patient had just finished 50% of breakfast without assistance. Patient is using a purewick urinary device and is currently tolerating room air without difficulty.  Spoke with Daughter [name] as we left room, who shared concerns of safety and pain control issues. Supported patient daughter and discussed current pain level and medication administered over last 12 hours. Daughter encouraged to ask questions/express concerns. Patient Daughter verbalized satisfaction with pain control at this time and agreed to current POC. Daughter also stated that she is working with Facility to address safety concerns and hoping to move patient room closer to nursing station at facility. Notified patient Daughter that [facility] RN will evaluate patient for increased needs for Hospice aide services once back in facility.     Per Epic Notes: Internal Medicine MD: No acute injuries on CT head/CT spine. Admit with oral and IV pain medications as needed. Resume home medications for CAD, GERD, Glaucoma and remain on Lovenox/Statins. Consulted PMT. Scheduled Pain Medications: Oxycodone IR tab 10mg  BID PRN Medications: Morphine 1mg  inj adm last at 02:14am on 04/01/18.  No scheduled  continuous medications at this time.   Goals of Care: Family wishes to continue to pursue comfort care and return to [facility] with support of hospice.   Discharge Planning: [facility] will continue to follow and anticipate discharge needs during patient hospitalization. Updated [facility] CMRN Heather on patient Daughter conversation about safety concerns and daughter's plan to work with [facility] to increase safety in facility.  Should ambulance transport be needed at time of discharge, please call [facility] [phone]) as [facility] contracts with [facility] for our patients.   Communication with [facility]; Spoke to patient daughter [name] outside patient room to support/listen to concerns of patient safety and pain control. Encouraged patient daughter to call for questions or concerns, giving her [facility] contact information. Notified Daughter that [facility] will continue to follow daily during hospitalization.   Communication with [facility]: [facility] team updated on patient condition, including [facility] RN and [facility] SW.   [facility] Medication List and Transfer Summary place on patient's [facility] shadow chart.   Please call with any hospice related concerns or questions,  Roda Shutters, RN [facility] Liaison  [phone]  [facility] Liaisons found on AMION

## 2018-04-01 NOTE — Plan of Care (Signed)
  Problem: Education: Goal: Knowledge of General Education information will improve Description Including pain rating scale, medication(s)/side effects and non-pharmacologic comfort measures Outcome: Progressing Note:  POC and orders received with pt.; pt. forgetful.

## 2018-04-01 NOTE — Progress Notes (Signed)
[facility] PROGRESS NOTE  SHLEY PICKLE KZS:[id number] DOB: 19-Oct-1919 DOA: 03/31/2018 PCP: Pearson Grippe, MD   Narrative: 84 year old female with prior CVA HTN bipolar reflux spinal stenosis and bilateral hip replacements admitted with fall-trauma was consulted-she is on hospice for functional limitations and prior history of bradycardia and this was started about 2 months prior   A & Plan Fall-get orthostatics-we will need PT to evaluate-this may rescind hospice benefits and patient's family is aware of the same-I have stopped IV morphine patient can use OxyIR 10 and or Percocet if there is a need would hold IV pain meds Bipolar-continue Cymbalta 30 daily, Remeron 15 at bedtime Xanax 0.125 as needed sleep-would probably discontinue the Xanax at night if there are recurrent episodes of confusion HTN continue metoprolol 12.5 twice daily Lasix 20 daily HLD continue atorvastatin 80 daily , Continue eyedrops Seasonal allergies continue inhalers and nasal spray    DVT prophylaxis: Lovenox code Status: DNR family Communication: Discussed with daughter who is an Char[employer] fundraiser disposition Plan: Inpatient pending therapy evaluations and reassessment   Lamiyah Schlotter, MD  [facility] Direct contact: [phone] --Via amion app OR  --[email or web address]; password [email or web address]  7PM-7AM contact night coverage as above 04/01/2018, 8:13 AM  LOS: 0 days   Consultants:  None currently  Procedures:  No  Antimicrobials:  No  Interval history/Subjective: Awake alert pleasant no distress Able to move all 4 limbs equally without deficit smile is symmetric She is not eating too much Her pain is controlled  Objective:  Vitals:  Vitals:   03/31/18 1958 04/01/18 0434  BP: 139/61 (!) 174/56  Pulse: 83 80  Resp: 17 16  Temp: 98.1 F (36.7 C) 98.2 F (36.8 C)  SpO2: 94% 96%    Exam:  . EOMI NCAT no distress . Chest clear . S1-S2 holosystolic murmur LUSE 2nd . Abdomen soft nontender no rebound   I  have personally reviewed the following:   Labs:  I have reviewed the labs and there are no abnormalities other than an A1c of 5.4  Imaging studies:  None  Medical tests:  None  Test discussed with performing physician:  None  Decision to obtain old records:  None  Review and summation of old records:  None  Scheduled Meds: . ALPRAZolam  0.125 mg Oral QHS  . atorvastatin  80 mg Oral q1800  . calcium citrate  200 mg of elemental calcium Oral Daily  . Chlorhexidine Gluconate Cloth  6 each Topical Q0600  . cholecalciferol  1,000 Units Oral Daily  . clopidogrel  75 mg Oral Daily  . DULoxetine  30 mg Oral Daily  . enoxaparin (LOVENOX) injection  40 mg Subcutaneous Q24H  . famotidine  20 mg Oral Daily  . fluticasone  1 spray Each Nare BID  . furosemide  20 mg Oral Daily  . loteprednol  1 drop Both Eyes BID  . metoprolol tartrate  12.5 mg Oral BID  . mirtazapine  15 mg Oral QHS  . mupirocin ointment  1 application Nasal BID  . oxyCODONE  10 mg Oral BID  . polyethylene glycol  17 g Oral Daily  . potassium chloride  10 mEq Oral Daily  . psyllium  1 packet Oral Daily  . senna-docusate  1 tablet Oral QHS  . timolol  1 drop Both Eyes BID   Continuous Infusions:  Active Problems:   Fall   LOS: 0 days

## 2018-04-01 NOTE — Progress Notes (Signed)
Pt. transported from ER via stretcher to 6N-20; moved pt. from stretcher to bed; pt. little anxious/restless- will give meds ordered. Daughter [name] for short period and left; stated staff can call her if needed. Pt. alert and oriented x1 self, and occ. to place. c/o's pain (R) leg- too early for pain med and informed pt. Pt. oriented to call button and room.

## 2018-04-01 NOTE — Consult Note (Signed)
Consultation Note Date: 04/01/2018   Patient Name: Yvette Carroll  DOB: 1920/05/08  MRN: [id number]  Age / Sex: 84 y.o., female  PCP: Jani Gravel, MD Referring Physician: Nita Sells, MD  Reason for Consultation: Establishing goals of care  HPI/Patient Profile: 84 y.o. female  with past medical history of CVA with residual dysphagia, diastolic cardiomyopathy, CAD, aortic stenosis, hypertension, depression, GERD, spinal stenosis, chronic back pain, osteoporosis/osteoarthritis s/p bilateral hip replacements, vertigo admitted on 03/31/2018 after fall from nursing facility. No acute injuries noted on CT head/CT spine. Pelvic xray revealed old right superior and inferior pubic rami fractures with no new injuries. Patient admitted for symptom management. Current hospice patient with diagnosis of CAD, diastolic cardiomyopathy, and aortic stenosis.  Clinical Assessment and Goals of Care:  I have reviewed medical records, discussed with care team, and initially met with patient and hospice nurse at bedside. Patient pleasant this morning and appears comfortable. She denies pain. She is mildly confused, for which we reoriented her to the hospital.   Shortly after, daughter Butch Penny) arrived. Hospice RN Judeen Hammans) and I spoke with her in hallway.    I introduced Palliative Medicine as specialized medical care for people living with serious illness. It focuses on providing relief from the symptoms and stress of a serious illness. The goal is to improve quality of life for both the patient and the family.  Reviewed hospital diagnoses, interventions, and test results with daughter.   Yvette Carroll has been receiving hospice services for about two months. Butch Penny is happy with the care her mother receives through hospice services. She is most worried about her mother's safety and symptom management. Butch Penny has been in contact with staff  at [facility] and is trying to get her mother moved to a room closer to nurses station so she can be better monitored.   Discussed Yvette Carroll's current symptom management medication. Butch Penny tells me she has been on xanax and oxycodone for many years. We discussed possibly scheduling tylenol in order to stay on top of pain control. Also low dose xanax BID if she feels anxious during the day. Butch Penny tells me her mother likes being in "control" and becomes anxious when she cannot control certain situations. Butch Penny is content with current medication regimen and glad to see her mother's pain is controlled today.   Butch Penny asks about physical therapy evaluation at [facility]. Explained that she would have to revoke hospice services if wanting aggressive physical therapy for her mother at SNF.   Questions and concerns were addressed. PMT contact information given.     SUMMARY OF RECOMMENDATIONS    Continue current medical management including symptom management regimen.  Current hospice patient. Daughter wishes to continue hospice services at [facility] on discharge. Daughter understands if she wishes to pursue physical therapy at SNF she will need to revoke hospice services. [facility] following this admission.  Daughter in communication with staff at [facility]. Hoping to get her mother moved to a room closer to nursing station.    Code Status/Advance Care Planning:  DNR  Symptom Management:   Continue Oxycodone '10mg'$  BID (current home dose)  Recommend Xanax 0.'125mg'$  PO BID prn anxiety/sleep  Recommend scheduling tylenol if pain is not managed. Will continue home dose of prn tylenol '500mg'$  TID PRN.   Palliative Prophylaxis:   Aspiration, Delirium Protocol, Frequent Pain Assessment, Oral Care and Turn Reposition  Psycho-social/Spiritual:   Desire for further Chaplaincy support:yes  Additional Recommendations: Caregiving  Support/Resources and Education on Hospice  Prognosis:   Unable to determine  Discharge Planning:  To Be Determined      Primary Diagnoses: Present on Admission: . Fall   I have reviewed the medical record, interviewed the patient and family, and examined the patient. The following aspects are pertinent.  Past Medical History:  Diagnosis Date  . Depression   . GERD (gastroesophageal reflux disease)   . Glaucoma   . Hypertension   . Lumbar spinal stenosis   . Osteoporosis   . Right knee DJD   . Vertigo    Social History   Socioeconomic History  . Marital status: Widowed    Spouse name: Not on file  . Number of children: Not on file  . Years of education: Not on file  . Highest education level: Not on file  Occupational History  . Not on file  Social Needs  . Financial resource strain: Not on file  . Food insecurity:    Worry: Not on file    Inability: Not on file  . Transportation needs:    Medical: Not on file    Non-medical: Not on file  Tobacco Use  . Smoking status: Never Smoker  . Smokeless tobacco: Never Used  Substance and Sexual Activity  . Alcohol use: No  . Drug use: No  . Sexual activity: Not on file  Lifestyle  . Physical activity:    Days per week: Not on file    Minutes per session: Not on file  . Stress: Not on file  Relationships  . Social connections:    Talks on phone: Not on file    Gets together: Not on file    Attends religious service: Not on file    Active member of club or organization: Not on file    Attends meetings of clubs or organizations: Not on file    Relationship status: Not on file  Other Topics Concern  . Not on file  Social History Narrative  . Not on file   No family history on file. Scheduled Meds: . atorvastatin  80 mg Oral q1800  . calcium citrate  200 mg of elemental calcium Oral Daily  . Chlorhexidine Gluconate Cloth  6 each Topical Q0600  . cholecalciferol  1,000 Units Oral Daily  . clopidogrel  75 mg Oral Daily  . DULoxetine  30 mg Oral Daily  . enoxaparin (LOVENOX) injection  40 mg Subcutaneous  Q24H  . famotidine  20 mg Oral Daily  . fluticasone  1 spray Each Nare BID  . furosemide  20 mg Oral Daily  . [START ON 04/02/2018] Influenza vac split quadrivalent PF  0.5 mL Intramuscular Tomorrow-1000  . loteprednol  1 drop Both Eyes BID  . metoprolol tartrate  12.5 mg Oral BID  . mirtazapine  15 mg Oral QHS  . mupirocin ointment  1 application Nasal BID  . oxyCODONE  10 mg Oral BID  . polyethylene glycol  17 g Oral Daily  . potassium chloride  10 mEq Oral Daily  . psyllium  1 packet Oral  Daily  . senna-docusate  1 tablet Oral QHS  . timolol  1 drop Both Eyes BID   Continuous Infusions: PRN Meds:.acetaminophen, ALPRAZolam, loratadine, sodium chloride Medications Prior to Admission:  Prior to Admission medications   Medication Sig Start Date End Date Taking? Authorizing Provider  acetaminophen (TYLENOL) 500 MG tablet Take 500 mg by mouth 3 (three) times daily as needed for moderate pain.    Yes [provider]  ALPRAZolam (XANAX) 0.25 MG tablet Take 0.5 tablets (0.125 mg total) by mouth at bedtime. 09/26/17  Yes Samuella Cota, MD  antiseptic oral rinse (BIOTENE) LIQD 15 mLs by Mouth Rinse route 3 (three) times daily. May self administer   Yes [provider]  atorvastatin (LIPITOR) 80 MG tablet Take 1 tablet (80 mg total) by mouth daily at 6 PM. 09/26/17  Yes Samuella Cota, MD  bimatoprost (LUMIGAN) 0.01 % SOLN Place 1 drop into both eyes every evening.    Yes [provider]  calcium citrate (CALCITRATE - DOSED IN MG ELEMENTAL CALCIUM) 950 MG tablet Take 200 mg of elemental calcium by mouth daily.    Yes [provider]  cholecalciferol (VITAMIN D) 1000 units tablet Take 1,000 Units by mouth daily.    Yes [provider]  clopidogrel (PLAVIX) 75 MG tablet Take 1 tablet (75 mg total) by mouth daily. 09/27/17  Yes Samuella Cota, MD  DULoxetine (CYMBALTA) 30 MG capsule Take 30 mg by mouth daily.   Yes [provider]    ENSURE PLUS (ENSURE PLUS) LIQD Take 237 mLs by mouth daily.    Yes [provider]  fluticasone (FLONASE) 50 MCG/ACT nasal spray Place 1 spray into both nostrils 2 (two) times daily.    Yes [provider]  furosemide (LASIX) 20 MG tablet Take 20 mg by mouth daily.   Yes [provider]  hydrocortisone cream 0.5 % Apply 1 application topically as needed for itching.   Yes [provider]  lidocaine (LIDODERM) 5 % Place 1 patch onto the skin daily as needed (for knee, hip and back pain). Remove & Discard patch within 12 hours or as directed by MD   Yes [provider]  loratadine (CLARITIN) 10 MG tablet Take 10 mg by mouth daily as needed (for runny nose or sinus symptoms).    Yes [provider]  loteprednol (LOTEMAX) 0.5 % ophthalmic suspension Place 1 drop into both eyes 2 (two) times daily.    Yes [provider]  Menthol, Topical Analgesic, (BIOFREEZE) 4 % GEL Apply 1 application topically 2 (two) times daily. Both knees   Yes [provider]  mirtazapine (REMERON) 15 MG tablet Take 15 mg by mouth at bedtime.   Yes [provider]  morphine 20 MG/5ML solution Take 5 mg by mouth every 4 (four) hours as needed for pain.    Yes [provider]  oxycodone (OXY-IR) 5 MG capsule Take 5 mg by mouth 2 (two) times daily as needed for pain.   Yes [provider]  Oxycodone HCl 10 MG TABS Take 1 tablet (10 mg total) by mouth 2 (two) times daily. 09/26/17  Yes Samuella Cota, MD  Polyethyl Glycol-Propyl Glycol (SYSTANE) 0.4-0.3 % SOLN Place 1 drop into both eyes 4 (four) times daily as needed (for dry eyes).    Yes [provider]  polyethylene glycol (MIRALAX / GLYCOLAX) packet Take 17 g by mouth daily.   Yes [provider]  potassium chloride (K-DUR,KLOR-CON)  10 MEQ tablet Take 10 mEq by mouth daily. With Lasix   Yes [provider]  psyllium (METAMUCIL) 58.6 % powder Take 1  packet by mouth daily. 3.4 grams   Yes [provider]  ranitidine (ZANTAC) 150 MG tablet Take 150 mg by mouth 2 (two) times daily.   Yes [provider]  senna-docusate (SENEXON-S) 8.6-50 MG tablet Take 1 tablet by mouth at bedtime.   Yes [provider]  sodium chloride (OCEAN) 0.65 % SOLN nasal spray Place 1 spray into both nostrils as needed for congestion.   Yes [provider]  timolol (BETIMOL) 0.25 % ophthalmic solution Place 2 drops into both eyes 2 (two) times daily.    Yes [provider]   Allergies  Allergen Reactions  . Betadine [Povidone Iodine] Itching and Rash    Reaction is SEVERE  . Lisinopril Anaphylaxis and Swelling    Throat swells  . Shellfish Allergy Anaphylaxis  . Macrolides And Ketolides Other (See Comments)    Per MAR  . Penicillins Swelling    Site of swelling?? Has patient had a PCN reaction causing immediate rash, facial/tongue/throat swelling, SOB or lightheadedness with hypotension: Yes Has patient had a PCN reaction causing severe rash involving mucus membranes or skin necrosis: Unk Has patient had a PCN reaction that required hospitalization: Unk Has patient had a PCN reaction occurring within the last 10 years: Unk If all of the above answers are "NO", then may proceed with Cephalosporin use.   Marland Kitchen Risperdal [Risperidone] Other (See Comments)    Per MAR  . Iodine Rash   Review of Systems  Neurological: Positive for weakness.   Physical Exam  Constitutional: She is cooperative.  HENT:  Head: Normocephalic and atraumatic.  Pulmonary/Chest: No accessory muscle usage. No tachypnea. No respiratory distress.  Neurological: She is alert.  Oriented to person/place. Mild dementia  Skin: Skin is warm and dry.  Psychiatric: She has a normal mood and affect. Her speech is normal and behavior is normal. Cognition and memory are normal.  Nursing note and vitals reviewed.  Vital Signs: BP (!) 155/57 (BP Location:  Right Arm)   Pulse 71   Temp 98 F (36.7 C) (Oral)   Resp 16   SpO2 98%  Pain Scale: 0-10   Pain Score: 4    SpO2: SpO2: 98 % O2 Device:SpO2: 98 % O2 Flow Rate: .   IO: Intake/output summary:   Intake/Output Summary (Last 24 hours) at 04/01/2018 1713 Last data filed at 04/01/2018 0436 Gross per 24 hour  Intake -  Output 1000 ml  Net -1000 ml    LBM: Last BM Date: 03/30/18 Baseline Weight:   Most recent weight:       Palliative Assessment/Data: PPS 40%   Flowsheet Rows     Most Recent Value  Intake Tab  Referral Department  Hospitalist  Unit at Time of Referral  Med/Surg Unit  Palliative Care Primary Diagnosis  -- [Fall, multiple co-morbidities]  Palliative Care Type  New Palliative care  Reason for referral  Clarify Goals of Care  Date first seen by Palliative Care  04/01/18  Clinical Assessment  Palliative Performance Scale Score  40%  Psychosocial & Spiritual Assessment  Palliative Care Outcomes  Patient/Family meeting held?  Yes  Who was at the meeting?  patient, daughter, hospice nurse  Palliative Care Outcomes  Clarified goals of care, Counseled regarding hospice, Provided psychosocial or spiritual support, Improved pain interventions, Improved non-pain symptom therapy  Time In: 1010 Time Out: 1050 Time Total: 63mn Greater than 50%  of this time was spent counseling and coordinating care related to the above assessment and plan.  Signed by:  MIhor Dow FNP-C Palliative Medicine Team  Phone: [phone]: [phone]  Please contact Palliative Medicine Team phone at [phone] questions and concerns.  For individual provider: See AShea Evans

## 2018-04-01 NOTE — Evaluation (Addendum)
Physical Therapy Evaluation Patient Details Name: Yvette Carroll MRN: [id number] DOB: 08-20-1919 Today's Date: 04/01/2018   History of Present Illness  Pt is a 84 y.o. female admitted from ALF on 03/31/18 after falling onto buttocks with c/o sacral pain. Hip, head, and neck imaging without acute abnormality. PMH includes glaucoma, osteoporosis, HTN, R knee DJD, vertigo, lumbar spinal stenosis, CVA.    Clinical Impression  Pt presents with generalized weakness, RLE pain, increased confusion, and an overall decrease in functional mobility secondary to above. PTA, pt resides at [facility], mod indep transfer to w/c with RW. Today, pt requiring mod-maxA to perform similar transfers from bed/recliner/BSC; limited by pain, weakness, and decreased cognition. Increased time spent in discussion with daughter regarding recommendation for SNF-level therapies. Will follow acutely to address established goals.   Orthostatic BPs  Supine 137/67  Sitting 150/72  Post-standing transfer 157/67      Follow Up Recommendations SNF;Supervision/Assistance - 24 hour    Equipment Recommendations  None recommended by PT    Recommendations for Other Services       Precautions / Restrictions Precautions Precautions: Fall Restrictions Weight Bearing Restrictions: No      Mobility  Bed Mobility Overal bed mobility: Needs Assistance Bed Mobility: Supine to Sit     Supine to sit: Mod assist;HOB elevated     General bed mobility comments: Pt able to initiate moving BLEs to EOB, but becoming confused and no longer following instruction, eventually requiring modA to assist hips to EOB  Transfers Overall transfer level: Needs assistance Equipment used: 1 person hand held assist Transfers: Sit to/from [employer] Sit to Stand: Mod assist Stand pivot transfers: Max assist;Mod assist       General transfer comment: Performed stand pivot from bed>recliner>BSC>recliner with HHA and  mod-maxA. Pt never achieving fully upright posture despite max cues. Adamant to reach with UE towards arm rest while holding onto PT with other UE  Ambulation/Gait             General Gait Details: NT  Stairs            Wheelchair Mobility    Modified Rankin (Stroke Patients Only)       Balance Overall balance assessment: Needs assistance   Sitting balance-Leahy Scale: Fair Sitting balance - Comments: Cannot accept challenge sitting EOB     Standing balance-Leahy Scale: Poor                               Pertinent Vitals/Pain Pain Assessment: Faces Faces Pain Scale: Hurts little more Pain Location: R hip Pain Descriptors / Indicators: Sore;Guarding Pain Intervention(s): Limited activity within patient's tolerance;Monitored during session    Home Living Family/patient expects to be discharged to:: Skilled nursing facility                 Additional Comments: Pt from ALF side of [facility] (per daughter, not memory care, although chart states this)    Prior Function Level of Independence: Needs assistance   Gait / Transfers Assistance Needed: Pt mostly indep to stand pivot to w/c with RW; w/c for functional mobility  ADL's / Homemaking Assistance Needed: Reports she does her ADLs with assistance as needed  Comments: Fall leading to admission occurred when pt attempting to transfers bed<>w/c     Hand Dominance        Extremity/Trunk Assessment   Upper Extremity Assessment Upper Extremity Assessment: Generalized  weakness    Lower Extremity Assessment Lower Extremity Assessment: Generalized weakness    Cervical / Trunk Assessment Cervical / Trunk Assessment: Kyphotic  Communication   Communication: HOH  Cognition Arousal/Alertness: Awake/alert Behavior During Therapy: WFL for tasks assessed/performed Overall Cognitive Status: Impaired/Different from baseline Area of Impairment: Orientation;Attention;Memory;Following  commands;Safety/judgement;Awareness;Problem solving                 Orientation Level: Disoriented to;Place;Time;Situation Current Attention Level: Sustained Memory: Decreased short-term memory Following Commands: Follows one step commands inconsistently Safety/Judgement: Decreased awareness of safety;Decreased awareness of deficits Awareness: Intellectual Problem Solving: Slow processing;Requires verbal cues General Comments: Daughter reports baseline cognitive impairments, but pt more combative with increased confusion than normal (i.e. pt asking about going home to husband although husband passed multiple years ago and normally pt knows this)      General Comments General comments (skin integrity, edema, etc.): Daughter Yvette Carroll - RN at [facility]) present     Exercises     Assessment/Plan    PT Assessment Patient needs continued PT services  PT Problem List Decreased strength;Decreased activity tolerance;Decreased balance;Decreased mobility;Decreased knowledge of use of DME;Decreased cognition;Decreased safety awareness;Pain       PT Treatment Interventions DME instruction;Gait training;Functional mobility training;Therapeutic activities;Therapeutic exercise;Balance training;Patient/family education;Cognitive remediation;Wheelchair mobility training    PT Goals (Current goals can be found in the Care Plan section)  Acute Rehab PT Goals Patient Stated Goal: Get higher level of rehab/care if that's an option before returning to ALF with hospice PT Goal Formulation: With family Time For Goal Achievement: 04/15/18 Potential to Achieve Goals: Fair    Frequency Min 2X/week   Barriers to discharge        Co-evaluation               AM-PAC PT "6 Clicks" Daily Activity  Outcome Measure Difficulty turning over in bed (including adjusting bedclothes, sheets and blankets)?: Unable Difficulty moving from lying on back to sitting on the side of the bed? :  Unable Difficulty sitting down on and standing up from a chair with arms (e.g., wheelchair, bedside commode, etc,.)?: Unable Help needed moving to and from a bed to chair (including a wheelchair)?: A Lot Help needed walking in hospital room?: Total Help needed climbing 3-5 steps with a railing? : Total 6 Click Score: 7    End of Session Equipment Utilized During Treatment: Gait belt Activity Tolerance: Patient limited by pain Patient left: in chair;with call bell/phone within reach;with family/visitor present;with chair alarm set Nurse Communication: Mobility status PT Visit Diagnosis: Other abnormalities of gait and mobility (R26.89);Muscle weakness (generalized) (M62.81);Repeated falls (R29.6)    Time: 1610-9604 PT Time Calculation (min) (ACUTE ONLY): 32 min   Charges:   PT Evaluation $PT Eval Moderate Complexity: 1 Mod PT Treatments $Therapeutic Activity: 8-22 mins       Ina Homes, PT, DPT Acute Rehabilitation Services  Pager [phone] Office [phone]  Malachy Chamber 04/01/2018, 4:44 PM

## 2018-04-01 NOTE — Progress Notes (Signed)
Palliative Medicine RN Note: Consult order noted. PMT will assist with pain management. Pt is active with [facility], who will address d/c planning.   PMT NP Vennie Homans, DNP will available later today to see her.  Yvette Chance Shalane Florendo, RN, BSN, [facility] Palliative Medicine Team 04/01/2018 9:19 AM Office [phone]

## 2018-04-01 NOTE — Progress Notes (Signed)
CRITICAL VALUE ALERT  Critical Value:  MRSA PCR- positive  Date & Time Notied: 03/31/18 2346  Provider Notified: Leodis Rains -text page sent- 04/01/18 0505  Orders Received/Actions taken: standing orders per protocol

## 2018-04-01 NOTE — Progress Notes (Signed)
Attempted to take orthostatic vitals.  Pt was somewhat argumentative and only agreed to b/p  lying down 151/100 and sitting up 140/64. Will wait for PT assessment for a complete reading.

## 2018-04-02 DIAGNOSIS — R1310 Dysphagia, unspecified: Secondary | ICD-10-CM | POA: Diagnosis present

## 2018-04-02 DIAGNOSIS — W19XXXA Unspecified fall, initial encounter: Secondary | ICD-10-CM

## 2018-04-02 DIAGNOSIS — I350 Nonrheumatic aortic (valve) stenosis: Secondary | ICD-10-CM | POA: Diagnosis present

## 2018-04-02 DIAGNOSIS — I428 Other cardiomyopathies: Secondary | ICD-10-CM | POA: Diagnosis present

## 2018-04-02 DIAGNOSIS — Z96643 Presence of artificial hip joint, bilateral: Secondary | ICD-10-CM | POA: Diagnosis present

## 2018-04-02 DIAGNOSIS — W010XXA Fall on same level from slipping, tripping and stumbling without subsequent striking against object, initial encounter: Secondary | ICD-10-CM | POA: Diagnosis present

## 2018-04-02 DIAGNOSIS — Z79899 Other long term (current) drug therapy: Secondary | ICD-10-CM | POA: Diagnosis not present

## 2018-04-02 DIAGNOSIS — Z90710 Acquired absence of both cervix and uterus: Secondary | ICD-10-CM | POA: Diagnosis not present

## 2018-04-02 DIAGNOSIS — Z23 Encounter for immunization: Secondary | ICD-10-CM | POA: Diagnosis not present

## 2018-04-02 DIAGNOSIS — Z7189 Other specified counseling: Secondary | ICD-10-CM | POA: Diagnosis not present

## 2018-04-02 DIAGNOSIS — I10 Essential (primary) hypertension: Secondary | ICD-10-CM | POA: Diagnosis present

## 2018-04-02 DIAGNOSIS — Z880 Allergy status to penicillin: Secondary | ICD-10-CM | POA: Diagnosis not present

## 2018-04-02 DIAGNOSIS — M810 Age-related osteoporosis without current pathological fracture: Secondary | ICD-10-CM | POA: Diagnosis present

## 2018-04-02 DIAGNOSIS — F418 Other specified anxiety disorders: Secondary | ICD-10-CM | POA: Diagnosis present

## 2018-04-02 DIAGNOSIS — M25551 Pain in right hip: Secondary | ICD-10-CM | POA: Diagnosis present

## 2018-04-02 DIAGNOSIS — Z91013 Allergy to seafood: Secondary | ICD-10-CM | POA: Diagnosis not present

## 2018-04-02 DIAGNOSIS — E785 Hyperlipidemia, unspecified: Secondary | ICD-10-CM | POA: Diagnosis present

## 2018-04-02 DIAGNOSIS — K219 Gastro-esophageal reflux disease without esophagitis: Secondary | ICD-10-CM | POA: Diagnosis present

## 2018-04-02 DIAGNOSIS — Z66 Do not resuscitate: Secondary | ICD-10-CM | POA: Diagnosis present

## 2018-04-02 DIAGNOSIS — J309 Allergic rhinitis, unspecified: Secondary | ICD-10-CM | POA: Diagnosis present

## 2018-04-02 DIAGNOSIS — Z7951 Long term (current) use of inhaled steroids: Secondary | ICD-10-CM | POA: Diagnosis not present

## 2018-04-02 DIAGNOSIS — Z993 Dependence on wheelchair: Secondary | ICD-10-CM | POA: Diagnosis not present

## 2018-04-02 DIAGNOSIS — H409 Unspecified glaucoma: Secondary | ICD-10-CM | POA: Diagnosis present

## 2018-04-02 DIAGNOSIS — I2510 Atherosclerotic heart disease of native coronary artery without angina pectoris: Secondary | ICD-10-CM | POA: Diagnosis present

## 2018-04-02 DIAGNOSIS — F329 Major depressive disorder, single episode, unspecified: Secondary | ICD-10-CM | POA: Diagnosis present

## 2018-04-02 DIAGNOSIS — I69391 Dysphagia following cerebral infarction: Secondary | ICD-10-CM | POA: Diagnosis not present

## 2018-04-02 DIAGNOSIS — Z888 Allergy status to other drugs, medicaments and biological substances status: Secondary | ICD-10-CM | POA: Diagnosis not present

## 2018-04-02 MED ORDER — HALOPERIDOL LACTATE 5 MG/ML IJ SOLN
0.5000 mg | Freq: Once | INTRAMUSCULAR | Status: AC
  Administered 2018-04-02: 0.5 mg via INTRAVENOUS
  Filled 2018-04-02: qty 1

## 2018-04-02 MED ORDER — METOPROLOL TARTRATE 25 MG PO TABS: 12.5000 mg | ORAL_TABLET | Freq: Two times a day (BID) | ORAL | Status: AC

## 2018-04-02 MED ORDER — ALPRAZOLAM 0.25 MG PO TABS: 0.1250 mg | ORAL_TABLET | Freq: Every day | ORAL | 0 refills | Status: DC

## 2018-04-02 MED ORDER — ALPRAZOLAM 0.25 MG PO TABS: 0.1250 mg | ORAL_TABLET | Freq: Every day | ORAL | 0 refills | Status: AC

## 2018-04-02 MED ORDER — OXYCODONE HCL 5 MG PO CAPS: 5.0000 mg | ORAL_CAPSULE | Freq: Two times a day (BID) | ORAL | 0 refills | Status: AC | PRN

## 2018-04-02 MED ORDER — MORPHINE SULFATE 20 MG/5ML PO SOLN: 5.0000 mg | ORAL | 0 refills | Status: AC | PRN

## 2018-04-02 MED ORDER — POLYETHYLENE GLYCOL 3350 17 G PO PACK
17.0000 g | PACK | Freq: Every day | ORAL | Status: DC
  Administered 2018-04-02: 17 g via ORAL

## 2018-04-02 MED ORDER — OXYCODONE HCL 10 MG PO TABS: 10.0000 mg | ORAL_TABLET | Freq: Two times a day (BID) | ORAL | 0 refills | Status: AC

## 2018-04-02 NOTE — Social Work (Signed)
CSW called [facility] at request of pt daughter [name], await return call.   [name], LCSWA [facility] Clinical Social Work [phone]

## 2018-04-02 NOTE — Care Management Note (Signed)
Case Management Note  Patient Details  Name: Yvette Carroll MRN: [id number] Date of Birth: 21-Jul-1919  Subjective/Objective:                    Action/Plan:  Received message to call patient's daughter Nevada Crane [phone]. Spoke with [name], she has had made arrangements for her mother to return to [facility] this morning and would like a early discharge. Paged MD , SW aware, [name] has spoken with Cordelia Pen with [facility] Expected Discharge Date:                  Expected Discharge Plan:  Assisted Living / Rest Home  In-House Referral:  Clinical Social Work  Discharge planning Services     Post Acute Care Choice:    Choice offered to:  Adult Children  DME Arranged:  N/A DME Agency:  NA  HH Arranged:  NA HH Agency:  NA  Status of Service:  In process, will continue to follow  If discussed at Long Length of Stay Meetings, dates discussed:    Additional Comments:  Kingsley Plan, RN 04/02/2018, 9:43 AM

## 2018-04-02 NOTE — Progress Notes (Signed)
Patient agitated and restless all night long. One time dose of Haldol 0.5mg  IV given around midnight but no change. Patient continues to try and get up out of bed.

## 2018-04-02 NOTE — Clinical Social Work Placement (Addendum)
   CLINICAL SOCIAL WORK PLACEMENT  NOTE [facility]  Date:  04/02/2018  Patient Details  Name: Yvette Carroll MRN: [id number] Date of Birth: 06/17/1920  Clinical Social Work is seeking post-discharge placement for this patient at the Assisted Living Facility level of care (*CSW will initial, date and re-position this form in  chart as items are completed):  No   Patient/family provided with [facility] Clinical Social Work Department's list of facilities offering this level of care within the geographic area requested by the patient (or if unable, by the patient's family).  Yes   Patient/family informed of their freedom to choose among providers that offer the needed level of care, that participate in Medicare, Medicaid or managed care program needed by the patient, have an available bed and are willing to accept the patient.  No   Patient/family informed of [facility]'s ownership interest in [facility] and [facility], as well as of the fact that they are under no obligation to receive care at these facilities.  PASRR submitted to EDS on       PASRR number received on       Existing PASRR number confirmed on       FL2 transmitted to all facilities in geographic area requested by pt/family on       FL2 transmitted to all facilities within larger geographic area on       Patient informed that his/her managed care company has contracts with or will negotiate with certain facilities, including the following:        Yes   Patient/family informed of bed offers received.  Patient chooses bed at [facility]     Physician recommends and patient chooses bed at      Patient to be transferred to [facility] on 04/02/18.  Patient to be transferred to facility by [facility]   Patient family notified on 04/02/18 of transfer.  Name of family member notified:  pt daughter [name] at bedside     PHYSICIAN       Additional Comment:     _______________________________________________ [name], LCSWA 04/02/2018, 1:28 PM

## 2018-04-02 NOTE — Social Work (Signed)
Per discussion with RN Case Management this morning pt family has arranged for pt to return to ALF, not to SNF, with hospice continuing to follow. CSW will page MD for updated summary.   [name], LCSWA [facility] Clinical Social Work [phone]

## 2018-04-02 NOTE — [place] FL2 (Signed)
[place] MEDICAID FL2 LEVEL OF CARE SCREENING TOOL     IDENTIFICATION  Patient Name: Yvette Carroll Birthdate: 11/21/1919 Sex: female Admission Date (Current Location): 03/31/2018  [facility] and [place] Number:  Producer, television/film/video and Address:  [facility] [place], [address] [place], [place] [address]      Provider Number: [id number]  Attending Physician Name and Address:  Marinda Elk, MD  Relative Name and Phone Number:  [place] Crane, daughter, [phone]    Current Level of Care: Hospital Recommended Level of Care: Assisted Living Facility Prior Approval Number:    Date Approved/Denied:   PASRR Number:    Discharge Plan: Other (Comment)(ALF ([facility]))    Current Diagnoses: Patient Active Problem List   Diagnosis Date Noted  . Palliative care by specialist   . Goals of care, counseling/discussion   . Chronic back pain   . Fall 03/31/2018  . Acute CVA (cerebrovascular accident) (HCC) 09/25/2017  . Benign essential HTN 09/25/2017  . Depression with anxiety 09/24/2017  . Acute posthemorrhagic anemia 05/27/2013  . Chronic low back pain 04/30/2013  . Glaucoma 04/30/2013  . Allergic rhinitis 04/30/2013  . GERD (gastroesophageal reflux disease) 04/30/2013  . CAD (coronary artery disease) 04/30/2013  . Constipation 04/30/2013  . Hip fracture (HCC) 04/24/2013  . Anemia 04/24/2013    Orientation RESPIRATION BLADDER Height & Weight     Self  Normal Incontinent, External catheter Weight:   Height:     BEHAVIORAL SYMPTOMS/MOOD NEUROLOGICAL BOWEL NUTRITION STATUS      Continent Diet(regular diet)  AMBULATORY STATUS COMMUNICATION OF NEEDS Skin   Extensive Assist Verbally Normal                       Personal Care Assistance Level of Assistance  Bathing, Feeding, Dressing Bathing Assistance: Limited assistance Feeding assistance: Independent Dressing Assistance: Limited assistance     Functional Limitations Info  Sight,  Hearing, Speech Sight Info: Impaired Hearing Info: Impaired Speech Info: Adequate    SPECIAL CARE FACTORS FREQUENCY                       Contractures Contractures Info: Not present    Additional Factors Info  Code Status, Allergies, Isolation Precautions Code Status Info: DNR Allergies Info: BETADINE POVIDONE IODINE, LISINOPRIL, SHELLFISH ALLERGY, MACROLIDES AND KETOLIDES, PENICILLINS, RISPERDAL RISPERIDONE, IODINE      Isolation Precautions Info: MRSA     Current Medications (04/02/2018):  This is the current hospital active medication list Current Facility-Administered Medications  Medication Dose Route Frequency Provider Last Rate Last Dose  . acetaminophen (TYLENOL) tablet 500 mg  500 mg Oral TID PRN Alessandra Bevels, MD   500 mg at 04/02/18 1241  . ALPRAZolam Prudy Feeler) tablet 0.125 mg  0.125 mg Oral BID PRN Alita Chyle, NP   0.125 mg at 04/02/18 1241  . atorvastatin (LIPITOR) tablet 80 mg  80 mg Oral q1800 Alessandra Bevels, MD   80 mg at 04/01/18 1744  . calcium citrate (CALCITRATE - dosed in mg elemental calcium) tablet 200 mg of elemental calcium  200 mg of elemental calcium Oral Daily Alessandra Bevels, MD   200 mg of elemental calcium at 04/02/18 1023  . Chlorhexidine Gluconate Cloth 2 % PADS 6 each  6 each Topical Q0600 Alessandra Bevels, MD   6 each at 04/02/18 0644  . cholecalciferol (VITAMIN D) tablet 1,000 Units  1,000 Units Oral Daily Alessandra Bevels, MD   1,000  Units at 04/02/18 1024  . clopidogrel (PLAVIX) tablet 75 mg  75 mg Oral Daily Alessandra Bevels, MD   75 mg at 04/02/18 1024  . DULoxetine (CYMBALTA) DR capsule 30 mg  30 mg Oral Daily Alessandra Bevels, MD   30 mg at 04/02/18 1025  . enoxaparin (LOVENOX) injection 40 mg  40 mg Subcutaneous Q24H Alessandra Bevels, MD   40 mg at 04/01/18 2212  . famotidine (PEPCID) tablet 20 mg  20 mg Oral Daily Alessandra Bevels, MD   20 mg at 04/02/18 1024  . fluticasone (FLONASE) 50 MCG/ACT nasal spray 1 spray  1  spray Each Nare BID Alessandra Bevels, MD   1 spray at 04/01/18 2211  . furosemide (LASIX) tablet 20 mg  20 mg Oral Daily Alessandra Bevels, MD   20 mg at 04/02/18 1024  . loratadine (CLARITIN) tablet 10 mg  10 mg Oral Daily PRN Alessandra Bevels, MD      . loteprednol (LOTEMAX) 0.5 % ophthalmic suspension 1 drop  1 drop Both Eyes BID Alessandra Bevels, MD   1 drop at 04/01/18 2212  . metoprolol tartrate (LOPRESSOR) tablet 12.5 mg  12.5 mg Oral BID Alessandra Bevels, MD   12.5 mg at 04/02/18 1024  . mirtazapine (REMERON) tablet 15 mg  15 mg Oral QHS Alessandra Bevels, MD   15 mg at 04/01/18 2211  . mupirocin ointment (BACTROBAN) 2 % 1 application  1 application Nasal BID Alessandra Bevels, MD   Stopped at 04/02/18 1026  . oxyCODONE (Oxy IR/ROXICODONE) immediate release tablet 10 mg  10 mg Oral BID Alessandra Bevels, MD   10 mg at 04/02/18 1024  . polyethylene glycol (MIRALAX / GLYCOLAX) packet 17 g  17 g Oral Daily Alessandra Bevels, MD   17 g at 04/02/18 1023  . potassium chloride SA (K-DUR,KLOR-CON) CR tablet 10 mEq  10 mEq Oral Daily Alessandra Bevels, MD   10 mEq at 04/02/18 1024  . psyllium (HYDROCIL/METAMUCIL) packet 1 packet  1 packet Oral Daily Alessandra Bevels, MD   1 packet at 04/02/18 1023  . senna-docusate (Senokot-S) tablet 1 tablet  1 tablet Oral QHS Alessandra Bevels, MD   1 tablet at 04/01/18 2211  . sodium chloride (OCEAN) 0.65 % nasal spray 1 spray  1 spray Each Nare PRN Kamineni, Neelima, MD      . timolol (TIMOPTIC) 0.25 % ophthalmic solution 1 drop  1 drop Both Eyes BID Alessandra Bevels, MD   1 drop at 04/01/18 2212     Discharge Medications: Please see discharge summary for a list of discharge medications.  Relevant Imaging Results:  Relevant Lab Results:   Additional Information SS# [id number]; pt returning with [facility] services  [name], LCSWA

## 2018-04-02 NOTE — Social Work (Addendum)
Clinical Social Worker facilitated patient discharge including contacting patient family and facility to confirm patient discharge plans.  Clinical information faxed to facility and family agreeable with plan.  CSW arranged ambulance transport via [facility]  to [facility]F. RN to call [phone] with report prior to discharge.  Clinical Social Worker will sign off for now as social work intervention is no longer needed. Please consult [place] again if new need arises.  [name], LCSWA Clinical Social Worker

## 2018-04-02 NOTE — Discharge Summary (Addendum)
Physician Discharge Summary  Yvette Carroll ZOX:[id number] DOB: 04-Mar-1920 DOA: 03/31/2018  PCP: Pearson Grippe, MD  Admit date: 03/31/2018 Discharge date: 04/02/2018  Admitted From: home Disposition:  Memory care with Hospice  Recommendations for Outpatient Follow-up:  1. Follow up with PCP in 1-2 weeks 2. Please obtain BMP/CBC in one week   Home Health:No NoneEquipment/Devices:  Discharge Condition:guarded CODE STATUS:DNR Diet recommendation:  Regular   Brief/Interim Summary: 84 year old with past medical history of CVA, dysphagia hypertension spinal stenosis bilateral hip replacement comes in after falling at assisted living facility.  She is wheelchair-bound but able to transfer independently, she is complaining of pain 10 out of 10 in the sacral area denies any dizziness chest pain or palpitation.  Daughter was at bedside on admission have been contemplating higher level of care due to her needs.  She requests a skilled nursing facility placement.  Discharge Diagnoses:  Active Problems:   Fall   Palliative care by specialist   Goals of care, counseling/discussion   Chronic back pain  Mechanical fall: CT head and spine showed no acute findings x-ray of the pelvic showed an old right superior and inferior pubic ramus but no new injuries. Her pain was controlled. Social worker was consulted and her daughter agreed for the patient to be placed to skilled nursing facility. Continue narcotics for pain.  Coronary artery disease: No change made to her medication.  GERD: Excellent continue PPI.  History of CVA: Continue Plavix and statin.  Glaucoma: No changes were made to her medication.  Discharge Instructions  Discharge Instructions    Diet - low sodium heart healthy   Complete by:  As directed    Increase activity slowly   Complete by:  As directed      Allergies as of 04/02/2018      Reactions   Betadine [povidone Iodine] Itching, Rash   Reaction is SEVERE    Lisinopril Anaphylaxis, Swelling   Throat swells   Shellfish Allergy Anaphylaxis   Macrolides And Ketolides Other (See Comments)   Per MAR   Penicillins Swelling   Site of swelling?? Has patient had a PCN reaction causing immediate rash, facial/tongue/throat swelling, SOB or lightheadedness with hypotension: Yes Has patient had a PCN reaction causing severe rash involving mucus membranes or skin necrosis: Unk Has patient had a PCN reaction that required hospitalization: Unk Has patient had a PCN reaction occurring within the last 10 years: Unk If all of the above answers are "NO", then may proceed with Cephalosporin use.   Risperdal [risperidone] Other (See Comments)   Per MAR   Iodine Rash      Medication List    STOP taking these medications   acetaminophen 500 MG tablet Commonly known as:  TYLENOL     TAKE these medications   ALPRAZolam 0.25 MG tablet Commonly known as:  XANAX Take 0.5 tablets (0.125 mg total) by mouth at bedtime.   antiseptic oral rinse Liqd 15 mLs by Mouth Rinse route 3 (three) times daily. May self administer   atorvastatin 80 MG tablet Commonly known as:  LIPITOR Take 1 tablet (80 mg total) by mouth daily at 6 PM.   bimatoprost 0.01 % Soln Commonly known as:  LUMIGAN Place 1 drop into both eyes every evening.   BIOFREEZE 4 % Gel Generic drug:  Menthol (Topical Analgesic) Apply 1 application topically 2 (two) times daily. Both knees   calcium citrate 950 MG tablet Commonly known as:  CALCITRATE - dosed in mg elemental calcium Take  200 mg of elemental calcium by mouth daily.   cholecalciferol 1000 units tablet Commonly known as:  VITAMIN D Take 1,000 Units by mouth daily.   clopidogrel 75 MG tablet Commonly known as:  PLAVIX Take 1 tablet (75 mg total) by mouth daily.   DULoxetine 30 MG capsule Commonly known as:  CYMBALTA Take 30 mg by mouth daily.   ENSURE PLUS Liqd Take 237 mLs by mouth daily.   fluticasone 50 MCG/ACT nasal  spray Commonly known as:  FLONASE Place 1 spray into both nostrils 2 (two) times daily.   furosemide 20 MG tablet Commonly known as:  LASIX Take 20 mg by mouth daily.   hydrocortisone cream 0.5 % Apply 1 application topically as needed for itching.   lidocaine 5 % Commonly known as:  LIDODERM Place 1 patch onto the skin daily as needed (for knee, hip and back pain). Remove & Discard patch within 12 hours or as directed by MD   loratadine 10 MG tablet Commonly known as:  CLARITIN Take 10 mg by mouth daily as needed (for runny nose or sinus symptoms).   loteprednol 0.5 % ophthalmic suspension Commonly known as:  LOTEMAX Place 1 drop into both eyes 2 (two) times daily.   metoprolol tartrate 25 MG tablet Commonly known as:  LOPRESSOR Take 0.5 tablets (12.5 mg total) by mouth 2 (two) times daily.   mirtazapine 15 MG tablet Commonly known as:  REMERON Take 15 mg by mouth at bedtime.   morphine 20 MG/5ML solution Take 1.3 mLs (5.2 mg total) by mouth every 4 (four) hours as needed for pain. What changed:  how much to take   Oxycodone HCl 10 MG Tabs Take 1 tablet (10 mg total) by mouth 2 (two) times daily.   oxycodone 5 MG capsule Commonly known as:  OXY-IR Take 1 capsule (5 mg total) by mouth 2 (two) times daily as needed for pain.   polyethylene glycol packet Commonly known as:  MIRALAX / GLYCOLAX Take 17 g by mouth daily.   potassium chloride 10 MEQ tablet Commonly known as:  K-DUR,KLOR-CON Take 10 mEq by mouth daily. With Lasix   psyllium 58.6 % powder Commonly known as:  METAMUCIL Take 1 packet by mouth daily. 3.4 grams   ranitidine 150 MG tablet Commonly known as:  ZANTAC Take 150 mg by mouth 2 (two) times daily.   SENEXON-S 8.6-50 MG tablet Generic drug:  senna-docusate Take 1 tablet by mouth at bedtime.   sodium chloride 0.65 % Soln nasal spray Commonly known as:  OCEAN Place 1 spray into both nostrils as needed for congestion.   SYSTANE 0.4-0.3 %  Soln Generic drug:  Polyethyl Glycol-Propyl Glycol Place 1 drop into both eyes 4 (four) times daily as needed (for dry eyes).   timolol 0.25 % ophthalmic solution Commonly known as:  BETIMOL Place 2 drops into both eyes 2 (two) times daily.       Allergies  Allergen Reactions  . Betadine [Povidone Iodine] Itching and Rash    Reaction is SEVERE  . Lisinopril Anaphylaxis and Swelling    Throat swells  . Shellfish Allergy Anaphylaxis  . Macrolides And Ketolides Other (See Comments)    Per MAR  . Penicillins Swelling    Site of swelling?? Has patient had a PCN reaction causing immediate rash, facial/tongue/throat swelling, SOB or lightheadedness with hypotension: Yes Has patient had a PCN reaction causing severe rash involving mucus membranes or skin necrosis: Unk Has patient had a PCN reaction that  required hospitalization: Unk Has patient had a PCN reaction occurring within the last 10 years: Unk If all of the above answers are "NO", then may proceed with Cephalosporin use.   Marland Kitchen Risperdal [Risperidone] Other (See Comments)    Per MAR  . Iodine Rash    Consultations: None  Procedures/Studies: Dg Chest 1 View  Result Date: 03/31/2018 CLINICAL DATA:  RIGHT hip pain post fall EXAM: CHEST  1 VIEW COMPARISON:  08/15/2016 FINDINGS: Upper normal heart size. Mediastinal contours and pulmonary vascularity normal. Atherosclerotic calcification aorta. Chronic peribronchial thickening. No infiltrate, pleural effusion or pneumothorax. Bones demineralized with note of a chronic RIGHT rotator cuff tear, LEFT glenohumeral degenerative changes, and prior cervical spine fusion. IMPRESSION: Chronic bronchitic changes without infiltrate. Electronically Signed   By: Ulyses Southward M.D.   On: 03/31/2018 11:49   Ct Head Wo Contrast  Result Date: 03/31/2018 CLINICAL DATA:  84 year old female found down this morning.  Pain. EXAM: CT HEAD WITHOUT CONTRAST TECHNIQUE: Contiguous axial images were obtained  from the base of the skull through the vertex without intravenous contrast. COMPARISON:  Head CT and brain MRI 09/24/2017. FINDINGS: Brain: Stable cerebral volume. Stable gray-white matter differentiation throughout the brain. No midline shift, ventriculomegaly, mass effect, evidence of mass lesion, intracranial hemorrhage or evidence of cortically based acute infarction. Vascular: Calcified atherosclerosis at the skull base. No suspicious intracranial vascular hyperdensity. Skull: Stable and intact. Sinuses/Orbits: Visualized paranasal sinuses and mastoids are stable and well pneumatized aside from mildly increased chronic fluid and/or bubbly opacity in the sphenoid sinuses. Other: No scalp hematoma identified.  Stable orbits soft tissues. IMPRESSION: Stable. No acute intracranial abnormality or acute traumatic injury identified. Electronically Signed   By: Odessa Fleming M.D.   On: 03/31/2018 12:56   Ct Cervical Spine Wo Contrast  Result Date: 03/31/2018 CLINICAL DATA:  84 year old female found down this morning.  Pain. EXAM: CT CERVICAL SPINE WITHOUT CONTRAST TECHNIQUE: Multidetector CT imaging of the cervical spine was performed without intravenous contrast. Multiplanar CT image reconstructions were also generated. COMPARISON:  Head CT today reported separately. Brain MRI 09/24/2017. FINDINGS: Alignment: Cervicothoracic junction alignment is within normal limits. Bilateral posterior element alignment is within normal limits. Mild degenerative appearing anterolisthesis of C4 on C5 and C3 on C4 appears stable since March. Skull base and vertebrae: Osteopenia. Visualized skull base is intact. No atlanto-occipital dissociation. No cervical spine fracture identified. Soft tissues and spinal canal: No prevertebral fluid or swelling. No visible canal hematoma. Negative noncontrast neck soft tissues aside from calcified carotid atherosclerosis and a partially retropharyngeal course of both carotids. Disc levels: Prior  C6-C7 ACDF with hardware in place and solid arthrodesis. Probable developing interbody ankylosis at C5-C6. Similar severe disc space loss at C7-T1 where developing ankylosis is also possible. Upper cervical facet arthropathy in the setting of mild spondylolisthesis. No cervical spinal stenosis suspected. Upper chest: Visible upper thoracic levels appear intact. Advanced T1-T2 disc and endplate degeneration. Negative lung apices aside from scarring. Negative noncontrast thoracic inlet aside from postoperative changes. Other: Head CT today reported separately. IMPRESSION: 1.  No acute traumatic injury identified in the cervical spine. 2. Prior C6-C7 ACDF with solid arthrodesis. Possible developing ankylosis at both C5-C6 and C7-T1. Electronically Signed   By: Odessa Fleming M.D.   On: 03/31/2018 13:01   Dg Hip Unilat With Pelvis 2-3 Views Right  Result Date: 03/31/2018 CLINICAL DATA:  RIGHT hip pain post fall EXAM: DG HIP (WITH OR WITHOUT PELVIS) 2-3V RIGHT COMPARISON:  08/12/2016 FINDINGS: Diffuse osseous  demineralization. IM nail with compression screw at proximal RIGHT femur unchanged. Plate with compression screw proximal LEFT femur again identified with mild lucency surrounding the screw. Deformities of the RIGHT superior inferior pubic rami post old fractures. No definite acute fracture, dislocation or bone destruction. Scattered atherosclerotic calcifications. IMPRESSION: Prior BILATERAL proximal femoral ORIF with lucency seen surrounding the screw at the LEFT femoral neck, cannot exclude loosening or infection with this appearance. Osseous demineralization without acute bony abnormalities. Old RIGHT superior and inferior pubic rami fractures. Electronically Signed   By: Ulyses Southward M.D.   On: 03/31/2018 11:48   (Echo, Carotid, EGD, Colonoscopy, ERCP)    Subjective:   Discharge Exam: Vitals:   04/01/18 2033 04/02/18 0348  BP: (!) 148/56 (!) 148/45  Pulse: 60 78  Resp: 16 16  Temp: 98.4 F (36.9 C)  (!) 97.5 F (36.4 C)  SpO2: 97% 100%   Vitals:   04/01/18 0434 04/01/18 1300 04/01/18 2033 04/02/18 0348  BP: (!) 174/56 (!) 155/57 (!) 148/56 (!) 148/45  Pulse: 80 71 60 78  Resp: 16 16 16 16   Temp: 98.2 F (36.8 C) 98 F (36.7 C) 98.4 F (36.9 C) (!) 97.5 F (36.4 C)  TempSrc: Oral Oral Oral Oral  SpO2: 96% 98% 97% 100%    General: Pt is alert, awake, not in acute distress Cardiovascular: RRR, S1/S2 +, no rubs, no gallops Respiratory: CTA bilaterally, no wheezing, no rhonchi Abdominal: Soft, NT, ND, bowel sounds + Extremities: no edema, no cyanosis    The results of significant diagnostics from this hospitalization (including imaging, microbiology, ancillary and laboratory) are listed below for reference.     Microbiology: Recent Results (from the past 240 hour(s))  MRSA PCR Screening     Status: Abnormal   Collection Time: 03/31/18  8:30 PM  Result Value Ref Range Status   MRSA by PCR POSITIVE (A) NEGATIVE Final    Comment:        The GeneXpert MRSA Assay (FDA approved for NASAL specimens only), is one component of a comprehensive MRSA colonization surveillance program. It is not intended to diagnose MRSA infection nor to guide or monitor treatment for MRSA infections. RESULT CALLED TO, READ BACK BY AND VERIFIED WITH: Gillian Shields RN 03/31/18 2323 JDW Performed at [facility], [address], [place], [place] [address]      Labs: BNP (last 3 results) No results for input(s): BNP in the last 8760 hours. Basic Metabolic Panel: Recent Labs  Lab 03/31/18 1020  NA 139  K 4.2  CL 99  CO2 28  GLUCOSE 106*  BUN 14  CREATININE 0.88  CALCIUM 9.3   Liver Function Tests: No results for input(s): AST, ALT, ALKPHOS, BILITOT, PROT, ALBUMIN in the last 168 hours. No results for input(s): LIPASE, AMYLASE in the last 168 hours. No results for input(s): AMMONIA in the last 168 hours. CBC: Recent Labs  Lab 03/31/18 1020  WBC 6.1  NEUTROABS 4.2  HGB 12.0   HCT 38.9  MCV 94.4  PLT 262   Cardiac Enzymes: No results for input(s): CKTOTAL, CKMB, CKMBINDEX, TROPONINI in the last 168 hours. BNP: Invalid input(s): POCBNP CBG: No results for input(s): GLUCAP in the last 168 hours. D-Dimer No results for input(s): DDIMER in the last 72 hours. Hgb A1c No results for input(s): HGBA1C in the last 72 hours. Lipid Profile No results for input(s): CHOL, HDL, LDLCALC, TRIG, CHOLHDL, LDLDIRECT in the last 72 hours. Thyroid function studies No results for input(s):  TSH, T4TOTAL, T3FREE, THYROIDAB in the last 72 hours.  Invalid input(s): FREET3 Anemia work up No results for input(s): VITAMINB12, FOLATE, FERRITIN, TIBC, IRON, RETICCTPCT in the last 72 hours. Urinalysis    Component Value Date/Time   COLORURINE STRAW (A) 03/31/2018 1426   APPEARANCEUR CLEAR 03/31/2018 1426   LABSPEC 1.008 03/31/2018 1426   PHURINE 8.0 03/31/2018 1426   GLUCOSEU NEGATIVE 03/31/2018 1426   HGBUR NEGATIVE 03/31/2018 1426   BILIRUBINUR NEGATIVE 03/31/2018 1426   KETONESUR NEGATIVE 03/31/2018 1426   PROTEINUR NEGATIVE 03/31/2018 1426   UROBILINOGEN 1.0 03/14/2014 1543   NITRITE NEGATIVE 03/31/2018 1426   LEUKOCYTESUR NEGATIVE 03/31/2018 1426   Sepsis Labs Invalid input(s): PROCALCITONIN,  WBC,  LACTICIDVEN Microbiology Recent Results (from the past 240 hour(s))  MRSA PCR Screening     Status: Abnormal   Collection Time: 03/31/18  8:30 PM  Result Value Ref Range Status   MRSA by PCR POSITIVE (A) NEGATIVE Final    Comment:        The GeneXpert MRSA Assay (FDA approved for NASAL specimens only), is one component of a comprehensive MRSA colonization surveillance program. It is not intended to diagnose MRSA infection nor to guide or monitor treatment for MRSA infections. RESULT CALLED TO, READ BACK BY AND VERIFIED WITH: Gillian Shields RN 03/31/18 2323 JDW Performed at [facility], [address], [place], [place] [address]      Time coordinating  discharge: Over 30 minutes  SIGNED:   Marinda Elk, MD  [facility] 04/02/2018, 10:13 AM Pager   If 7PM-7AM, please contact night-coverage [email or web address] Password [email or web address]

## 2019-03-02 ENCOUNTER — Encounter (HOSPITAL_COMMUNITY): Payer: Self-pay | Admitting: *Deleted

## 2019-03-02 ENCOUNTER — Emergency Department (HOSPITAL_COMMUNITY)

## 2019-03-02 ENCOUNTER — Emergency Department (HOSPITAL_COMMUNITY)
Admission: EM | Admit: 2019-03-02 | Discharge: 2019-03-02 | Disposition: A | Discharge status: Home / Self Care | Attending: Emergency Medicine | Admitting: Emergency Medicine

## 2019-03-02 ENCOUNTER — Other Ambulatory Visit: Payer: Self-pay

## 2019-03-02 DIAGNOSIS — Y9289 Other specified places as the place of occurrence of the external cause: Secondary | ICD-10-CM | POA: Diagnosis not present

## 2019-03-02 DIAGNOSIS — I6782 Cerebral ischemia: Secondary | ICD-10-CM | POA: Diagnosis not present

## 2019-03-02 DIAGNOSIS — I10 Essential (primary) hypertension: Secondary | ICD-10-CM | POA: Diagnosis not present

## 2019-03-02 DIAGNOSIS — S7002XA Contusion of left hip, initial encounter: Secondary | ICD-10-CM | POA: Diagnosis not present

## 2019-03-02 DIAGNOSIS — Y999 Unspecified external cause status: Secondary | ICD-10-CM | POA: Insufficient documentation

## 2019-03-02 DIAGNOSIS — Z79899 Other long term (current) drug therapy: Secondary | ICD-10-CM | POA: Diagnosis not present

## 2019-03-02 DIAGNOSIS — Y9389 Activity, other specified: Secondary | ICD-10-CM | POA: Insufficient documentation

## 2019-03-02 DIAGNOSIS — W1812XA Fall from or off toilet with subsequent striking against object, initial encounter: Secondary | ICD-10-CM | POA: Insufficient documentation

## 2019-03-02 DIAGNOSIS — I2510 Atherosclerotic heart disease of native coronary artery without angina pectoris: Secondary | ICD-10-CM | POA: Diagnosis not present

## 2019-03-02 DIAGNOSIS — W19XXXA Unspecified fall, initial encounter: Secondary | ICD-10-CM

## 2019-03-02 DIAGNOSIS — S79812A Other specified injuries of left hip, initial encounter: Secondary | ICD-10-CM | POA: Diagnosis present

## 2019-03-02 LAB — URINALYSIS, ROUTINE W REFLEX MICROSCOPIC
Bilirubin Urine: NEGATIVE
Glucose, UA: NEGATIVE mg/dL
Hgb urine dipstick: NEGATIVE
Ketones, ur: NEGATIVE mg/dL
Leukocytes,Ua: NEGATIVE
Nitrite: NEGATIVE
Protein, ur: NEGATIVE mg/dL
Specific Gravity, Urine: 1.005 (ref 1.005–1.030)
pH: 9 — ABNORMAL HIGH (ref 5.0–8.0)

## 2019-03-02 MED ORDER — OXYCODONE HCL 5 MG PO TABS
5.0000 mg | ORAL_TABLET | Freq: Once | ORAL | Status: AC
  Administered 2019-03-02: 5 mg via ORAL
  Filled 2019-03-02: qty 1

## 2019-03-02 NOTE — ED Notes (Signed)
Someone from [facility] called and I gave them report and notified them pt is coming back

## 2019-03-02 NOTE — ED Notes (Addendum)
Pt is NSR w/PVCs on monitor 

## 2019-03-02 NOTE — ED Provider Notes (Signed)
[facility]   CSN: [id number] Arrival date & time: 03/02/19  [id number]     History   Chief Complaint Chief Complaint  Patient presents with   Fall    HPI Yvette Carroll is a 85 y.o. female.     The history is provided by the patient, the EMS personnel and medical records. No language interpreter was used.   Yvette Carroll is a 85 y.o. female who presents to the Emergency Department complaining of fall.  [facility].  She was transferring from the commode to the wheelchair when she fell, missing the wheelchair and landed on her left side.  She complains of pain to her left hip.  She also complains of neck stiffness - worsening of chronic pain. She is unsure if she hit her head.  She denies any recent illnesses.  She does have mild dysuria.   Past Medical History:  Diagnosis Date   Depression    GERD (gastroesophageal reflux disease)    Glaucoma    Hypertension    Lumbar spinal stenosis    Osteoporosis    Right knee DJD    Vertigo     Patient Active Problem List   Diagnosis Date Noted   Palliative care by specialist    Goals of care, counseling/discussion    Chronic back pain    Fall 03/31/2018   Acute CVA (cerebrovascular accident) (HCC) 09/25/2017   Benign essential HTN 09/25/2017   Depression with anxiety 09/24/2017   Acute posthemorrhagic anemia 05/27/2013   Chronic low back pain 04/30/2013   Glaucoma 04/30/2013   Allergic rhinitis 04/30/2013   GERD (gastroesophageal reflux disease) 04/30/2013   CAD (coronary artery disease) 04/30/2013   Constipation 04/30/2013   Hip fracture (HCC) 04/24/2013   Anemia 04/24/2013    Past Surgical History:  Procedure Laterality Date   ABDOMINAL HYSTERECTOMY     EYE SURGERY     FRACTURE SURGERY     HIP FRACTURE SURGERY Left    INTRAMEDULLARY (IM) NAIL INTERTROCHANTERIC Right 04/24/2013    Procedure: INTRAMEDULLARY (IM) NAIL INTERTROCHANTRIC;  Surgeon: Nadara MustardMarcus V Duda, MD;  Location: [facility] OR;  Service: Orthopedics;  Laterality: Right;   NECK SURGERY       OB History   No obstetric history on file.      Home Medications    Prior to Admission medications   Medication Sig Start Date End Date Taking? Authorizing Provider  acetaminophen (TYLENOL) 500 MG tablet Take 500 mg by mouth every 8 (eight) hours as needed for mild pain.   Yes [provider]  ALPRAZolam (XANAX) 0.25 MG tablet Take 0.5 tablets (0.125 mg total) by mouth at bedtime. 04/02/18  Yes Marinda ElkFeliz Ortiz, Abraham, MD  antiseptic oral rinse (BIOTENE) LIQD 15 mLs by Mouth Rinse route 3 (three) times daily. May self administer   Yes [provider]  bimatoprost (LUMIGAN) 0.01 % SOLN Place 1 drop into both eyes every evening.    Yes [provider]  citalopram (CELEXA) 10 MG tablet Take 10 mg by mouth daily.   Yes [provider]  clopidogrel (PLAVIX) 75 MG tablet Take 1 tablet (75 mg total) by mouth daily. 09/27/17  Yes Standley BrookingGoodrich, Daniel P, MD  diphenhydrAMINE-zinc acetate (BENADRYL) cream Apply 1 application topically daily as needed for itching (chin).   Yes [provider]  Docosanol (ABREVA) 10 % CREA Apply 1 application topically 4 (  four) times daily as needed (fever blisters).   Yes [provider]  ENSURE PLUS (ENSURE PLUS) LIQD Take 237 mLs by mouth daily.    Yes [provider]  fluticasone (FLONASE) 50 MCG/ACT nasal spray Place 1 spray into both nostrils 2 (two) times daily.    Yes [provider]  furosemide (LASIX) 20 MG tablet Take 20 mg by mouth daily.   Yes [provider]  lidocaine (LIDODERM) 5 % Place 1 patch onto the skin daily as needed (for knee, hip and back pain). Remove & Discard patch within 12 hours or as directed by MD   Yes [provider]  loratadine (CLARITIN) 10 MG tablet Take 10 mg by mouth daily.    Yes  [provider]  Menthol-Methyl Salicylate (MUSCLE RUB) 10-15 % CREA Apply 1 application topically See admin instructions. Apply twice daily to both knees. May use as needed for pain.   Yes [provider]  mirtazapine (REMERON) 15 MG tablet Take 15 mg by mouth at bedtime.   Yes [provider]  oxycodone (OXY-IR) 5 MG capsule Take 1 capsule (5 mg total) by mouth 2 (two) times daily as needed for pain. 04/02/18  Yes Charlynne Cousins, MD  Oxycodone HCl 10 MG TABS Take 1 tablet (10 mg total) by mouth 2 (two) times daily. 04/02/18  Yes Charlynne Cousins, MD  phenylephrine-shark liver oil-mineral oil-petrolatum (PREPARATION H) 0.25-3-14-71.9 % rectal ointment Place 1 application rectally 2 (two) times daily as needed for hemorrhoids.   Yes [provider]  Polyethyl Glycol-Propyl Glycol (SYSTANE) 0.4-0.3 % SOLN Place 1 drop into both eyes 4 (four) times daily as needed (for dry eyes).    Yes [provider]  polyethylene glycol (MIRALAX / GLYCOLAX) packet Take 17 g by mouth daily.   Yes [provider]  potassium chloride 20 MEQ/15ML (10%) SOLN Take 10 mEq by mouth daily.   Yes [provider]  prednisoLONE acetate (PRED FORTE) 1 % ophthalmic suspension Place 1 drop into both eyes 2 (two) times daily.   Yes [provider]  psyllium (METAMUCIL) 58.6 % powder Take 1 packet by mouth daily. 3.4 grams   Yes [provider]  risperiDONE (RISPERDAL) 0.25 MG tablet Take 0.25 mg by mouth at bedtime.   Yes [provider]  risperiDONE (RISPERDAL) 1 MG/ML oral solution Take 0.25 mg by mouth 2 (two) times daily as needed (hallucinations or severe agitation).   Yes [provider]  senna-docusate (SENEXON-S) 8.6-50 MG tablet Take 1 tablet by mouth at bedtime.   Yes [provider]  sodium chloride (OCEAN) 0.65 % SOLN nasal spray Place 2 sprays into both nostrils 3 (three) times daily as needed for  congestion.    Yes [provider]  timolol (BETIMOL) 0.25 % ophthalmic solution Place 2 drops into both eyes 2 (two) times daily.    Yes [provider]  atorvastatin (LIPITOR) 80 MG tablet Take 1 tablet (80 mg total) by mouth daily at 6 PM. Patient not taking: Reported on 03/02/2019 09/26/17   Samuella Cota, MD  metoprolol tartrate (LOPRESSOR) 25 MG tablet Take 0.5 tablets (12.5 mg total) by mouth 2 (two) times daily. Patient not taking: Reported on 03/02/2019 04/02/18   Charlynne Cousins, MD  morphine 20 MG/5ML solution Take 1.3 mLs (5.2 mg total) by mouth every 4 (four) hours as needed for pain. Patient not taking: Reported on 03/02/2019 04/02/18   Charlynne Cousins, MD  Family History No family history on file.  Social History Social History   Tobacco Use   Smoking status: Never Smoker   Smokeless tobacco: Never Used  Substance Use Topics   Alcohol use: No   Drug use: No     Allergies   Betadine [povidone iodine], Lisinopril, Shellfish allergy, Macrolides and ketolides, Penicillins, Risperdal [risperidone], and Iodine   Review of Systems Review of Systems  All other systems reviewed and are negative.    Physical Exam Updated Vital Signs BP (!) 176/62 (BP Location: Right Arm)    Pulse 78    Temp 98 F (36.7 C) (Oral)    Resp (!) 26    Ht 5' (1.524 m)    Wt 58.9 kg    SpO2 96%    BMI 25.36 kg/m   Physical Exam Vitals signs and nursing note reviewed.  Constitutional:      Appearance: She is well-developed.  HENT:     Head: Normocephalic and atraumatic.  Cardiovascular:     Rate and Rhythm: Normal rate and regular rhythm.     Heart sounds: No murmur.  Pulmonary:     Effort: Pulmonary effort is normal. No respiratory distress.     Breath sounds: Normal breath sounds.  Abdominal:     Palpations: Abdomen is soft.     Tenderness: There is no abdominal tenderness. There is no guarding or rebound.  Musculoskeletal:        General: No  tenderness.     Comments: 2+ DP pulses bilaterally.  There is mild tenderness to palpation to bilateral hips.  There is atrophy of the RLE.  Skin:    General: Skin is warm and dry.  Neurological:     Mental Status: She is alert and oriented to person, place, and time.  Psychiatric:        Mood and Affect: Mood normal.        Behavior: Behavior normal.      ED Treatments / Results  Labs (all labs ordered are listed, but only abnormal results are displayed) Labs Reviewed  URINALYSIS, ROUTINE W REFLEX MICROSCOPIC - Abnormal; Notable for the following components:      Result Value   Color, Urine STRAW (*)    pH 9.0 (*)    All other components within normal limits  URINE CULTURE    EKG None  Radiology Ct Head Wo Contrast  Result Date: 03/02/2019 CLINICAL DATA:  Posttraumatic headache and neck pain after fall today. No loss of consciousness. EXAM: CT HEAD WITHOUT CONTRAST CT CERVICAL SPINE WITHOUT CONTRAST TECHNIQUE: Multidetector CT imaging of the head and cervical spine was performed following the standard protocol without intravenous contrast. Multiplanar CT image reconstructions of the cervical spine were also generated. COMPARISON:  CT scan of March 31, 2018. FINDINGS: CT HEAD FINDINGS Brain: Mild chronic ischemic white matter disease is noted. No mass effect or midline shift is noted. Ventricular size is within normal limits. There is no evidence of mass lesion, hemorrhage or acute infarction. Vascular: No hyperdense vessel or unexpected calcification. Skull: Normal. Negative for fracture or focal lesion. Sinuses/Orbits: No acute finding. Other: None. CT CERVICAL SPINE FINDINGS Alignment: Minimal grade 1 anterolisthesis of C3-4 and C4-5 is noted secondary to posterior facet joint hypertrophy. Skull base and vertebrae: No acute fracture. No primary bone lesion or focal pathologic process. Soft tissues and spinal canal: No prevertebral fluid or swelling. No visible canal hematoma.  Disc levels: Status post surgical anterior fusion of C6-7 is noted. Severe  degenerative disc disease is noted at C5-6, C7-T1 and T1-2. Upper chest: Negative. Other: Degenerative changes are seen involving posterior facet joints bilaterally. IMPRESSION: Mild chronic ischemic white matter disease. No acute intracranial abnormality seen. Multilevel postsurgical and degenerative changes are noted in the cervical spine. No fracture or other acute abnormality is noted. Electronically Signed   By: Lupita RaiderJames  Green Jr M.D.   On: 03/02/2019 08:44   Ct Cervical Spine Wo Contrast  Result Date: 03/02/2019 CLINICAL DATA:  Posttraumatic headache and neck pain after fall today. No loss of consciousness. EXAM: CT HEAD WITHOUT CONTRAST CT CERVICAL SPINE WITHOUT CONTRAST TECHNIQUE: Multidetector CT imaging of the head and cervical spine was performed following the standard protocol without intravenous contrast. Multiplanar CT image reconstructions of the cervical spine were also generated. COMPARISON:  CT scan of March 31, 2018. FINDINGS: CT HEAD FINDINGS Brain: Mild chronic ischemic white matter disease is noted. No mass effect or midline shift is noted. Ventricular size is within normal limits. There is no evidence of mass lesion, hemorrhage or acute infarction. Vascular: No hyperdense vessel or unexpected calcification. Skull: Normal. Negative for fracture or focal lesion. Sinuses/Orbits: No acute finding. Other: None. CT CERVICAL SPINE FINDINGS Alignment: Minimal grade 1 anterolisthesis of C3-4 and C4-5 is noted secondary to posterior facet joint hypertrophy. Skull base and vertebrae: No acute fracture. No primary bone lesion or focal pathologic process. Soft tissues and spinal canal: No prevertebral fluid or swelling. No visible canal hematoma. Disc levels: Status post surgical anterior fusion of C6-7 is noted. Severe degenerative disc disease is noted at C5-6, C7-T1 and T1-2. Upper chest: Negative. Other: Degenerative  changes are seen involving posterior facet joints bilaterally. IMPRESSION: Mild chronic ischemic white matter disease. No acute intracranial abnormality seen. Multilevel postsurgical and degenerative changes are noted in the cervical spine. No fracture or other acute abnormality is noted. Electronically Signed   By: Lupita RaiderJames  Green Jr M.D.   On: 03/02/2019 08:44   Dg Hips Bilat With Pelvis Min 5 Views  Result Date: 03/02/2019 CLINICAL DATA:  85 year old female status post fall from wheelchair with pain. EXAM: DG HIP (WITH OR WITHOUT PELVIS) 5+V BILAT COMPARISON:  Pelvis CT 04/22/2011.  Right hip series 03/31/2018. FINDINGS: Chronic bilateral proximal femur ORIF hardware. Underlying osteopenia. Compared to 2019, the hardware appears stable, including lucency along the proximal left femur dynamic hip screw. Femoral heads remain normally located. Both proximal femurs appear intact. Right superior and inferior pubic rami fractures are chronic and appear stable. No acute osseous abnormality identified. Negative lower abdominal and pelvic visceral contours. Bilateral calcified femoral artery atherosclerosis. IMPRESSION: Osteopenia. Stable hardware and no acute osseous abnormality identified. Electronically Signed   By: Odessa FlemingH  Hall M.D.   On: 03/02/2019 07:44    Procedures Procedures (including critical care time)  Medications Ordered in ED Medications  oxyCODONE (Oxy IR/ROXICODONE) immediate release tablet 5 mg (5 mg Oral Given 03/02/19 [id number])     Initial Impression / Assessment and Plan / ED Course  I have reviewed the triage vital signs and the nursing notes.  Pertinent labs & imaging results that were available during my care of the patient were reviewed by me and considered in my medical decision making (see chart for details).        Patient presents from a morning few for evaluation of injuries following a fall. She complained of left hip pain but did have some right hip tenderness on examination.  Imaging is negative for acute fracture. She is able to move  both hips. She is nonweightbearing at baseline and uses a wheelchair. She is currently in hospice care. Plan to discharge back to facility for ongoing treatment. Discussed with patient and daughter findings of studies and they are in agreement with treatment plan.  Final Clinical Impressions(s) / ED Diagnoses   Final diagnoses:  Fall, initial encounter  Contusion of left hip, initial encounter    ED Discharge Orders    None       Tilden Fossaees, Toby Ayad, MD 03/02/19 1250

## 2019-03-02 NOTE — ED Notes (Signed)
[facility] called @ 0951-per Alana, RN called by Levada Dy

## 2019-03-02 NOTE — ED Notes (Signed)
Patient transported to CT 

## 2019-03-02 NOTE — ED Triage Notes (Signed)
Patient presents to ED via [facility] states she was going to the bathroom and missed the seat of her wheelchair and landed on her left hip, states  Her neck is always stiff however it is hurting a little more today. Patient denies loc alert oriented.

## 2019-03-03 LAB — URINE CULTURE: Culture: <10000 — AB

## 2019-04-19 ENCOUNTER — Emergency Department (HOSPITAL_COMMUNITY): Admitting: Registered Nurse

## 2019-04-19 ENCOUNTER — Encounter (HOSPITAL_COMMUNITY): Payer: Self-pay

## 2019-04-19 ENCOUNTER — Encounter (HOSPITAL_COMMUNITY)
Admission: EM | Disposition: A | Discharge status: Home / Self Care | Payer: Self-pay | Source: Home / Self Care | Attending: Emergency Medicine

## 2019-04-19 ENCOUNTER — Other Ambulatory Visit: Payer: Self-pay

## 2019-04-19 ENCOUNTER — Emergency Department (HOSPITAL_COMMUNITY)
Admission: EM | Admit: 2019-04-19 | Discharge: 2019-04-20 | Disposition: A | Discharge status: Home / Self Care | Attending: Emergency Medicine | Admitting: Emergency Medicine

## 2019-04-19 DIAGNOSIS — T18128A Food in esophagus causing other injury, initial encounter: Secondary | ICD-10-CM | POA: Diagnosis not present

## 2019-04-19 DIAGNOSIS — Z20828 Contact with and (suspected) exposure to other viral communicable diseases: Secondary | ICD-10-CM | POA: Insufficient documentation

## 2019-04-19 DIAGNOSIS — K2940 Chronic atrophic gastritis without bleeding: Secondary | ICD-10-CM | POA: Insufficient documentation

## 2019-04-19 DIAGNOSIS — I10 Essential (primary) hypertension: Secondary | ICD-10-CM | POA: Insufficient documentation

## 2019-04-19 DIAGNOSIS — Y9389 Activity, other specified: Secondary | ICD-10-CM | POA: Insufficient documentation

## 2019-04-19 DIAGNOSIS — K449 Diaphragmatic hernia without obstruction or gangrene: Secondary | ICD-10-CM | POA: Insufficient documentation

## 2019-04-19 DIAGNOSIS — X58XXXA Exposure to other specified factors, initial encounter: Secondary | ICD-10-CM | POA: Insufficient documentation

## 2019-04-19 DIAGNOSIS — Y929 Unspecified place or not applicable: Secondary | ICD-10-CM | POA: Insufficient documentation

## 2019-04-19 DIAGNOSIS — Y999 Unspecified external cause status: Secondary | ICD-10-CM | POA: Insufficient documentation

## 2019-04-19 DIAGNOSIS — T189XXA Foreign body of alimentary tract, part unspecified, initial encounter: Secondary | ICD-10-CM | POA: Diagnosis present

## 2019-04-19 HISTORY — PX: FOREIGN BODY REMOVAL: SHX962

## 2019-04-19 HISTORY — PX: ESOPHAGOGASTRODUODENOSCOPY: SHX5428

## 2019-04-19 LAB — POCT I-STAT EG7
Acid-Base Excess: 3 mmol/L — ABNORMAL HIGH (ref 0.0–2.0)
Bicarbonate: 27.6 mmol/L (ref 20.0–28.0)
Calcium, Ion: 1.16 mmol/L (ref 1.15–1.40)
HCT: 38 % (ref 36.0–46.0)
Hemoglobin: 12.9 g/dL (ref 12.0–15.0)
O2 Saturation: 78 %
Potassium: 3.9 mmol/L (ref 3.5–5.1)
Sodium: 138 mmol/L (ref 135–145)
TCO2: 29 mmol/L (ref 22–32)
pCO2, Ven: 41.6 mmHg — ABNORMAL LOW (ref 44.0–60.0)
pH, Ven: 7.431 — ABNORMAL HIGH (ref 7.250–7.430)
pO2, Ven: 41 mmHg (ref 32.0–45.0)

## 2019-04-19 LAB — SARS CORONAVIRUS 2 BY RT PCR (HOSPITAL ORDER, PERFORMED IN ~~LOC~~ HOSPITAL LAB): SARS Coronavirus 2: NEGATIVE

## 2019-04-19 SURGERY — EGD (ESOPHAGOGASTRODUODENOSCOPY)
Anesthesia: General

## 2019-04-19 MED ORDER — FENTANYL CITRATE (PF) 100 MCG/2ML IJ SOLN
INTRAMUSCULAR | Status: AC
  Filled 2019-04-19: qty 2

## 2019-04-19 MED ORDER — LORAZEPAM 2 MG/ML IJ SOLN
0.5000 mg | Freq: Once | INTRAMUSCULAR | Status: AC
  Administered 2019-04-19: 0.5 mg via INTRAVENOUS
  Filled 2019-04-19: qty 1

## 2019-04-19 MED ORDER — PROPOFOL 10 MG/ML IV BOLUS
INTRAVENOUS | Status: DC | PRN
  Administered 2019-04-19: 20 mg via INTRAVENOUS

## 2019-04-19 MED ORDER — PHENYLEPHRINE HCL (PRESSORS) 10 MG/ML IV SOLN
INTRAVENOUS | Status: AC
  Filled 2019-04-19: qty 1

## 2019-04-19 MED ORDER — SUCCINYLCHOLINE CHLORIDE 20 MG/ML IJ SOLN
INTRAMUSCULAR | Status: DC | PRN
  Administered 2019-04-19: 80 mg via INTRAVENOUS

## 2019-04-19 MED ORDER — ETOMIDATE 2 MG/ML IV SOLN
INTRAVENOUS | Status: AC
  Filled 2019-04-19: qty 10

## 2019-04-19 MED ORDER — ONDANSETRON HCL 4 MG/2ML IJ SOLN
INTRAMUSCULAR | Status: AC
  Filled 2019-04-19: qty 2

## 2019-04-19 MED ORDER — MIDAZOLAM HCL 2 MG/2ML IJ SOLN
INTRAMUSCULAR | Status: AC
  Filled 2019-04-19: qty 2

## 2019-04-19 MED ORDER — ETOMIDATE 2 MG/ML IV SOLN
INTRAVENOUS | Status: DC | PRN
  Administered 2019-04-19: 12 mg via INTRAVENOUS

## 2019-04-19 MED ORDER — SUCCINYLCHOLINE CHLORIDE 200 MG/10ML IV SOSY
PREFILLED_SYRINGE | INTRAVENOUS | Status: AC
  Filled 2019-04-19: qty 10

## 2019-04-19 MED ORDER — LABETALOL HCL 5 MG/ML IV SOLN
INTRAVENOUS | Status: DC | PRN
  Administered 2019-04-19: 5 mg via INTRAVENOUS

## 2019-04-19 MED ORDER — SODIUM CHLORIDE 0.9 % IV SOLN: INTRAVENOUS | Status: DC

## 2019-04-19 MED ORDER — ONDANSETRON HCL 4 MG/2ML IJ SOLN
INTRAMUSCULAR | Status: DC | PRN
  Administered 2019-04-19: 4 mg via INTRAVENOUS

## 2019-04-19 MED ORDER — LIDOCAINE 2% (20 MG/ML) 5 ML SYRINGE
INTRAMUSCULAR | Status: AC
  Filled 2019-04-19: qty 5

## 2019-04-19 MED ORDER — PHENYLEPHRINE 40 MCG/ML (10ML) SYRINGE FOR IV PUSH (FOR BLOOD PRESSURE SUPPORT)
PREFILLED_SYRINGE | INTRAVENOUS | Status: AC
  Filled 2019-04-19: qty 10

## 2019-04-19 MED ORDER — NITROGLYCERIN 0.4 MG SL SUBL
0.4000 mg | SUBLINGUAL_TABLET | Freq: Once | SUBLINGUAL | Status: AC
  Administered 2019-04-19: 0.4 mg via SUBLINGUAL
  Filled 2019-04-19: qty 1

## 2019-04-19 MED ORDER — LACTATED RINGERS IV SOLN
INTRAVENOUS | Status: DC
  Administered 2019-04-19: 17:00:00 via INTRAVENOUS

## 2019-04-19 MED ORDER — FENTANYL CITRATE (PF) 100 MCG/2ML IJ SOLN
INTRAMUSCULAR | Status: DC | PRN
  Administered 2019-04-19: 50 ug via INTRAVENOUS
  Administered 2019-04-19: 25 ug via INTRAVENOUS

## 2019-04-19 MED ORDER — GLUCAGON HCL RDNA (DIAGNOSTIC) 1 MG IJ SOLR
1.0000 mg | Freq: Once | INTRAMUSCULAR | Status: AC
  Administered 2019-04-19: 1 mg via INTRAVENOUS
  Filled 2019-04-19: qty 1

## 2019-04-19 MED ORDER — HYDRALAZINE HCL 20 MG/ML IJ SOLN
5.0000 mg | Freq: Once | INTRAMUSCULAR | Status: AC
  Administered 2019-04-19: 5 mg via INTRAVENOUS

## 2019-04-19 MED ORDER — PROPOFOL 10 MG/ML IV BOLUS
INTRAVENOUS | Status: AC
  Filled 2019-04-19: qty 20

## 2019-04-19 MED ORDER — EPHEDRINE 5 MG/ML INJ
INTRAVENOUS | Status: AC
  Filled 2019-04-19: qty 10

## 2019-04-19 NOTE — Transfer of Care (Signed)
Immediate Anesthesia Transfer of Care Note  Patient: Yvette Carroll  Procedure(s) Performed: ESOPHAGOGASTRODUODENOSCOPY (EGD) (N/A ) FOREIGN BODY REMOVAL  Patient Location: PACU  Anesthesia Type:General  Level of Consciousness: awake, drowsy and patient cooperative  Airway & Oxygen Therapy: Patient Spontanous Breathing and Patient connected to face mask oxygen  Post-op Assessment: Report given to RN, Post -op Vital signs reviewed and stable and Patient moving all extremities X 4  Post vital signs: stable  Last Vitals:  Vitals Value Taken Time  BP    Temp    Pulse    Resp    SpO2      Last Pain:  Vitals:   04/19/19 2011  TempSrc: Oral  PainSc: 0-No pain         Complications: No apparent anesthesia complications

## 2019-04-19 NOTE — Consult Note (Signed)
Reason for Consult: Food impaction Referring Physician: ER physician  Yvette Carroll is an 85 y.o. female.  HPI: Patient seen and examined in her hospital computer chart reviewed and her case discussed with my partner Dr. Bosie Clos who discussed it with the ER physician and we talked to her daughter on the phone and she does get checked frequently and had been dilated in the past but has not had any procedures recently and is currently living in a hospice home and is supposed to be on pured diet  Past Medical History:  Diagnosis Date  . Depression   . GERD (gastroesophageal reflux disease)   . Glaucoma   . Hypertension   . Lumbar spinal stenosis   . Osteoporosis   . Right knee DJD   . Vertigo     Past Surgical History:  Procedure Laterality Date  . ABDOMINAL HYSTERECTOMY    . EYE SURGERY    . FRACTURE SURGERY    . HIP FRACTURE SURGERY Left   . INTRAMEDULLARY (IM) NAIL INTERTROCHANTERIC Right 04/24/2013   Procedure: INTRAMEDULLARY (IM) NAIL INTERTROCHANTRIC;  Surgeon: Nadara Mustard, MD;  Location: [facility] OR;  Service: Orthopedics;  Laterality: Right;  . NECK SURGERY      History reviewed. No pertinent family history.  Social History:  reports that she has never smoked. She has never used smokeless tobacco. She reports that she does not drink alcohol or use drugs.  Allergies:  Allergies  Allergen Reactions  . Betadine [Povidone Iodine] Itching and Rash    Reaction is SEVERE  . Lisinopril Anaphylaxis and Swelling    Throat swells  . Shellfish Allergy Anaphylaxis  . Macrolides And Ketolides Other (See Comments)    Per MAR  . Penicillins Swelling    Site of swelling?? Has patient had a PCN reaction causing immediate rash, facial/tongue/throat swelling, SOB or lightheadedness with hypotension: Yes Has patient had a PCN reaction causing severe rash involving mucus membranes or skin necrosis: Unk Has patient had a PCN reaction that required hospitalization: Unk Has patient had a PCN  reaction occurring within the last 10 years: Unk If all of the above answers are "NO", then may proceed with Cephalosporin use.   Marland Kitchen Risperdal [Risperidone] Other (See Comments)    Per [facility]  03/02/19 Per MAR, patient is currently taking this medication.   . Iodine Rash    Medications: I have reviewed the patient's current medications.  Results for orders placed or performed during the hospital encounter of 04/19/19 (from the past 48 hour(s))  SARS Coronavirus 2 by RT PCR (hospital order, performed in [facility] hospital lab) Nasopharyngeal Nasopharyngeal Swab     Status: None   Collection Time: 04/19/19  6:01 PM   Specimen: Nasopharyngeal Swab  Result Value Ref Range   SARS Coronavirus 2 NEGATIVE NEGATIVE    Comment: (NOTE) If result is NEGATIVE SARS-CoV-2 target nucleic acids are NOT DETECTED. The SARS-CoV-2 RNA is generally detectable in upper and lower  respiratory specimens during the acute phase of infection. The lowest  concentration of SARS-CoV-2 viral copies this assay can detect is 250  copies / mL. A negative result does not preclude SARS-CoV-2 infection  and should not be used as the sole basis for treatment or other  patient management decisions.  A negative result may occur with  improper specimen collection / handling, submission of specimen other  than nasopharyngeal swab, presence of viral mutation(s) within the  areas targeted by this assay, and inadequate number of viral copies  (<  250 copies / mL). A negative result must be combined with clinical  observations, patient history, and epidemiological information. If result is POSITIVE SARS-CoV-2 target nucleic acids are DETECTED. The SARS-CoV-2 RNA is generally detectable in upper and lower  respiratory specimens dur ing the acute phase of infection.  Positive  results are indicative of active infection with SARS-CoV-2.  Clinical  correlation with patient history and other diagnostic information is  necessary to  determine patient infection status.  Positive results do  not rule out bacterial infection or co-infection with other viruses. If result is PRESUMPTIVE POSTIVE SARS-CoV-2 nucleic acids MAY BE PRESENT.   A presumptive positive result was obtained on the submitted specimen  and confirmed on repeat testing.  While 2019 novel coronavirus  (SARS-CoV-2) nucleic acids may be present in the submitted sample  additional confirmatory testing may be necessary for epidemiological  and / or clinical management purposes  to differentiate between  SARS-CoV-2 and other Sarbecovirus currently known to infect humans.  If clinically indicated additional testing with an alternate test  methodology [phone]) is advised. The SARS-CoV-2 RNA is generally  detectable in upper and lower respiratory sp ecimens during the acute  phase of infection. The expected result is Negative. Fact Sheet for Patients:  StrictlyIdeas.no Fact Sheet for Healthcare Providers: BankingDealers.co.za This test is not yet approved or cleared by the [place] FDA and has been authorized for detection and/or diagnosis of SARS-CoV-2 by FDA under an Emergency Use Authorization (EUA).  This EUA will remain in effect (meaning this test can be used) for the duration of the COVID-19 declaration under Section 564(b)(1) of the Act, 21 U.S.C. section 360bbb-3(b)(1), unless the authorization is terminated or revoked sooner. Performed at [facility], [place] [address] [place], [place] [address]     No results found.  ROS negative except above Blood pressure (!) 236/89, pulse 68, temperature 98.3 F (36.8 C), temperature source Oral, resp. rate 20, height 5' (1.524 m), weight 55.3 kg, SpO2 97 %. Physical Exam vital signs stable afebrile no acute distress elderly pleasant not completely oriented exam please see preassessment evaluation Assessment/Plan: Food impaction in  patient with chronic dysphasia Plan: The risks benefits methods including anesthesia was discussed with the daughter and she has agreed to let us proceed  [facility] E 04/19/2019, 8:24 PM

## 2019-04-19 NOTE — Anesthesia Preprocedure Evaluation (Addendum)
Anesthesia Evaluation  Patient identified by MRN, date of birth, ID band Patient awake    Reviewed: Allergy & Precautions, H&P , NPO status , Patient's Chart, lab work & pertinent test results  Airway Mallampati: II  TM Distance: >3 FB Neck ROM: Full    Dental no notable dental hx. (+) Teeth Intact, Dental Advisory Given   Pulmonary neg pulmonary ROS,    Pulmonary exam normal breath sounds clear to auscultation       Cardiovascular hypertension, Pt. on medications + Valvular Problems/Murmurs AS  Rhythm:Regular Rate:Normal + Systolic murmurs    Neuro/Psych Anxiety Depression Dementia CVA    GI/Hepatic Neg liver ROS, GERD  Medicated,  Endo/Other  negative endocrine ROS  Renal/GU negative Renal ROS  negative genitourinary   Musculoskeletal  (+) Arthritis , Osteoarthritis,    Abdominal   Peds  Hematology  (+) Blood dyscrasia, anemia ,   Anesthesia Other Findings   Reproductive/Obstetrics negative OB ROS                            Anesthesia Physical Anesthesia Plan  ASA: IV and emergent  Anesthesia Plan: General   Post-op Pain Management:    Induction: Intravenous, Rapid sequence and Cricoid pressure planned  PONV Risk Score and Plan: 3 and Treatment may vary due to age or medical condition  Airway Management Planned: Oral ETT  Additional Equipment:   Intra-op Plan:   Post-operative Plan: Extubation in OR  Informed Consent: I have reviewed the patients History and Physical, chart, labs and discussed the procedure including the risks, benefits and alternatives for the proposed anesthesia with the patient or authorized representative who has indicated his/her understanding and acceptance.   Patient has DNR.   Dental advisory given  Plan Discussed with: CRNA  Anesthesia Plan Comments:         Anesthesia Quick Evaluation

## 2019-04-19 NOTE — Progress Notes (Signed)
[facility] Documentation  Pt is currently a hospice pt with [facility]. Noted that pt is currently in ED due to having food bolus stuck in throat. Please reach out to [facility] for any questions re: goals of care.   Freddie Breech, RN [facility] Liaison [phone]

## 2019-04-19 NOTE — ED Notes (Signed)
Patient back from Endo.

## 2019-04-19 NOTE — ED Notes (Signed)
Patient transported to Endo 

## 2019-04-19 NOTE — ED Provider Notes (Signed)
[facility]-EMERGENCY DEPT Provider Note   CSN: [id number] Arrival date & time: 04/19/19  1548     History   Chief Complaint Chief Complaint  Patient presents with  . Swallowed Foreign Body    HPI [facility] is a 85 y.o. female.     HPI  85 year old female with esophageal food impaction.  She states that she was eating chicken and potatoes for lunch when she felt that it got stuck in her throat.  She reports a prior history of esophageal stricture with prior dilation.  She cannot remember who did this though.  Since lunchtime she has been unable to eat or drink anything.  She has tried warm tea, water and applesauce and all came right back up.  She is having to spit her saliva patient and cannot swallow it.  Past Medical History:  Diagnosis Date  . Depression   . GERD (gastroesophageal reflux disease)   . Glaucoma   . Hypertension   . Lumbar spinal stenosis   . Osteoporosis   . Right knee DJD   . Vertigo     Patient Active Problem List   Diagnosis Date Noted  . Palliative care by specialist   . Goals of care, counseling/discussion   . Chronic back pain   . Fall 03/31/2018  . Acute CVA (cerebrovascular accident) (HCC) 09/25/2017  . Benign essential HTN 09/25/2017  . Depression with anxiety 09/24/2017  . Acute posthemorrhagic anemia 05/27/2013  . Chronic low back pain 04/30/2013  . Glaucoma 04/30/2013  . Allergic rhinitis 04/30/2013  . GERD (gastroesophageal reflux disease) 04/30/2013  . CAD (coronary artery disease) 04/30/2013  . Constipation 04/30/2013  . Hip fracture (HCC) 04/24/2013  . Anemia 04/24/2013    Past Surgical History:  Procedure Laterality Date  . ABDOMINAL HYSTERECTOMY    . EYE SURGERY    . FRACTURE SURGERY    . HIP FRACTURE SURGERY Left   . INTRAMEDULLARY (IM) NAIL INTERTROCHANTERIC Right 04/24/2013   Procedure: INTRAMEDULLARY (IM) NAIL INTERTROCHANTRIC;  Surgeon: Nadara Mustard, MD;  Location: [facility] OR;  Service:  Orthopedics;  Laterality: Right;  . NECK SURGERY      OB History   No obstetric history on file.    Home Medications    Prior to Admission medications   Medication Sig Start Date End Date Taking? Authorizing Provider  acetaminophen (TYLENOL) 500 MG tablet Take 500 mg by mouth every 8 (eight) hours as needed for mild pain.   Yes [provider]  ALPRAZolam (XANAX) 0.25 MG tablet Take 0.5 tablets (0.125 mg total) by mouth at bedtime. 04/02/18  Yes Marinda Elk, MD  antiseptic oral rinse (BIOTENE) LIQD 15 mLs by Mouth Rinse route 3 (three) times daily. May self administer   Yes [provider]  bimatoprost (LUMIGAN) 0.01 % SOLN Place 1 drop into both eyes every evening.    Yes [provider]  citalopram (CELEXA) 10 MG tablet Take 10 mg by mouth daily.   Yes [provider]  clopidogrel (PLAVIX) 75 MG tablet Take 1 tablet (75 mg total) by mouth daily. 09/27/17  Yes Standley Brooking, MD  diphenhydrAMINE-zinc acetate (BENADRYL) cream Apply 1 application topically daily as needed for itching (chin).   Yes [provider]  ENSURE PLUS (ENSURE PLUS) LIQD Take 237 mLs by mouth daily.    Yes [provider]  fluticasone (FLONASE) 50 MCG/ACT nasal spray Place 1 spray into both nostrils 2 (two) times daily.    Yes  [provider]  furosemide (LASIX) 20 MG tablet Take 20 mg by mouth daily.   Yes [provider]  lidocaine (LIDODERM) 5 % Place 1 patch onto the skin daily as needed (for knee, hip and back pain). Remove & Discard patch within 12 hours or as directed by MD   Yes [provider]  loratadine (CLARITIN) 10 MG tablet Take 10 mg by mouth daily.    Yes [provider]  Melatonin ([employer] MELATONIN) 5 MG TABS Take 5 mg by mouth at bedtime.   Yes [provider]  Menthol-Methyl Salicylate (MUSCLE RUB) 10-15 % CREA Apply 1 application topically See admin instructions. Apply twice daily to both knees.  May use as needed for pain.   Yes [provider]  mirtazapine (REMERON) 15 MG tablet Take 15 mg by mouth at bedtime.   Yes [provider]  omeprazole (PRILOSEC OTC) 20 MG tablet Take 20 mg by mouth daily.   Yes [provider]  Oxycodone HCl 10 MG TABS Take 1 tablet (10 mg total) by mouth 2 (two) times daily. 04/02/18  Yes Marinda Elk, MD  phenylephrine-shark liver oil-mineral oil-petrolatum (PREPARATION H) 0.25-3-14-71.9 % rectal ointment Place 1 application rectally 2 (two) times daily as needed for hemorrhoids.   Yes [provider]  Polyethyl Glycol-Propyl Glycol (SYSTANE) 0.4-0.3 % SOLN Place 1 drop into both eyes 4 (four) times daily as needed (for dry eyes).    Yes [provider]  polyethylene glycol (MIRALAX / GLYCOLAX) packet Take 17 g by mouth daily.   Yes [provider]  potassium chloride 20 MEQ/15ML (10%) SOLN Take 10 mEq by mouth daily.   Yes [provider]  prednisoLONE acetate (PRED FORTE) 1 % ophthalmic suspension Place 1 drop into both eyes 2 (two) times daily.   Yes [provider]  predniSONE (DELTASONE) 10 MG tablet Take 10 mg by mouth See admin instructions. Take 40mg  by mouth once daily for 2 days, then take 30mg  by mouth once daily for 2 days, then take 20mg  by mouth once daily for 2 days, then take 10mg  by mouth once daily for 2 days, then discontinue.   Yes [provider]  psyllium (METAMUCIL) 58.6 % powder Take 1 packet by mouth daily. 3.4 grams   Yes [provider]  risperiDONE (RISPERDAL) 0.25 MG tablet Take 0.25 mg by mouth 2 (two) times daily.    Yes [provider]  senna-docusate (SENEXON-S) 8.6-50 MG tablet Take 1 tablet by mouth at bedtime.   Yes [provider]  sodium chloride (OCEAN) 0.65 % SOLN nasal spray Place 2 sprays into both nostrils 3 (three) times daily as needed for congestion.    Yes [provider]  timolol (BETIMOL) 0.25 %  ophthalmic solution Place 2 drops into both eyes 2 (two) times daily.    Yes [provider]  atorvastatin (LIPITOR) 80 MG tablet Take 1 tablet (80 mg total) by mouth daily at 6 PM. Patient not taking: Reported on 03/02/2019 09/26/17   , MD  metoprolol tartrate (LOPRESSOR) 25 MG tablet Take 0.5 tablets (12.5 mg total) by mouth 2 (two) times daily. Patient not taking: Reported on 03/02/2019 04/02/18   09/28/17, MD  morphine 20 MG/5ML solution Take 1.3 mLs (5.2 mg total) by mouth every 4 (four) hours as needed for pain. Patient not taking: Reported on 03/02/2019 04/02/18   04/04/18, MD  oxycodone (OXY-IR) 5 MG capsule Take 1 capsule (  5 mg total) by mouth 2 (two) times daily as needed for pain. Patient not taking: Reported on 04/19/2019 04/02/18   Charlynne Cousins, MD    Family History History reviewed. No pertinent family history.  Social History Social History   Tobacco Use  . Smoking status: Never Smoker  . Smokeless tobacco: Never Used  Substance Use Topics  . Alcohol use: No  . Drug use: No     Allergies   Betadine [povidone iodine], Lisinopril, Shellfish allergy, Macrolides and ketolides, Penicillins, Risperdal [risperidone], and Iodine   Review of Systems Review of Systems  All systems reviewed and negative, other than as noted in HPI.  Physical Exam Updated Vital Signs BP (!) 131/95   Pulse 72   Temp 98.3 F (36.8 C) (Oral)   Resp 16   SpO2 98%   Physical Exam Vitals signs and nursing note reviewed.  Constitutional:      General: She is not in acute distress.    Appearance: She is well-developed.     Comments: Sitting up in bed. Occasionally spitting into emesis bag.   HENT:     Head: Normocephalic and atraumatic.  Eyes:     General:        Right eye: No discharge.        Left eye: No discharge.     Conjunctiva/sclera: Conjunctivae normal.  Neck:     Musculoskeletal: Neck supple.  Cardiovascular:     Rate  and Rhythm: Normal rate and regular rhythm.     Heart sounds: Normal heart sounds. No murmur. No friction rub. No gallop.   Pulmonary:     Effort: Pulmonary effort is normal. No respiratory distress.     Breath sounds: Normal breath sounds.  Abdominal:     General: There is no distension.     Palpations: Abdomen is soft.     Tenderness: There is no abdominal tenderness.  Musculoskeletal:        General: No tenderness.  Skin:    General: Skin is warm and dry.  Neurological:     Mental Status: She is alert.  Psychiatric:        Behavior: Behavior normal.        Thought Content: Thought content normal.      ED Treatments / Results  Labs (all labs ordered are listed, but only abnormal results are displayed) Labs Reviewed  POCT I-STAT EG7 - Abnormal; Notable for the following components:      Result Value   pH, Ven 7.431 (*)    pCO2, Ven 41.6 (*)    Acid-Base Excess 3.0 (*)    All other components within normal limits  SARS CORONAVIRUS 2 BY RT PCR (HOSPITAL ORDER, [facility])    EKG None  Radiology No results found.  Procedures Procedures (including critical care time)  Medications Ordered in ED Medications  lactated ringers infusion ( Intravenous New Bag/Given 04/19/19 1713)  glucagon (human recombinant) (GLUCAGEN) injection 1 mg (1 mg Intravenous Given 04/19/19 1713)  nitroGLYCERIN (NITROSTAT) SL tablet 0.4 mg (0.4 mg Sublingual Given 04/19/19 1712)     Initial Impression / Assessment and Plan / ED Course  I have reviewed the triage vital signs and the nursing notes.  Pertinent labs & imaging results that were available during my care of the patient were reviewed by me and considered in my medical decision making (see chart for details).    89yF with symptoms consistent with esophageal food impaction. No respiratory distress.  Tried glucagon and nitroglycerin w/o improvement. Discussed with Dr Bosie ClosSchooler, GI. Needs COVID results. Informed  Dr Ewing SchleinMagod will likely be the provider by the time COVID testing completed. Pt updated. I also discussed the plan with her daughter. [phone].  7:31 PM Still awaiting COVID results. Symptoms remain unchanged. Still spitting into emesis bag.   Final Clinical Impressions(s) / ED Diagnoses   Final diagnoses:  Food impaction of esophagus, initial encounter    ED Discharge Orders    None       Raeford RazorKohut, Katha Kuehne, MD 04/20/19 1704

## 2019-04-19 NOTE — Op Note (Signed)
[facility] Patient Name: [facility] Procedure Date: 04/19/2019 MRN: [id number] Attending MD: Vida Rigger , MD Date of Birth: 27-Dec-1919 CSN: [id number] Age: 85 Admit Type: Outpatient Procedure:                Upper GI endoscopy Indications:              Foreign body in the esophagus in patient with known                            dysphasia Providers:                Vida Rigger, MD, Dayton Bailiff, RN, Margaree Mackintosh, RN, Beryle Beams, Technician Referring MD:              Medicines:                General Anesthesia Complications:            No immediate complications. Estimated Blood Loss:     Estimated blood loss: none. Procedure:                Pre-Anesthesia Assessment:                           - Prior to the procedure, a History and Physical                            was performed, and patient medications and                            allergies were reviewed. The patient's tolerance of                            previous anesthesia was also reviewed. The risks                            and benefits of the procedure and the sedation                            options and risks were discussed with the patient.                            All questions were answered, and informed consent                            was obtained. Prior Anticoagulants: The patient has                            taken Plavix (clopidogrel), last dose was day of                            procedure. ASA Grade Assessment: III - A patient  with severe systemic disease. After reviewing the                            risks and benefits, the patient was deemed in                            satisfactory condition to undergo the procedure.                           After obtaining informed consent, the endoscope was                            passed under direct vision. Throughout the                            procedure, the  patient's blood pressure, pulse, and                            oxygen saturations were monitored continuously. The                            GIF-H190 ([id number] Olympus gastroscope was                            introduced through the mouth, and advanced to the                            second part of duodenum. The upper GI endoscopy was                            accomplished without difficulty. The patient                            tolerated the procedure well. Scope In: Scope Out: Findings:      A small hiatal hernia was present.      Food was found in the upper third of the esophagus. Removal of food was       accomplished using the Lucina Mellow net and removing a few small pieces and then       washing and pushing the wrist into the stomach.      Diffuse mild inflammation characterized by congestion (edema) was found       in the entire examined stomach.      The duodenal bulb, first portion of the duodenum and second portion of       the duodenum were normal.      The exam was otherwise without abnormality. Impression:               - Small hiatal hernia.                           - Food in the upper third of the esophagus. Removal                            was successful.                           -  Atrophic gastritis.                           - Normal duodenal bulb, first portion of the                            duodenum and second portion of the duodenum.                           - The examination was otherwise normal. Moderate Sedation:      Not Applicable - Patient had care per Anesthesia. Recommendation:           - Patient has a contact number available for                            emergencies. The signs and symptoms of potential                            delayed complications were discussed with the                            patient. Return to normal activities tomorrow.                            Written discharge instructions were provided to the                             patient.                           - Clear liquid diet today. If doing okay tomorrow                            may resume her pured and full liquid diet                           - Continue present medications. Hold Plavix for 2                            days                           - Return to GI clinic PRN.                           - Telephone GI clinic if symptomatic PRN. Procedure Code(s):        --- Professional ---                           [phone], Esophagogastroduodenoscopy, flexible,                            transoral; with removal of foreign body(s) Diagnosis Code(s):        --- Professional ---  K44.9, Diaphragmatic hernia without obstruction or                            gangrene                           T18.128A, Food in esophagus causing other injury,                            initial encounter                           K29.40, Chronic atrophic gastritis without bleeding                           T18.108A, Unspecified foreign body in esophagus                            causing other injury, initial encounter CPT copyright 2019 [employer]. All rights reserved. The codes documented in this report are preliminary and upon coder review may  be revised to meet current compliance requirements. Clarene Essex, MD 04/19/2019 9:15:46 PM This report has been signed electronically. Number of Addenda: 0

## 2019-04-19 NOTE — Addendum Note (Signed)
Addendum  created 04/19/19 2203 by Lissa Morales, CRNA   Intraprocedure Meds edited

## 2019-04-19 NOTE — ED Triage Notes (Signed)
EMS reports from [facility], Pt c/o esophogeal stricture, has hx of same with prior stretching. Pt states she was eating lunch, possible food bolus in throat, believes she is unable to swallow the food eaten.  BP 198/88 HR 68 RR 18 Sp02 98  20ga LAC

## 2019-04-19 NOTE — Anesthesia Postprocedure Evaluation (Signed)
Anesthesia Post Note  Patient: [facility]  Procedure(s) Performed: ESOPHAGOGASTRODUODENOSCOPY (EGD) (N/A ) FOREIGN BODY REMOVAL     Patient location during evaluation: PACU Anesthesia Type: General Level of consciousness: awake and alert Pain management: pain level controlled Vital Signs Assessment: post-procedure vital signs reviewed and stable Respiratory status: spontaneous breathing, nonlabored ventilation and respiratory function stable Cardiovascular status: blood pressure returned to baseline and stable Postop Assessment: no apparent nausea or vomiting Anesthetic complications: no    Last Vitals:  Vitals:   04/19/19 2150 04/19/19 2155  BP: (!) 227/75 (!) 203/64  Pulse: 78 81  Resp: (!) 21 (!) 23  Temp:    SpO2: 95% 96%    Last Pain:  Vitals:   04/19/19 2130  TempSrc:   PainSc: Asleep                 Karlen Barbar,W. EDMOND

## 2019-04-19 NOTE — Anesthesia Procedure Notes (Signed)
Procedure Name: Intubation Date/Time: 04/19/2019 8:54 PM Performed by: Lissa Morales, CRNA Pre-anesthesia Checklist: Patient identified, Emergency Drugs available, Suction available and Patient being monitored Patient Re-evaluated:Patient Re-evaluated prior to induction Oxygen Delivery Method: Circle system utilized Preoxygenation: Pre-oxygenation with 100% oxygen Induction Type: IV induction Laryngoscope Size: Mac and 4 Grade View: Grade II Tube type: Oral Tube size: 7.0 mm Number of attempts: 1 Airway Equipment and Method: Stylet and Oral airway Placement Confirmation: ETT inserted through vocal cords under direct vision,  positive ETCO2 and breath sounds checked- equal and bilateral Secured at: 21 cm Tube secured with: Tape Dental Injury: Teeth and Oropharynx as per pre-operative assessment

## 2019-04-19 NOTE — ED Notes (Signed)
Leavy Cella, daughter would like to speak to the doctor or nurse with an update on her mother, [phone].

## 2019-04-19 NOTE — Discharge Instructions (Signed)
Clear liquids only tonight and if doing well tomorrow may have her pured diet and full liquid diet and hold her Plavix for 2 days and please call us if we could be of any future assistance from a GI standpoint

## 2019-04-20 ENCOUNTER — Encounter (HOSPITAL_COMMUNITY): Payer: Self-pay | Admitting: Gastroenterology

## 2019-04-20 NOTE — ED Provider Notes (Signed)
Assumed care from Dr. Wilson Singer at shift change.  See prior notes for full H&P.  Briefly, 85 year old female here with esophageal food impaction.  GI was waiting for COVID test before taking for endoscopy.  Plan: Endoscopy, observe, likely discharge  Patient has been observed here after endoscopy without any acute changes.  She is tolerating oral fluids without issue.  Her vitals remained stable.  Feel she is stable for discharge back to her hospice home.  Gertie Fey has recommended liquids tonight and may progress back to pureed diet tomorrow if doing well.  Hold plavix for 2 days.  FU in their office for any ongoing issues.  She may return here for any new/acute changes.  [facility] called for transport back to hospice home.   Larene Pickett, PA-C 04/20/19 0308    Ezequiel Essex, MD 04/20/19 [phone]

## 2019-04-20 NOTE — ED Notes (Signed)
[facility] called to transport patient to [facility]

## 2019-05-22 ENCOUNTER — Emergency Department (HOSPITAL_COMMUNITY)
Admission: EM | Admit: 2019-05-22 | Discharge: 2019-05-23 | Disposition: A | Discharge status: Skilled Nursing Facility | Attending: Emergency Medicine | Admitting: Emergency Medicine

## 2019-05-22 ENCOUNTER — Emergency Department (HOSPITAL_COMMUNITY): Admitting: Anesthesiology

## 2019-05-22 ENCOUNTER — Encounter (HOSPITAL_COMMUNITY)
Admission: EM | Disposition: A | Discharge status: Skilled Nursing Facility | Payer: Self-pay | Source: Home / Self Care | Attending: Emergency Medicine

## 2019-05-22 ENCOUNTER — Encounter (HOSPITAL_COMMUNITY): Payer: Self-pay

## 2019-05-22 ENCOUNTER — Other Ambulatory Visit: Payer: Self-pay

## 2019-05-22 DIAGNOSIS — Z20828 Contact with and (suspected) exposure to other viral communicable diseases: Secondary | ICD-10-CM | POA: Diagnosis not present

## 2019-05-22 DIAGNOSIS — F419 Anxiety disorder, unspecified: Secondary | ICD-10-CM | POA: Insufficient documentation

## 2019-05-22 DIAGNOSIS — I2510 Atherosclerotic heart disease of native coronary artery without angina pectoris: Secondary | ICD-10-CM | POA: Insufficient documentation

## 2019-05-22 DIAGNOSIS — T18128A Food in esophagus causing other injury, initial encounter: Secondary | ICD-10-CM | POA: Diagnosis not present

## 2019-05-22 DIAGNOSIS — Z8673 Personal history of transient ischemic attack (TIA), and cerebral infarction without residual deficits: Secondary | ICD-10-CM | POA: Diagnosis not present

## 2019-05-22 DIAGNOSIS — K219 Gastro-esophageal reflux disease without esophagitis: Secondary | ICD-10-CM | POA: Diagnosis not present

## 2019-05-22 DIAGNOSIS — Z7902 Long term (current) use of antithrombotics/antiplatelets: Secondary | ICD-10-CM | POA: Insufficient documentation

## 2019-05-22 DIAGNOSIS — W44F3XA Food entering into or through a natural orifice, initial encounter: Secondary | ICD-10-CM | POA: Diagnosis present

## 2019-05-22 DIAGNOSIS — Z79899 Other long term (current) drug therapy: Secondary | ICD-10-CM | POA: Insufficient documentation

## 2019-05-22 DIAGNOSIS — K222 Esophageal obstruction: Secondary | ICD-10-CM | POA: Diagnosis not present

## 2019-05-22 DIAGNOSIS — X58XXXA Exposure to other specified factors, initial encounter: Secondary | ICD-10-CM | POA: Insufficient documentation

## 2019-05-22 DIAGNOSIS — K5900 Constipation, unspecified: Secondary | ICD-10-CM | POA: Diagnosis not present

## 2019-05-22 DIAGNOSIS — F329 Major depressive disorder, single episode, unspecified: Secondary | ICD-10-CM | POA: Insufficient documentation

## 2019-05-22 DIAGNOSIS — I10 Essential (primary) hypertension: Secondary | ICD-10-CM | POA: Diagnosis not present

## 2019-05-22 DIAGNOSIS — H409 Unspecified glaucoma: Secondary | ICD-10-CM | POA: Insufficient documentation

## 2019-05-22 DIAGNOSIS — K449 Diaphragmatic hernia without obstruction or gangrene: Secondary | ICD-10-CM | POA: Insufficient documentation

## 2019-05-22 DIAGNOSIS — Z79891 Long term (current) use of opiate analgesic: Secondary | ICD-10-CM | POA: Insufficient documentation

## 2019-05-22 HISTORY — PX: ESOPHAGOGASTRODUODENOSCOPY (EGD) WITH PROPOFOL: SHX5813

## 2019-05-22 HISTORY — PX: FOREIGN BODY REMOVAL: SHX962

## 2019-05-22 LAB — I-STAT CHEM 8, ED
BUN: 17 mg/dL (ref 8–23)
Calcium, Ion: 1.09 mmol/L — ABNORMAL LOW (ref 1.15–1.40)
Chloride: 99 mmol/L (ref 98–111)
Creatinine, Ser: 1.2 mg/dL — ABNORMAL HIGH (ref 0.44–1.00)
Glucose, Bld: 109 mg/dL — ABNORMAL HIGH (ref 70–99)
HCT: 40 % (ref 36.0–46.0)
Hemoglobin: 13.6 g/dL (ref 12.0–15.0)
Potassium: 3.9 mmol/L (ref 3.5–5.1)
Sodium: 138 mmol/L (ref 135–145)
TCO2: 27 mmol/L (ref 22–32)

## 2019-05-22 LAB — SARS CORONAVIRUS 2 BY RT PCR (HOSPITAL ORDER, PERFORMED IN ~~LOC~~ HOSPITAL LAB): SARS Coronavirus 2: NEGATIVE

## 2019-05-22 SURGERY — ESOPHAGOGASTRODUODENOSCOPY (EGD) WITH PROPOFOL
Anesthesia: General

## 2019-05-22 MED ORDER — LACTATED RINGERS IV SOLN
INTRAVENOUS | Status: DC | PRN
  Administered 2019-05-22: 22:00:00 via INTRAVENOUS

## 2019-05-22 MED ORDER — PROPOFOL 10 MG/ML IV BOLUS
INTRAVENOUS | Status: AC
  Filled 2019-05-22: qty 20

## 2019-05-22 MED ORDER — SUCCINYLCHOLINE CHLORIDE 200 MG/10ML IV SOSY
PREFILLED_SYRINGE | INTRAVENOUS | Status: DC | PRN
  Administered 2019-05-22: 100 mg via INTRAVENOUS

## 2019-05-22 MED ORDER — LABETALOL HCL 5 MG/ML IV SOLN
INTRAVENOUS | Status: DC | PRN
  Administered 2019-05-22 (×2): 5 mg via INTRAVENOUS

## 2019-05-22 MED ORDER — LIDOCAINE 2% (20 MG/ML) 5 ML SYRINGE
INTRAMUSCULAR | Status: DC | PRN
  Administered 2019-05-22: 75 mg via INTRAVENOUS

## 2019-05-22 MED ORDER — PROPOFOL 10 MG/ML IV BOLUS
INTRAVENOUS | Status: DC | PRN
  Administered 2019-05-22: 70 mg via INTRAVENOUS
  Administered 2019-05-22: 30 mg via INTRAVENOUS

## 2019-05-22 SURGICAL SUPPLY — 15 items

## 2019-05-22 NOTE — Anesthesia Preprocedure Evaluation (Addendum)
Anesthesia Evaluation  Patient identified by MRN, date of birth, ID band Patient awake    Reviewed: Allergy & Precautions, H&P , NPO status , Patient's Chart, lab work & pertinent test results, reviewed documented beta blocker date and time   Airway Mallampati: II  TM Distance: >3 FB Neck ROM: Full    Dental no notable dental hx. (+) Partial Lower, Partial Upper, Dental Advisory Given   Pulmonary neg pulmonary ROS,    Pulmonary exam normal breath sounds clear to auscultation       Cardiovascular hypertension, Pt. on medications and Pt. on home beta blockers + CAD  + Valvular Problems/Murmurs AS  Rhythm:Regular Rate:Normal + Systolic murmurs    Neuro/Psych Anxiety Depression CVA    GI/Hepatic Neg liver ROS, GERD  Medicated,  Endo/Other  negative endocrine ROS  Renal/GU negative Renal ROS  negative genitourinary   Musculoskeletal  (+) Arthritis ,   Abdominal   Peds  Hematology negative hematology ROS (+)   Anesthesia Other Findings   Reproductive/Obstetrics negative OB ROS                            Anesthesia Physical Anesthesia Plan  ASA: IV and emergent  Anesthesia Plan: General   Post-op Pain Management:    Induction: Intravenous, Rapid sequence and Cricoid pressure planned  PONV Risk Score and Plan: 3 and Treatment may vary due to age or medical condition  Airway Management Planned: Oral ETT  Additional Equipment:   Intra-op Plan:   Post-operative Plan: Extubation in OR  Informed Consent: I have reviewed the patients History and Physical, chart, labs and discussed the procedure including the risks, benefits and alternatives for the proposed anesthesia with the patient or authorized representative who has indicated his/her understanding and acceptance.     Dental advisory given  Plan Discussed with: CRNA  Anesthesia Plan Comments:         Anesthesia Quick  Evaluation

## 2019-05-22 NOTE — ED Notes (Signed)
[facility] called for transport.  

## 2019-05-22 NOTE — Op Note (Signed)
[facility] Patient Name: Yvette Carroll Procedure Date: 05/22/2019 MRN: [id number] Attending MD: Lear Ng , MD Date of Birth: 1920-04-20 CSN: [id number] Age: 85 Admit Type: Emergency Department Procedure:                Upper GI endoscopy Indications:              Dysphagia, Foreign body in the esophagus Providers:                Lear Ng, MD, Josie Dixon, RN,                            Laverda Sorenson, Technician, Yvette Carroll. Yvette Carroll,                            Carroll Referring MD:             ER Medicines:                Propofol per Anesthesia, See the Anesthesia note                            for documentation of the administered medications Complications:            No immediate complications. Estimated Blood Loss:     Estimated blood loss was minimal. Procedure:                Pre-Anesthesia Assessment:                           - Prior to the procedure, a History and Physical                            was performed, and patient medications and                            allergies were reviewed. The patient's tolerance of                            previous anesthesia was also reviewed. The risks                            and benefits of the procedure and the sedation                            options and risks were discussed with the patient.                            All questions were answered, and informed consent                            was obtained. Prior Anticoagulants: The patient has                            taken Plavix (clopidogrel). ASA Grade Assessment:  IV - A patient with severe systemic disease that is                            a constant threat to life. After reviewing the                            risks and benefits, the patient was deemed in                            satisfactory condition to undergo the procedure.                           After obtaining informed consent, the  endoscope was                            passed under direct vision. Throughout the                            procedure, the patient's blood pressure, pulse, and                            oxygen saturations were monitored continuously. The                            GIF-H190 ([id number] Olympus gastroscope was                            introduced through the mouth, and advanced to the                            second part of duodenum. The upper GI endoscopy was                            accomplished without difficulty. The patient                            tolerated the procedure well. Scope In: Scope Out: Findings:      Two benign-appearing, intrinsic moderate (circumferential scarring or       stenosis; an endoscope may pass) stenoses were found 20 cm from the       incisors. The stenoses were traversed.      The Z-line was narrowed and 32.      Food was found in the mid esophagus. Food irrigated into the stomach.       Large amount of stomach seen in hiatal hernia.      A large amount of food (residue) was found in the cardia.      A medium-sized hiatal hernia was present.      The examined duodenum was normal. Impression:               - Benign-appearing esophageal stenoses.                           - Z-line narrowed and 32.                           -  Food in the mid esophagus.                           - A large amount of food (residue) in the stomach.                           - Medium-sized hiatal hernia.                           - Normal examined duodenum.                           - No specimens collected. Moderate Sedation:      Not Applicable - Patient had care per Anesthesia. Recommendation:           - Patient has a contact number available for                            emergencies. The signs and symptoms of potential                            delayed complications were discussed with the                            patient. Return to normal activities tomorrow.                             Written discharge instructions were provided to the                            patient.                           - Clear liquid diet and advance slowly as tolerated                            to a PUREED diet.NO SOLID FOOD. Procedure Code(s):        --- Professional ---                           [phone], Esophagogastroduodenoscopy, flexible,                            transoral; diagnostic, including collection of                            specimen(s) by brushing or washing, when performed                            (separate procedure) Diagnosis Code(s):        --- Professional ---                           U04.540JT18.128A, Food in esophagus causing other injury,  initial encounter                           K22.2, Esophageal obstruction                           R13.10, Dysphagia, unspecified                           T18.108A, Unspecified foreign body in esophagus                            causing other injury, initial encounter                           K44.9, Diaphragmatic hernia without obstruction or                            gangrene CPT copyright 2019 [employer]. All rights reserved. The codes documented in this report are preliminary and upon coder review may  be revised to meet current compliance requirements. Shirley Friar, MD 05/22/2019 11:04:29 PM This report has been signed electronically. Number of Addenda: 0

## 2019-05-22 NOTE — Discharge Instructions (Signed)
LIQUID DIET ONLY tomorrow (Saturday 05/23/19) morning and advance as tolerated to PUREED diet only. NO SOLID FOOD.

## 2019-05-22 NOTE — Brief Op Note (Signed)
S/P Food impaction with EGD clearing the esophagus. Liquid diet only tomorrow morning and advance as tolerated to a PUREED Diet. NO SOLID FOOD. Discussed with her daughter, Butch Penny.

## 2019-05-22 NOTE — ED Triage Notes (Signed)
Arrived by EMS from [facility]. Patient reports difficulty swallowing secondary to possible food bolus. Patient states she had [employer] for dinner. Patient seen in this ED 2 days ago for same. Airway patent. Patient also reports difficulty swallowing.

## 2019-05-22 NOTE — H&P (View-Only) (Signed)
Referring Provider: Dr. Rush Landmark Primary Care Physician:  Pearson Grippe, MD Primary Gastroenterologist:  Dr. Ewing Schlein  Reason for Consultation:  Food Impaction  HPI: Yvette Carroll is a 85 y.o. female presents after eating chicken this evening at her nursing facility and feeling it stuck in her esophagus. Has been unable to swallow her saliva since that time. Denies abdominal pain, chest pain, SOB, or vomiting. She reports a history of GERD. She is pleasantly confused. EGD in 10/20 by Dr. Ewing Schlein for food impaction and no stricture seen. Small hiatal hernia noted. She is on Plavix. She was on a pureed diet but daughter reports her mother refused it and was given chopped up chicken. Informed consent for the procedure obtained from her daughter, [name].  Past Medical History:  Diagnosis Date  . Depression   . GERD (gastroesophageal reflux disease)   . Glaucoma   . Hypertension   . Lumbar spinal stenosis   . Osteoporosis   . Right knee DJD   . Vertigo     Past Surgical History:  Procedure Laterality Date  . ABDOMINAL HYSTERECTOMY    . ESOPHAGOGASTRODUODENOSCOPY N/A 04/19/2019   Procedure: ESOPHAGOGASTRODUODENOSCOPY (EGD);  Surgeon: Vida Rigger, MD;  Location: [facility];  Service: Endoscopy;  Laterality: N/A;  . EYE SURGERY    . FOREIGN BODY REMOVAL  04/19/2019   Procedure: FOREIGN BODY REMOVAL;  Surgeon: Vida Rigger, MD;  Location: [facility]COPY;  Service: Endoscopy;;  . FRACTURE SURGERY    . HIP FRACTURE SURGERY Left   . INTRAMEDULLARY (IM) NAIL INTERTROCHANTERIC Right 04/24/2013   Procedure: INTRAMEDULLARY (IM) NAIL INTERTROCHANTRIC;  Surgeon: Nadara Mustard, MD;  Location: [facility] OR;  Service: Orthopedics;  Laterality: Right;  . NECK SURGERY      Prior to Admission medications   Medication Sig Start Date End Date Taking? Authorizing Provider  acetaminophen (TYLENOL) 500 MG tablet Take 500 mg by mouth every 8 (eight) hours as needed for mild pain.   Yes [provider]  ALPRAZolam  (XANAX) 0.25 MG tablet Take 0.5 tablets (0.125 mg total) by mouth at bedtime. 04/02/18  Yes Marinda Elk, MD  antiseptic oral rinse (BIOTENE) LIQD 15 mLs by Mouth Rinse route 3 (three) times daily. May self administer   Yes [provider]  bimatoprost (LUMIGAN) 0.01 % SOLN Place 1 drop into both eyes every evening.    Yes [provider]  citalopram (CELEXA) 10 MG tablet Take 10 mg by mouth daily.   Yes [provider]  clopidogrel (PLAVIX) 75 MG tablet Take 1 tablet (75 mg total) by mouth daily. 09/27/17  Yes Standley Brooking, MD  diphenhydrAMINE-zinc acetate (BENADRYL) cream Apply 1 application topically daily as needed for itching (chin).   Yes [provider]  Docosanol (ABREVA) 10 % CREA Apply 1 application topically 4 (four) times daily as needed (to sores in nose and fever blisters.).   Yes [provider]  ENSURE PLUS (ENSURE PLUS) LIQD Take 237 mLs by mouth daily.    Yes [provider]  fluticasone (FLONASE) 50 MCG/ACT nasal spray Place 1 spray into both nostrils 2 (two) times daily.    Yes [provider]  furosemide (LASIX) 20 MG tablet Take 20 mg by mouth daily.   Yes [provider]  lidocaine (LIDODERM) 5 % Place 1 patch onto the skin daily as needed (for knee, hip and back pain). Remove & Discard patch within 12 hours or as directed by MD   Yes [provider]  loratadine (CLARITIN) 10 MG tablet Take 10 mg by mouth daily.    Yes [provider]  Melatonin ([employer] MELATONIN) 5 MG TABS Take 5 mg by mouth at bedtime.   Yes [provider]  Menthol-Methyl Salicylate (MUSCLE RUB) 10-15 % CREA Apply 1 application topically See admin instructions. Apply twice daily to both knees. May use as needed for pain.   Yes [provider]  mirtazapine (REMERON) 15 MG tablet Take 15 mg by mouth at bedtime.   Yes [provider]  omeprazole (PRILOSEC OTC) 20 MG tablet Take 20 mg by  mouth daily.   Yes [provider]  oxycodone (OXY-IR) 5 MG capsule Take 1 capsule (5 mg total) by mouth 2 (two) times daily as needed for pain. 04/02/18  Yes Charlynne Cousins, MD  Oxycodone HCl 10 MG TABS Take 1 tablet (10 mg total) by mouth 2 (two) times daily. 04/02/18  Yes Charlynne Cousins, MD  phenylephrine-shark liver oil-mineral oil-petrolatum (PREPARATION H) 0.25-3-14-71.9 % rectal ointment Place 1 application rectally 2 (two) times daily as needed for hemorrhoids.   Yes [provider]  Polyethyl Glycol-Propyl Glycol (SYSTANE) 0.4-0.3 % SOLN Place 1 drop into both eyes 4 (four) times daily as needed (for dry eyes).    Yes [provider]  polyethylene glycol (MIRALAX / GLYCOLAX) packet Take 17 g by mouth daily.   Yes [provider]  potassium chloride 20 MEQ/15ML (10%) SOLN Take 10 mEq by mouth daily.   Yes [provider]  prednisoLONE acetate (PRED FORTE) 1 % ophthalmic suspension Place 1 drop into both eyes 2 (two) times daily.   Yes [provider]  psyllium (METAMUCIL) 58.6 % powder Take 1 packet by mouth daily. 3.4 grams   Yes [provider]  risperiDONE (RISPERDAL) 0.25 MG tablet Take 0.25 mg by mouth 2 (two) times daily.    Yes [provider]  risperiDONE (RISPERDAL) 1 MG/ML oral solution Take 0.25 mg by mouth 2 (two) times daily as needed (hallucinations).   Yes [provider]  senna-docusate (SENEXON-S) 8.6-50 MG tablet Take 1 tablet by mouth at bedtime.   Yes [provider]  sodium chloride (OCEAN) 0.65 % SOLN nasal spray Place 2 sprays into both nostrils 3 (three) times daily as needed for congestion.    Yes [provider]  timolol (BETIMOL) 0.25 % ophthalmic solution Place 2 drops into both eyes 2 (two) times daily.    Yes [provider]  atorvastatin (LIPITOR) 80 MG tablet Take 1 tablet (80 mg total) by mouth daily at 6 PM. Patient not taking: Reported on  03/02/2019 09/26/17   Samuella Cota, MD  metoprolol tartrate (LOPRESSOR) 25 MG tablet Take 0.5 tablets (12.5 mg total) by mouth 2 (two) times daily. Patient not taking: Reported on 05/22/2019 04/02/18   Charlynne Cousins, MD  morphine 20 MG/5ML solution Take 1.3 mLs (5.2 mg total) by mouth every 4 (four) hours as needed for pain. Patient not taking: Reported on 03/02/2019 04/02/18   Charlynne Cousins, MD  predniSONE (DELTASONE) 10 MG tablet Take 10 mg by mouth See admin instructions. Take 40mg  by mouth once daily for 2 days, then take 30mg  by mouth once daily for 2 days, then take 20mg  by mouth once daily for 2 days, then take 10mg  by mouth once daily for 2 days, then discontinue.    [provider]    Scheduled Meds: Continuous Infusions: PRN Meds:.  Allergies as of 05/22/2019 -  Review Complete 05/22/2019  Allergen Reaction Noted  . Betadine [povidone iodine] Itching and Rash 08/04/2012  . Lisinopril Anaphylaxis and Swelling 08/16/2016  . Shellfish allergy Anaphylaxis 08/12/2016  . Macrolides and ketolides Other (See Comments) 04/23/2013  . Penicillins Swelling 08/04/2012  . Risperdal [risperidone] Other (See Comments) 08/12/2016  . Iodine Rash 08/15/2016    No family history on file.  Social History   Socioeconomic History  . Marital status: Widowed    Spouse name: Not on file  . Number of children: Not on file  . Years of education: Not on file  . Highest education level: Not on file  Occupational History  . Not on file  Social Needs  . Financial resource strain: Not on file  . Food insecurity    Worry: Not on file    Inability: Not on file  . Transportation needs    Medical: Not on file    Non-medical: Not on file  Tobacco Use  . Smoking status: Never Smoker  . Smokeless tobacco: Never Used  Substance and Sexual Activity  . Alcohol use: No  . Drug use: No  . Sexual activity: Not on file  Lifestyle  . Physical activity    Days per week: Not on  file    Minutes per session: Not on file  . Stress: Not on file  Relationships  . Social connections    Talks on phone: Not on file    Gets together: Not on file    Attends religious service: Not on file    Active member of club or organization: Not on file    Attends meetings of clubs or organizations: Not on file    Relationship status: Not on file  . Intimate partner violence    Fear of current or ex partner: Not on file    Emotionally abused: Not on file    Physically abused: Not on file    Forced sexual activity: Not on file  Other Topics Concern  . Not on file  Social History Narrative  . Not on file    Review of Systems: All negative except as stated above in HPI.  Physical Exam: Vital signs: Vitals:   05/22/19 2100 05/22/19 2155  BP: (!) 171/99 (!) 228/78  Pulse: 72 81  Resp: 18 (!) 23  Temp:  97.8 F (36.6 C)  SpO2: 100% 99%     General:   Elderly, lethargic, thin, pleasant, no acute distress  Head: normocephalic, atraumatic Eyes: anicteric sclera ENT: oropharynx clear Neck: supple, nontender Lungs:  Clear throughout to auscultation.   No wheezes, crackles, or rhonchi. No acute distress. Heart:  Regular rate and rhythm; no murmurs, clicks, rubs,  or gallops. Abdomen: epigastric and periumbilical tenderness with minimal guarding, soft, nondistended, +BS  Rectal:  Deferred Ext: no edema  GI:  Lab Results: Recent Labs    05/22/19 1945  HGB 13.6  HCT 40.0   BMET Recent Labs    05/22/19 1945  NA 138  K 3.9  CL 99  GLUCOSE 109*  BUN 17  CREATININE 1.20*   LFT No results for input(s): PROT, ALBUMIN, AST, ALT, ALKPHOS, BILITOT, BILIDIR, IBILI in the last 72 hours. PT/INR No results for input(s): LABPROT, INR in the last 72 hours.   Studies/Results: No results found.  Impression/Plan: 85 yo with food impaction after eating chicken for dinner today. EGD with food bolus removal. Anesthesia to intubate prior to the procedure for airway  protection. Risks/benefits of the procedure explained to   her daughter, [name] who gave permission for the procedure.    LOS: 0 days   Shirley Friar  05/22/2019, 10:23 PM  Questions please call [phone]

## 2019-05-22 NOTE — ED Provider Notes (Signed)
[facility] DEPT Provider Note   CSN: [id number] Arrival date & time: 05/22/19  7893     History   Chief Complaint Chief Complaint  Patient presents with  . Food Bolus    HPI Yvette Carroll is a 85 y.o. female.     The history is provided by the patient and medical records. No language interpreter was used.  Illness Location:  Choked on chicken Severity:  Moderate Onset quality:  Sudden Duration:  1 hour Timing:  Constant Progression:  Unchanged Chronicity:  Recurrent Context:  Ate chicken for dinner and feels it is stuck again Associated symptoms: no abdominal pain, no chest pain, no congestion, no cough, no diarrhea, no fatigue, no fever, no headaches, no loss of consciousness, no nausea, no rhinorrhea, no shortness of breath, no vomiting and no wheezing     Past Medical History:  Diagnosis Date  . Depression   . GERD (gastroesophageal reflux disease)   . Glaucoma   . Hypertension   . Lumbar spinal stenosis   . Osteoporosis   . Right knee DJD   . Vertigo     Patient Active Problem List   Diagnosis Date Noted  . Palliative care by specialist   . Goals of care, counseling/discussion   . Chronic back pain   . Fall 03/31/2018  . Acute CVA (cerebrovascular accident) ([place]) 09/25/2017  . Benign essential HTN 09/25/2017  . Depression with anxiety 09/24/2017  . Acute posthemorrhagic anemia 05/27/2013  . Chronic low back pain 04/30/2013  . Glaucoma 04/30/2013  . Allergic rhinitis 04/30/2013  . GERD (gastroesophageal reflux disease) 04/30/2013  . CAD (coronary artery disease) 04/30/2013  . Constipation 04/30/2013  . Hip fracture ([place]) 04/24/2013  . Anemia 04/24/2013    Past Surgical History:  Procedure Laterality Date  . ABDOMINAL HYSTERECTOMY    . ESOPHAGOGASTRODUODENOSCOPY N/A 04/19/2019   Procedure: ESOPHAGOGASTRODUODENOSCOPY (EGD);  Surgeon: Clarene Essex, MD;  Location: [facility];  Service: Endoscopy;  Laterality: N/A;  .  EYE SURGERY    . FOREIGN BODY REMOVAL  04/19/2019   Procedure: FOREIGN BODY REMOVAL;  Surgeon: Clarene Essex, MD;  Location: [facility];  Service: Endoscopy;;  . FRACTURE SURGERY    . HIP FRACTURE SURGERY Left   . INTRAMEDULLARY (IM) NAIL INTERTROCHANTERIC Right 04/24/2013   Procedure: INTRAMEDULLARY (IM) NAIL INTERTROCHANTRIC;  Surgeon: Newt Minion, MD;  Location: [facility];  Service: Orthopedics;  Laterality: Right;  . NECK SURGERY       OB History   No obstetric history on file.      Home Medications    Prior to Admission medications   Medication Sig Start Date End Date Taking? Authorizing Provider  acetaminophen (TYLENOL) 500 MG tablet Take 500 mg by mouth every 8 (eight) hours as needed for mild pain.    [provider]  ALPRAZolam Duanne Moron) 0.25 MG tablet Take 0.5 tablets (0.125 mg total) by mouth at bedtime. 04/02/18   Charlynne Cousins, MD  antiseptic oral rinse (BIOTENE) LIQD 15 mLs by Mouth Rinse route 3 (three) times daily. May self administer    [provider]  atorvastatin (LIPITOR) 80 MG tablet Take 1 tablet (80 mg total) by mouth daily at 6 PM. Patient not taking: Reported on 03/02/2019 09/26/17   Samuella Cota, MD  bimatoprost (LUMIGAN) 0.01 % SOLN Place 1 drop into both eyes every evening.     [provider]  citalopram (CELEXA) 10 MG tablet Take 10 mg by mouth daily.  [provider]  clopidogrel (PLAVIX) 75 MG tablet Take 1 tablet (75 mg total) by mouth daily. 09/27/17   Standley BrookingGoodrich, Daniel P, MD  diphenhydrAMINE-zinc acetate (BENADRYL) cream Apply 1 application topically daily as needed for itching (chin).    [provider]  ENSURE PLUS (ENSURE PLUS) LIQD Take 237 mLs by mouth daily.     [provider]  fluticasone (FLONASE) 50 MCG/ACT nasal spray Place 1 spray into both nostrils 2 (two) times daily.     [provider]  furosemide (LASIX) 20 MG tablet Take 20 mg by mouth daily.    [provider]  lidocaine (LIDODERM) 5 % Place 1 patch onto the skin daily as needed (for knee, hip and back pain). Remove & Discard patch within 12 hours or as directed by MD    [provider]  loratadine (CLARITIN) 10 MG tablet Take 10 mg by mouth daily.     [provider]  Melatonin ([employer] MELATONIN) 5 MG TABS Take 5 mg by mouth at bedtime.    [provider]  Menthol-Methyl Salicylate (MUSCLE RUB) 10-15 % CREA Apply 1 application topically See admin instructions. Apply twice daily to both knees. May use as needed for pain.    [provider]  metoprolol tartrate (LOPRESSOR) 25 MG tablet Take 0.5 tablets (12.5 mg total) by mouth 2 (two) times daily. Patient not taking: Reported on 03/02/2019 04/02/18   Marinda ElkFeliz Ortiz, Abraham, MD  mirtazapine (REMERON) 15 MG tablet Take 15 mg by mouth at bedtime.    [provider]  morphine 20 MG/5ML solution Take 1.3 mLs (5.2 mg total) by mouth every 4 (four) hours as needed for pain. Patient not taking: Reported on 03/02/2019 04/02/18   Marinda ElkFeliz Ortiz, Abraham, MD  omeprazole (PRILOSEC OTC) 20 MG tablet Take 20 mg by mouth daily.    [provider]  oxycodone (OXY-IR) 5 MG capsule Take 1 capsule (5 mg total) by mouth 2 (two) times daily as needed for pain. Patient not taking: Reported on 04/19/2019 04/02/18   Marinda ElkFeliz Ortiz, Abraham, MD  Oxycodone HCl 10 MG TABS Take 1 tablet (10 mg total) by mouth 2 (two) times daily. 04/02/18   Marinda ElkFeliz Ortiz, Abraham, MD  phenylephrine-shark liver oil-mineral oil-petrolatum (PREPARATION H) 0.25-3-14-71.9 % rectal ointment Place 1 application rectally 2 (two) times daily as needed for hemorrhoids.    [provider]  Polyethyl Glycol-Propyl Glycol (SYSTANE) 0.4-0.3 % SOLN Place 1 drop into both eyes 4 (four) times daily as needed (for dry eyes).     [provider]  polyethylene glycol (MIRALAX / GLYCOLAX) packet Take 17 g by mouth daily.    [provider]   potassium chloride 20 MEQ/15ML (10%) SOLN Take 10 mEq by mouth daily.    [provider]  prednisoLONE acetate (PRED FORTE) 1 % ophthalmic suspension Place 1 drop into both eyes 2 (two) times daily.    [provider]  predniSONE (DELTASONE) 10 MG tablet Take 10 mg by mouth See admin instructions. Take 40mg  by mouth once daily for 2 days, then take 30mg  by mouth once daily for 2 days, then take 20mg  by mouth once daily for 2 days, then take 10mg  by mouth once daily for 2 days, then discontinue.    [provider]  psyllium (METAMUCIL) 58.6 % powder Take 1 packet by mouth daily. 3.4 grams    [provider]  risperiDONE (RISPERDAL) 0.25 MG tablet Take 0.25 mg by mouth 2 (two) times  daily.     [provider]  senna-docusate (SENEXON-S) 8.6-50 MG tablet Take 1 tablet by mouth at bedtime.    [provider]  sodium chloride (OCEAN) 0.65 % SOLN nasal spray Place 2 sprays into both nostrils 3 (three) times daily as needed for congestion.     [provider]  timolol (BETIMOL) 0.25 % ophthalmic solution Place 2 drops into both eyes 2 (two) times daily.     [provider]    Family History No family history on file.  Social History Social History   Tobacco Use  . Smoking status: Never Smoker  . Smokeless tobacco: Never Used  Substance Use Topics  . Alcohol use: No  . Drug use: No     Allergies   Betadine [povidone iodine], Lisinopril, Shellfish allergy, Macrolides and ketolides, Penicillins, Risperdal [risperidone], and Iodine   Review of Systems Review of Systems  Constitutional: Negative for chills, diaphoresis, fatigue and fever.  HENT: Positive for drooling and trouble swallowing. Negative for congestion and rhinorrhea.   Respiratory: Positive for choking. Negative for cough, chest tightness, shortness of breath and wheezing.   Cardiovascular: Negative for chest pain.  Gastrointestinal: Negative for abdominal  pain, constipation, diarrhea, nausea and vomiting.  Genitourinary: Negative for dysuria.  Musculoskeletal: Negative for back pain.  Neurological: Negative for loss of consciousness and headaches.  All other systems reviewed and are negative.    Physical Exam Updated Vital Signs BP (!) 208/91 Comment: MD Fitzgerald at bedside  Pulse 88   Temp 97.6 F (36.4 C) (Axillary)   Resp 18   Ht 5\' 2"  (1.575 m)   Wt 55.3 kg   SpO2 99%   BMI 22.31 kg/m   Physical Exam Vitals signs and nursing note reviewed.  Constitutional:      General: She is not in acute distress.    Appearance: She is well-developed. She is not ill-appearing, toxic-appearing or diaphoretic.  HENT:     Head: Normocephalic and atraumatic.     Right Ear: External ear normal.     Left Ear: External ear normal.     Nose: Nose normal. No congestion or rhinorrhea.     Mouth/Throat:     Mouth: Mucous membranes are moist.     Pharynx: No oropharyngeal exudate or posterior oropharyngeal erythema.  Eyes:     Extraocular Movements: Extraocular movements intact.     Conjunctiva/sclera: Conjunctivae normal.     Pupils: Pupils are equal, round, and reactive to light.  Neck:     Musculoskeletal: Normal range of motion and neck supple. No muscular tenderness.  Cardiovascular:     Rate and Rhythm: Normal rate.  Pulmonary:     Effort: Pulmonary effort is normal. No respiratory distress.     Breath sounds: No stridor. No wheezing, rhonchi or rales.  Chest:     Chest wall: No tenderness.  Abdominal:     General: Abdomen is flat. There is no distension.     Tenderness: There is no abdominal tenderness. There is no right CVA tenderness, left CVA tenderness or rebound.  Skin:    General: Skin is warm.     Findings: No erythema or rash.  Neurological:     Mental Status: She is alert and oriented to person, place, and time.     Motor: No abnormal muscle tone.     Coordination: Coordination normal.     Deep Tendon Reflexes:  Reflexes are normal and symmetric.  Psychiatric:  Mood and Affect: Mood normal.      ED Treatments / Results  Labs (all labs ordered are listed, but only abnormal results are displayed) Labs Reviewed  I-STAT CHEM 8, ED - Abnormal; Notable for the following components:      Result Value   Creatinine, Ser 1.20 (*)    Glucose, Bld 109 (*)    Calcium, Ion 1.09 (*)    All other components within normal limits  SARS CORONAVIRUS 2 BY RT PCR (HOSPITAL ORDER, PERFORMED IN [facility])    EKG None  Radiology No results found.  Procedures Procedures (including critical care time)  Medications Ordered in ED Medications  propofol (DIPRIVAN) 10 mg/mL bolus/IV push (has no administration in time range)     Initial Impression / Assessment and Plan / ED Course  I have reviewed the triage vital signs and the nursing notes.  Pertinent labs & imaging results that were available during my care of the patient were reviewed by me and considered in my medical decision making (see chart for details).        Yvette Carroll is a 85 y.o. female with a past medical history significant for CAD, GERD, prior stroke, hypertension, food bolus who presents with choking.  She reports that 2 days ago, she had to be in the emergency department and had EGD to remove a chicken food bolus impaction in her esophagus.  She reports that after removal, she was doing well and was able to eat and drink for the past 2 days without difficulty.  She reports that for breakfast she had oatmeal without problem, does not remember lunch, and then had chicken a la king for dinner around 1 hour ago.  She also says that she lost her upper dentures and is not able to chew as well as she normally does.  She says that when she was eating the chicken tonight, she again feels like something is stuck in her throat and she has had to spit up saliva and is having severe pain in her throat.  She says it feels the same as  it did 2 days ago when chicken was impacted in her esophagus.  She denies other symptoms including no recent fevers, chills, congestion, chest pain, shortness of breath, nausea, vomiting, urinary symptoms or GI symptoms.  Chart review shows that she had a negative Covid test 2 days ago.  On exam, patient did not have stridor and does not appear to be in respiratory distress.  Lungs are clear and chest is nontender.  Back is nontender.  Full range of motion of neck.  Patient resting comfortably but still feels like there is something stuck in her throat.  Will call GI to see what they recommend as far as any other work-up needed, given her recent negative Covid 2 days ago, am not certain if she will need to be Covid test again however anticipate they will likely need to perform EGD again.  Anticipate follow-up on GI recommendations.  7:27 PM Spoke with Dr. Bosie Clos with gastroenterology who recommends a rapid Covid and they will come perform EGD tonight in the emergency department.  He also recommended an i-STAT chemistry to help anesthesia if the need to do other interventions.  Tests were ordered and will await Covid test to return negative for EGD tonight.  11:42 PM Her Covid test was negative and she was taken to the endoscopy suite for food bolus removal.  Was removed without difficulty.  Patient  returned and is feeling much better and able to swallow.  She now knows to not eat chicken and needs only a pured diet.  She reported that she understood this plan to me.  Patient will be discharged home back to her facility for further outpatient management.  I called and spoke to the patient's daughter who is aware of the successful removal and she will be going back to her facility.   Final Clinical Impressions(s) / ED Diagnoses   Final diagnoses:  Food impaction of esophagus, initial encounter    ED Discharge Orders    None      Clinical Impression: 1. Food impaction of esophagus,  initial encounter     Disposition: Discharge  Condition: Good  I have discussed the results, Dx and Tx plan with the pt(& family if present). He/she/they expressed understanding and agree(s) with the plan. Discharge instructions discussed at great length. Strict return precautions discussed and pt &/or family have verbalized understanding of the instructions. No further questions at time of discharge.    Current Discharge Medication List      Follow Up: Vida RiggerMagod, Marc, MD [address] [place] [address] [phone]   As needed  [facility] [address] [id number] [place] [address] [place] [id number] [phone]       , Canary Brimhristopher J, MD 05/22/19 [phone]

## 2019-05-22 NOTE — Anesthesia Procedure Notes (Signed)
Procedure Name: Intubation Date/Time: 05/22/2019 10:33 PM Performed by: Lollie Sails, CRNA Pre-anesthesia Checklist: Patient identified, Emergency Drugs available, Suction available, Patient being monitored and Timeout performed Patient Re-evaluated:Patient Re-evaluated prior to induction Oxygen Delivery Method: Circle system utilized Preoxygenation: Pre-oxygenation with 100% oxygen Induction Type: IV induction, Rapid sequence and Cricoid Pressure applied Laryngoscope Size: Miller and 3 Grade View: Grade I Tube type: Oral Tube size: 7.5 mm Number of attempts: 1 Airway Equipment and Method: Stylet Placement Confirmation: ETT inserted through vocal cords under direct vision,  positive ETCO2 and breath sounds checked- equal and bilateral Secured at: 22 cm Tube secured with: Tape Dental Injury: Teeth and Oropharynx as per pre-operative assessment

## 2019-05-22 NOTE — Consult Note (Signed)
Referring Provider: Dr. Rush Landmark Primary Care Physician:  Pearson Grippe, MD Primary Gastroenterologist:  Dr. Ewing Schlein  Reason for Consultation:  Food Impaction  HPI: Yvette Carroll is a 85 y.o. female presents after eating chicken this evening at her nursing facility and feeling it stuck in her esophagus. Has been unable to swallow her saliva since that time. Denies abdominal pain, chest pain, SOB, or vomiting. She reports a history of GERD. She is pleasantly confused. EGD in 10/20 by Dr. Ewing Schlein for food impaction and no stricture seen. Small hiatal hernia noted. She is on Plavix. She was on a pureed diet but daughter reports her mother refused it and was given chopped up chicken. Informed consent for the procedure obtained from her daughter, [name].  Past Medical History:  Diagnosis Date  . Depression   . GERD (gastroesophageal reflux disease)   . Glaucoma   . Hypertension   . Lumbar spinal stenosis   . Osteoporosis   . Right knee DJD   . Vertigo     Past Surgical History:  Procedure Laterality Date  . ABDOMINAL HYSTERECTOMY    . ESOPHAGOGASTRODUODENOSCOPY N/A 04/19/2019   Procedure: ESOPHAGOGASTRODUODENOSCOPY (EGD);  Surgeon: Vida Rigger, MD;  Location: [facility];  Service: Endoscopy;  Laterality: N/A;  . EYE SURGERY    . FOREIGN BODY REMOVAL  04/19/2019   Procedure: FOREIGN BODY REMOVAL;  Surgeon: Vida Rigger, MD;  Location: [facility];  Service: Endoscopy;;  . FRACTURE SURGERY    . HIP FRACTURE SURGERY Left   . INTRAMEDULLARY (IM) NAIL INTERTROCHANTERIC Right 04/24/2013   Procedure: INTRAMEDULLARY (IM) NAIL INTERTROCHANTRIC;  Surgeon: Nadara Mustard, MD;  Location: [facility] OR;  Service: Orthopedics;  Laterality: Right;  . NECK SURGERY      Prior to Admission medications   Medication Sig Start Date End Date Taking? Authorizing Provider  acetaminophen (TYLENOL) 500 MG tablet Take 500 mg by mouth every 8 (eight) hours as needed for mild pain.   Yes [provider]  ALPRAZolam  (XANAX) 0.25 MG tablet Take 0.5 tablets (0.125 mg total) by mouth at bedtime. 04/02/18  Yes Marinda Elk, MD  antiseptic oral rinse (BIOTENE) LIQD 15 mLs by Mouth Rinse route 3 (three) times daily. May self administer   Yes [provider]  bimatoprost (LUMIGAN) 0.01 % SOLN Place 1 drop into both eyes every evening.    Yes [provider]  citalopram (CELEXA) 10 MG tablet Take 10 mg by mouth daily.   Yes [provider]  clopidogrel (PLAVIX) 75 MG tablet Take 1 tablet (75 mg total) by mouth daily. 09/27/17  Yes Standley Brooking, MD  diphenhydrAMINE-zinc acetate (BENADRYL) cream Apply 1 application topically daily as needed for itching (chin).   Yes [provider]  Docosanol (ABREVA) 10 % CREA Apply 1 application topically 4 (four) times daily as needed (to sores in nose and fever blisters.).   Yes [provider]  ENSURE PLUS (ENSURE PLUS) LIQD Take 237 mLs by mouth daily.    Yes [provider]  fluticasone (FLONASE) 50 MCG/ACT nasal spray Place 1 spray into both nostrils 2 (two) times daily.    Yes [provider]  furosemide (LASIX) 20 MG tablet Take 20 mg by mouth daily.   Yes [provider]  lidocaine (LIDODERM) 5 % Place 1 patch onto the skin daily as needed (for knee, hip and back pain). Remove & Discard patch within 12 hours or as directed by MD   Yes [provider]  loratadine (CLARITIN) 10 MG tablet Take 10 mg by mouth daily.    Yes [provider]  Melatonin ([employer] MELATONIN) 5 MG TABS Take 5 mg by mouth at bedtime.   Yes [provider]  Menthol-Methyl Salicylate (MUSCLE RUB) 10-15 % CREA Apply 1 application topically See admin instructions. Apply twice daily to both knees. May use as needed for pain.   Yes [provider]  mirtazapine (REMERON) 15 MG tablet Take 15 mg by mouth at bedtime.   Yes [provider]  omeprazole (PRILOSEC OTC) 20 MG tablet Take 20 mg by  mouth daily.   Yes [provider]  oxycodone (OXY-IR) 5 MG capsule Take 1 capsule (5 mg total) by mouth 2 (two) times daily as needed for pain. 04/02/18  Yes Charlynne Cousins, MD  Oxycodone HCl 10 MG TABS Take 1 tablet (10 mg total) by mouth 2 (two) times daily. 04/02/18  Yes Charlynne Cousins, MD  phenylephrine-shark liver oil-mineral oil-petrolatum (PREPARATION H) 0.25-3-14-71.9 % rectal ointment Place 1 application rectally 2 (two) times daily as needed for hemorrhoids.   Yes [provider]  Polyethyl Glycol-Propyl Glycol (SYSTANE) 0.4-0.3 % SOLN Place 1 drop into both eyes 4 (four) times daily as needed (for dry eyes).    Yes [provider]  polyethylene glycol (MIRALAX / GLYCOLAX) packet Take 17 g by mouth daily.   Yes [provider]  potassium chloride 20 MEQ/15ML (10%) SOLN Take 10 mEq by mouth daily.   Yes [provider]  prednisoLONE acetate (PRED FORTE) 1 % ophthalmic suspension Place 1 drop into both eyes 2 (two) times daily.   Yes [provider]  psyllium (METAMUCIL) 58.6 % powder Take 1 packet by mouth daily. 3.4 grams   Yes [provider]  risperiDONE (RISPERDAL) 0.25 MG tablet Take 0.25 mg by mouth 2 (two) times daily.    Yes [provider]  risperiDONE (RISPERDAL) 1 MG/ML oral solution Take 0.25 mg by mouth 2 (two) times daily as needed (hallucinations).   Yes [provider]  senna-docusate (SENEXON-S) 8.6-50 MG tablet Take 1 tablet by mouth at bedtime.   Yes [provider]  sodium chloride (OCEAN) 0.65 % SOLN nasal spray Place 2 sprays into both nostrils 3 (three) times daily as needed for congestion.    Yes [provider]  timolol (BETIMOL) 0.25 % ophthalmic solution Place 2 drops into both eyes 2 (two) times daily.    Yes [provider]  atorvastatin (LIPITOR) 80 MG tablet Take 1 tablet (80 mg total) by mouth daily at 6 PM. Patient not taking: Reported on  03/02/2019 09/26/17   Samuella Cota, MD  metoprolol tartrate (LOPRESSOR) 25 MG tablet Take 0.5 tablets (12.5 mg total) by mouth 2 (two) times daily. Patient not taking: Reported on 05/22/2019 04/02/18   Charlynne Cousins, MD  morphine 20 MG/5ML solution Take 1.3 mLs (5.2 mg total) by mouth every 4 (four) hours as needed for pain. Patient not taking: Reported on 03/02/2019 04/02/18   Charlynne Cousins, MD  predniSONE (DELTASONE) 10 MG tablet Take 10 mg by mouth See admin instructions. Take 40mg  by mouth once daily for 2 days, then take 30mg  by mouth once daily for 2 days, then take 20mg  by mouth once daily for 2 days, then take 10mg  by mouth once daily for 2 days, then discontinue.    [provider]    Scheduled Meds: Continuous Infusions: PRN Meds:.  Allergies as of 05/22/2019 -  Review Complete 05/22/2019  Allergen Reaction Noted  . Betadine [povidone iodine] Itching and Rash 08/04/2012  . Lisinopril Anaphylaxis and Swelling 08/16/2016  . Shellfish allergy Anaphylaxis 08/12/2016  . Macrolides and ketolides Other (See Comments) 04/23/2013  . Penicillins Swelling 08/04/2012  . Risperdal [risperidone] Other (See Comments) 08/12/2016  . Iodine Rash 08/15/2016    No family history on file.  Social History   Socioeconomic History  . Marital status: Widowed    Spouse name: Not on file  . Number of children: Not on file  . Years of education: Not on file  . Highest education level: Not on file  Occupational History  . Not on file  Social Needs  . Financial resource strain: Not on file  . Food insecurity    Worry: Not on file    Inability: Not on file  . Transportation needs    Medical: Not on file    Non-medical: Not on file  Tobacco Use  . Smoking status: Never Smoker  . Smokeless tobacco: Never Used  Substance and Sexual Activity  . Alcohol use: No  . Drug use: No  . Sexual activity: Not on file  Lifestyle  . Physical activity    Days per week: Not on  file    Minutes per session: Not on file  . Stress: Not on file  Relationships  . Social Musicianconnections    Talks on phone: Not on file    Gets together: Not on file    Attends religious service: Not on file    Active member of club or organization: Not on file    Attends meetings of clubs or organizations: Not on file    Relationship status: Not on file  . Intimate partner violence    Fear of current or ex partner: Not on file    Emotionally abused: Not on file    Physically abused: Not on file    Forced sexual activity: Not on file  Other Topics Concern  . Not on file  Social History Narrative  . Not on file    Review of Systems: All negative except as stated above in HPI.  Physical Exam: Vital signs: Vitals:   05/22/19 2100 05/22/19 2155  BP: (!) 171/99 (!) 228/78  Pulse: 72 81  Resp: 18 (!) 23  Temp:  97.8 F (36.6 C)  SpO2: 100% 99%     General:   Elderly, lethargic, thin, pleasant, no acute distress  Head: normocephalic, atraumatic Eyes: anicteric sclera ENT: oropharynx clear Neck: supple, nontender Lungs:  Clear throughout to auscultation.   No wheezes, crackles, or rhonchi. No acute distress. Heart:  Regular rate and rhythm; no murmurs, clicks, rubs,  or gallops. Abdomen: epigastric and periumbilical tenderness with minimal guarding, soft, nondistended, +BS  Rectal:  Deferred Ext: no edema  GI:  Lab Results: Recent Labs    05/22/19 1945  HGB 13.6  HCT 40.0   BMET Recent Labs    05/22/19 1945  NA 138  K 3.9  CL 99  GLUCOSE 109*  BUN 17  CREATININE 1.20*   LFT No results for input(s): PROT, ALBUMIN, AST, ALT, ALKPHOS, BILITOT, BILIDIR, IBILI in the last 72 hours. PT/INR No results for input(s): LABPROT, INR in the last 72 hours.   Studies/Results: No results found.  Impression/Plan: 85 yo with food impaction after eating chicken for dinner today. EGD with food bolus removal. Anesthesia to intubate prior to the procedure for airway  protection. Risks/benefits of the procedure explained to  her daughter, [name] who gave permission for the procedure.    LOS: 0 days   Shirley Friar  05/22/2019, 10:23 PM  Questions please call [phone]

## 2019-05-22 NOTE — ED Notes (Signed)
Patient transported to ENDO 

## 2019-05-22 NOTE — Interval H&P Note (Signed)
History and Physical Interval Note:  05/22/2019 10:31 PM  [name]  has presented today for surgery, with the diagnosis of Food impaction.  The various methods of treatment have been discussed with the patient and family. After consideration of risks, benefits and other options for treatment, the patient has consented to  Procedure(s): ESOPHAGOGASTRODUODENOSCOPY (EGD) WITH PROPOFOL (N/A) FOREIGN BODY REMOVAL (N/A) as a surgical intervention.  The patient's history has been reviewed, patient examined, no change in status, stable for surgery.  I have reviewed the patient's chart and labs.  Questions were answered to the patient's satisfaction.     Yvette Carroll

## 2019-05-22 NOTE — Transfer of Care (Signed)
Immediate Anesthesia Transfer of Care Note  Patient: Yvette Carroll  Procedure(s) Performed: ESOPHAGOGASTRODUODENOSCOPY (EGD) WITH PROPOFOL (N/A ) FOREIGN BODY REMOVAL (N/A )  Patient Location: PACU and Endoscopy Unit  Anesthesia Type:General  Level of Consciousness: awake and sedated  Airway & Oxygen Therapy: Patient Spontanous Breathing and Patient connected to face mask oxygen  Post-op Assessment: Report given to RN and Post -op Vital signs reviewed and stable  Post vital signs: Reviewed and stable  Last Vitals:  Vitals Value Taken Time  BP    Temp    Pulse    Resp    SpO2      Last Pain:  Vitals:   05/22/19 2155  TempSrc: Oral         Complications: No apparent anesthesia complications

## 2019-05-24 NOTE — Anesthesia Postprocedure Evaluation (Signed)
Anesthesia Post Note  Patient: FAITHANN NATAL  Procedure(s) Performed: ESOPHAGOGASTRODUODENOSCOPY (EGD) WITH PROPOFOL (N/A ) FOREIGN BODY REMOVAL (N/A )     Patient location during evaluation: Endoscopy Anesthesia Type: General Level of consciousness: awake and alert Pain management: pain level controlled Vital Signs Assessment: post-procedure vital signs reviewed and stable Respiratory status: spontaneous breathing, nonlabored ventilation and respiratory function stable Cardiovascular status: blood pressure returned to baseline and stable Postop Assessment: no apparent nausea or vomiting Anesthetic complications: no    Last Vitals:  Vitals:   05/22/19 2310 05/23/19 0020  BP: (!) 208/91 (!) 208/91  Pulse: 88 88  Resp: 18 20  Temp:    SpO2: 99% 99%    Last Pain:  Vitals:   05/23/19 0020  TempSrc:   PainSc: 4                  Shahin Knierim,W. EDMOND

## 2019-05-25 ENCOUNTER — Encounter (HOSPITAL_COMMUNITY): Payer: Self-pay | Admitting: Gastroenterology

## 2021-01-27 IMAGING — CT CT HEAD WITHOUT CONTRAST
4 series · 15 of 47 positions shown, 17 images · non-contrast
Comparison: CT scan of March 31, 2018.

CLINICAL DATA: Posttraumatic headache and neck pain after fall
today. No loss of consciousness.

EXAM:
CT HEAD WITHOUT CONTRAST
CT CERVICAL SPINE WITHOUT CONTRAST
TECHNIQUE: Multidetector CT imaging of the head and cervical spine was
performed following the standard protocol without intravenous
contrast. Multiplanar CT image reconstructions of the cervical spine
were also generated.

[Series 3: head without · axial · non-contrast · 0.43mm/px · z∈[-37,+88]mm · 7 of 35 slices shown, 9 images]
[im 5/35  brain]
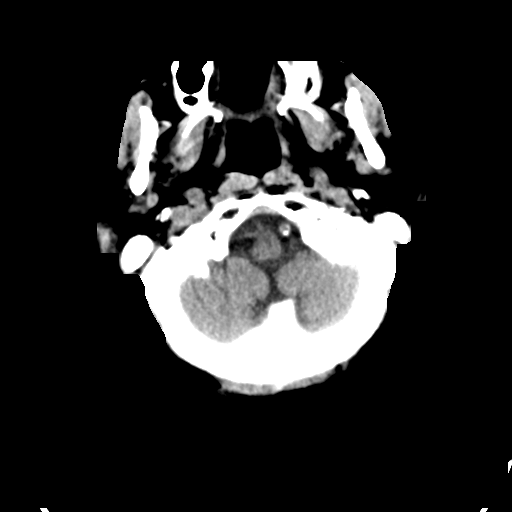
[im 5/35  bone]
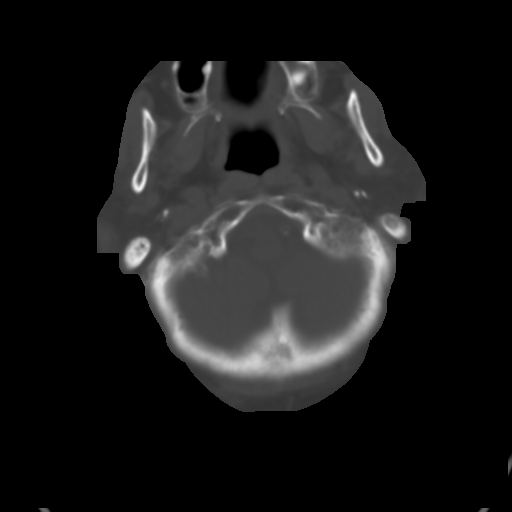
[im 9/35  brain]
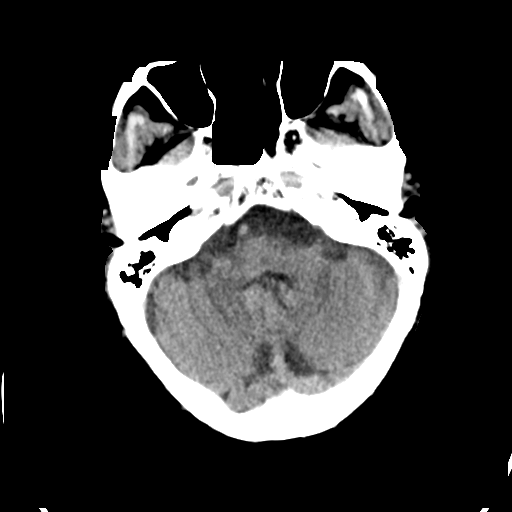
[im 13/35  brain]
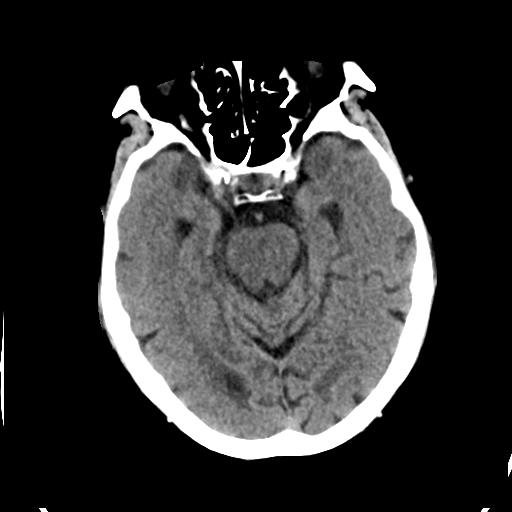
[im 18/35  brain]
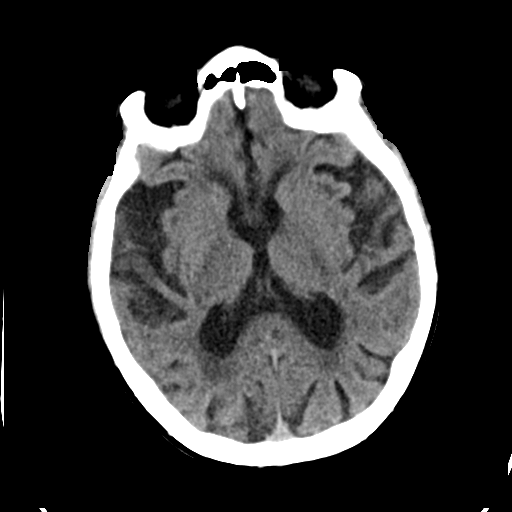
[im 22/35  brain]
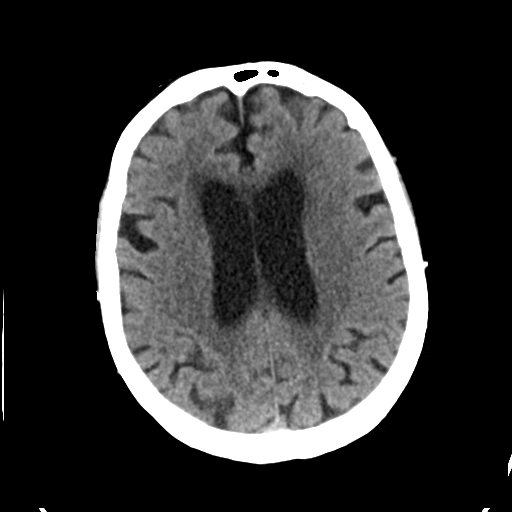
[im 22/35  bone]
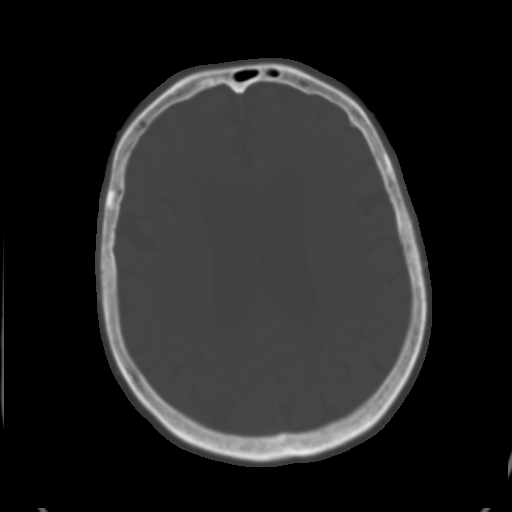
[im 26/35  brain]
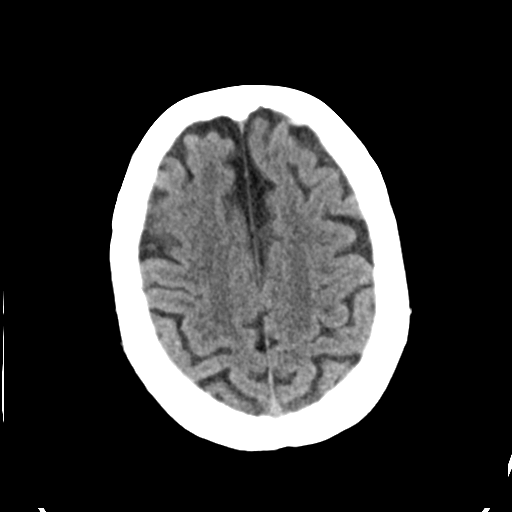
[im 30/35  brain]
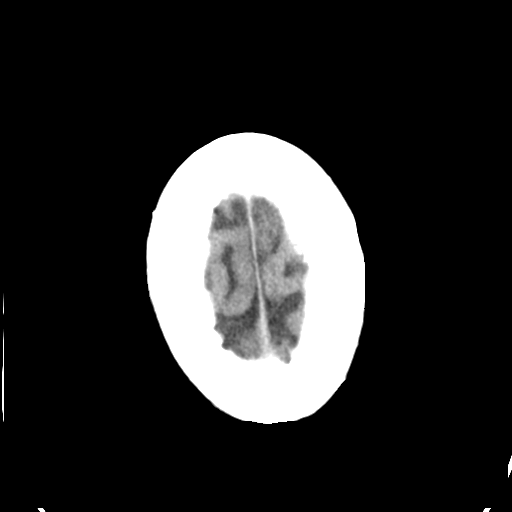

[Series 4: head bone · axial · 0.43mm/px · z∈[-41,-23]mm · 2 of 86 slices shown]
[im 9/86  bone]
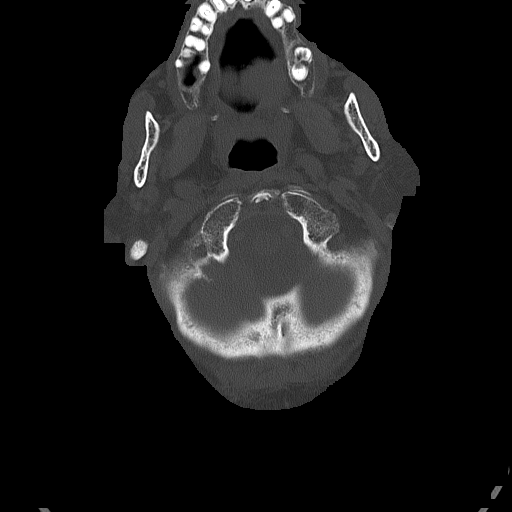
[im 18/86  bone]
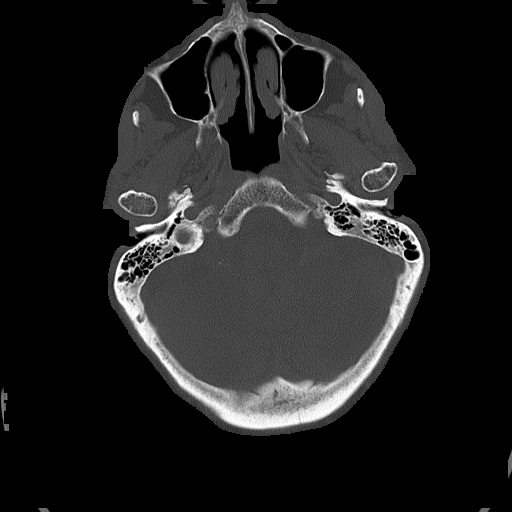

[Series 5: head without cor · coronal · non-contrast · 0.31mm/px · 3 of 73 slices shown]
[im 25/73  brain]
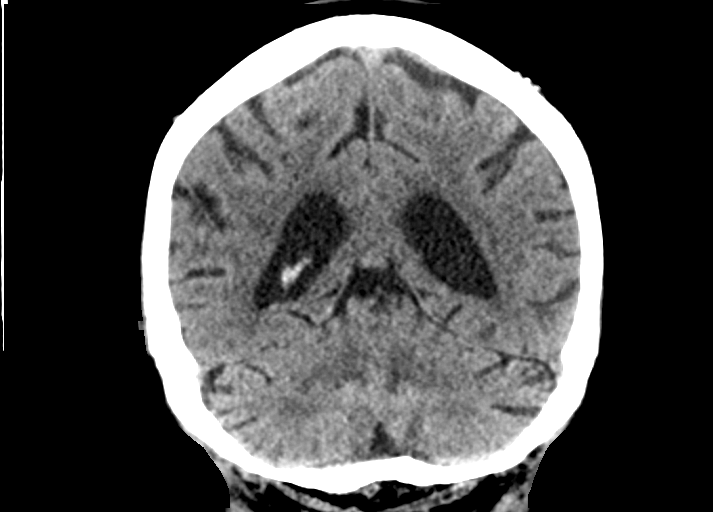
[im 33/73  brain]
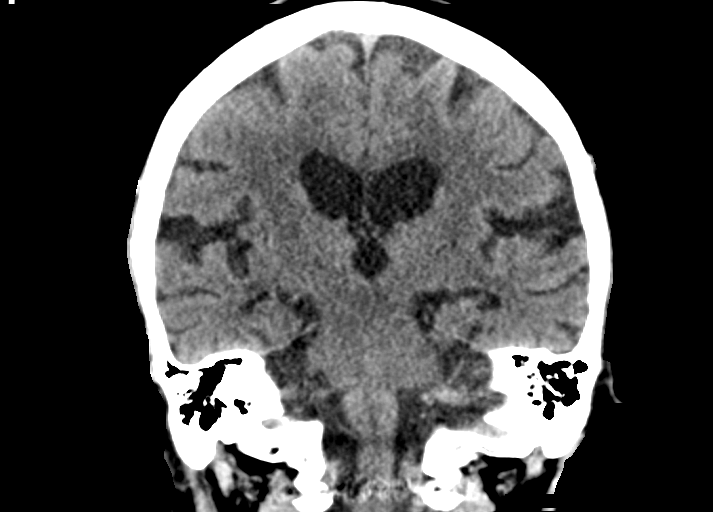
[im 41/73  brain]
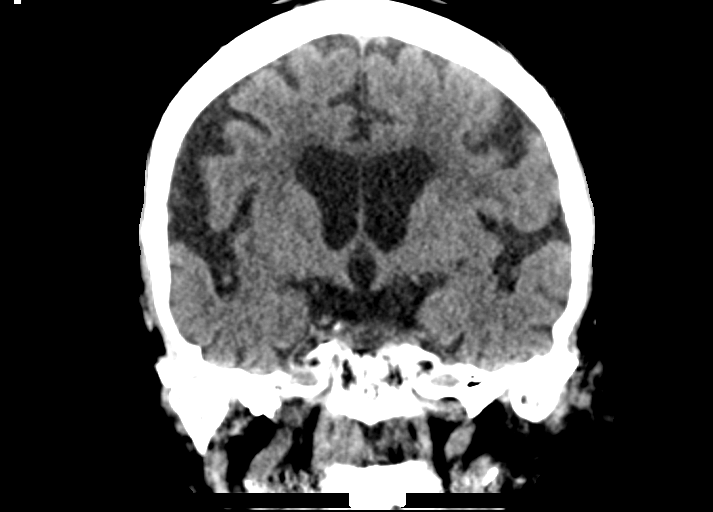

[Series 6: head without sag · sagittal · non-contrast · 0.33mm/px · 3 of 67 slices shown]
[im 23/67  brain]
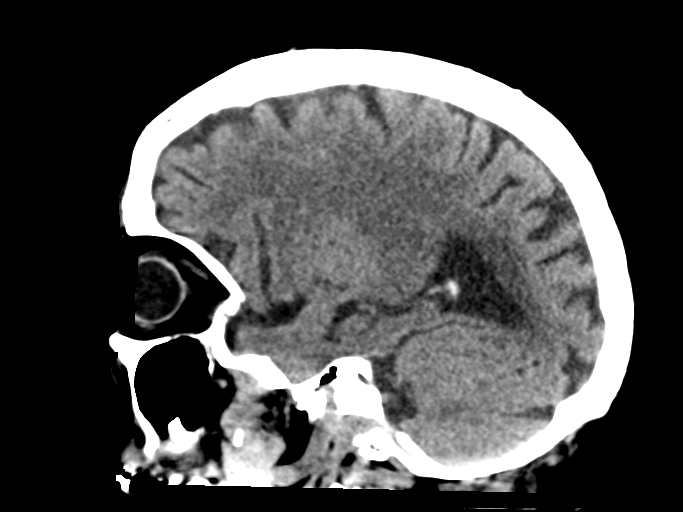
[im 34/67  brain]
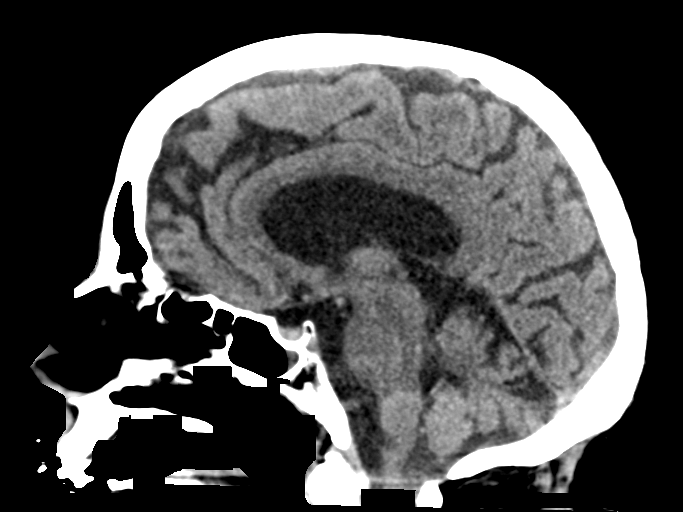
[im 45/67  brain]
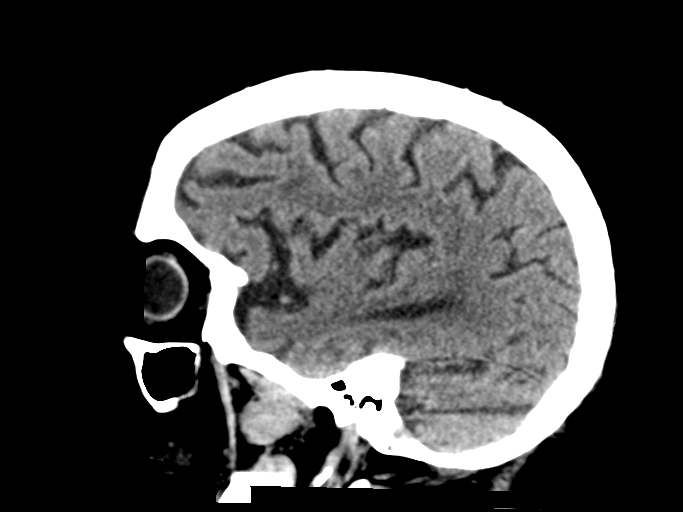

[15 of 47 positions shown; findings below may reference images not displayed]

FINDINGS: CT HEAD FINDINGS

Brain: Mild chronic ischemic white matter disease is noted. No mass
effect or midline shift is noted. Ventricular size is within normal
limits. There is no evidence of mass lesion, hemorrhage or acute
infarction.

Vascular: No hyperdense vessel or unexpected calcification.

Skull: Normal. Negative for fracture or focal lesion.

Sinuses/Orbits: No acute finding.

Other: None.

CT CERVICAL SPINE FINDINGS

Alignment: Minimal grade 1 anterolisthesis of C3-4 and C4-5 is noted
secondary to posterior facet joint hypertrophy.

Skull base and vertebrae: No acute fracture. No primary bone lesion
or focal pathologic process.

Soft tissues and spinal canal: No prevertebral fluid or swelling. No
visible canal hematoma.

Disc levels: Status post surgical anterior fusion of C6-7 is noted.
Severe degenerative disc disease is noted at C5-6, C7-T1 and T1-2.

Upper chest: Negative.

Other: Degenerative changes are seen involving posterior facet
joints bilaterally.
IMPRESSION: Mild chronic ischemic white matter disease. No acute intracranial
abnormality seen.

Multilevel postsurgical and degenerative changes are noted in the
cervical spine. No fracture or other acute abnormality is noted.

## 2021-05-09 DEATH — deceased
